# Patient Record
Sex: Male | Born: 1965 | Race: White | Hispanic: No | Marital: Married | Smoking: Former smoker
Health system: Southern US, Community
[De-identification: ages and names within clinical notes are randomized; demographics above are authoritative.]

## PROBLEM LIST (undated history)

## (undated) DIAGNOSIS — H04123 Dry eye syndrome of bilateral lacrimal glands: Secondary | ICD-10-CM

## (undated) DIAGNOSIS — M1990 Unspecified osteoarthritis, unspecified site: Secondary | ICD-10-CM

## (undated) DIAGNOSIS — R112 Nausea with vomiting, unspecified: Secondary | ICD-10-CM

## (undated) DIAGNOSIS — K5120 Ulcerative (chronic) proctitis without complications: Secondary | ICD-10-CM

## (undated) DIAGNOSIS — E559 Vitamin D deficiency, unspecified: Secondary | ICD-10-CM

## (undated) DIAGNOSIS — Z9889 Other specified postprocedural states: Secondary | ICD-10-CM

## (undated) DIAGNOSIS — J45909 Unspecified asthma, uncomplicated: Secondary | ICD-10-CM

## (undated) DIAGNOSIS — R519 Headache, unspecified: Secondary | ICD-10-CM

## (undated) DIAGNOSIS — K2270 Barrett's esophagus without dysplasia: Secondary | ICD-10-CM

## (undated) DIAGNOSIS — K648 Other hemorrhoids: Secondary | ICD-10-CM

## (undated) DIAGNOSIS — A4902 Methicillin resistant Staphylococcus aureus infection, unspecified site: Secondary | ICD-10-CM

## (undated) DIAGNOSIS — B1910 Unspecified viral hepatitis B without hepatic coma: Secondary | ICD-10-CM

## (undated) DIAGNOSIS — I10 Essential (primary) hypertension: Secondary | ICD-10-CM

## (undated) DIAGNOSIS — F419 Anxiety disorder, unspecified: Secondary | ICD-10-CM

## (undated) DIAGNOSIS — N342 Other urethritis: Secondary | ICD-10-CM

## (undated) DIAGNOSIS — E039 Hypothyroidism, unspecified: Secondary | ICD-10-CM

## (undated) DIAGNOSIS — K219 Gastro-esophageal reflux disease without esophagitis: Secondary | ICD-10-CM

## (undated) HISTORY — DX: Dry eye syndrome of bilateral lacrimal glands: H04.123

## (undated) HISTORY — DX: Hypothyroidism, unspecified: E03.9

## (undated) HISTORY — PX: COLONOSCOPY: SHX174

## (undated) HISTORY — PX: ESOPHAGOGASTRODUODENOSCOPY: SHX1529

## (undated) HISTORY — DX: Other hemorrhoids: K64.8

## (undated) HISTORY — DX: Other urethritis: N34.2

## (undated) HISTORY — PX: HEMORRHOID BANDING: SHX5850

## (undated) HISTORY — DX: Ulcerative (chronic) proctitis without complications: K51.20

## (undated) HISTORY — DX: Gastro-esophageal reflux disease without esophagitis: K21.9

## (undated) HISTORY — PX: FACIAL COSMETIC SURGERY: SHX629

## (undated) HISTORY — DX: Barrett's esophagus without dysplasia: K22.70

## (undated) HISTORY — DX: Anxiety disorder, unspecified: F41.9

## (undated) HISTORY — DX: Unspecified asthma, uncomplicated: J45.909

## (undated) HISTORY — DX: Vitamin D deficiency, unspecified: E55.9

## (undated) HISTORY — PX: LIPOSUCTION: SHX10

## (undated) HISTORY — DX: Unspecified viral hepatitis B without hepatic coma: B19.10

---

## 1998-07-25 ENCOUNTER — Ambulatory Visit (HOSPITAL_BASED_OUTPATIENT_CLINIC_OR_DEPARTMENT_OTHER): Admission: RE | Admit: 1998-07-25 | Discharge: 1998-07-25 | Payer: Self-pay | Admitting: *Deleted

## 2002-10-19 LAB — HM COLONOSCOPY

## 2004-05-15 ENCOUNTER — Ambulatory Visit: Payer: Self-pay | Admitting: Internal Medicine

## 2004-05-29 ENCOUNTER — Ambulatory Visit: Payer: Self-pay | Admitting: Internal Medicine

## 2004-10-24 ENCOUNTER — Ambulatory Visit: Payer: Self-pay | Admitting: Internal Medicine

## 2004-10-27 ENCOUNTER — Ambulatory Visit: Payer: Self-pay | Admitting: Internal Medicine

## 2004-10-31 ENCOUNTER — Ambulatory Visit: Payer: Self-pay | Admitting: Internal Medicine

## 2004-11-01 ENCOUNTER — Ambulatory Visit: Payer: Self-pay | Admitting: Internal Medicine

## 2004-11-02 ENCOUNTER — Ambulatory Visit: Payer: Self-pay | Admitting: Internal Medicine

## 2004-11-09 ENCOUNTER — Ambulatory Visit: Payer: Self-pay | Admitting: Internal Medicine

## 2004-11-14 ENCOUNTER — Ambulatory Visit: Payer: Self-pay | Admitting: Internal Medicine

## 2004-11-20 ENCOUNTER — Ambulatory Visit: Payer: Self-pay | Admitting: Internal Medicine

## 2004-11-27 ENCOUNTER — Ambulatory Visit: Payer: Self-pay | Admitting: Internal Medicine

## 2004-12-11 ENCOUNTER — Ambulatory Visit: Payer: Self-pay | Admitting: Internal Medicine

## 2005-01-01 ENCOUNTER — Ambulatory Visit: Payer: Self-pay | Admitting: Internal Medicine

## 2005-03-05 ENCOUNTER — Ambulatory Visit: Payer: Self-pay | Admitting: Internal Medicine

## 2005-03-12 ENCOUNTER — Ambulatory Visit: Payer: Self-pay | Admitting: Internal Medicine

## 2005-05-14 ENCOUNTER — Ambulatory Visit: Payer: Self-pay | Admitting: Internal Medicine

## 2005-05-22 ENCOUNTER — Ambulatory Visit: Payer: Self-pay | Admitting: Internal Medicine

## 2005-11-19 ENCOUNTER — Ambulatory Visit: Payer: Self-pay | Admitting: Internal Medicine

## 2005-12-03 ENCOUNTER — Ambulatory Visit: Payer: Self-pay | Admitting: Internal Medicine

## 2005-12-17 ENCOUNTER — Ambulatory Visit: Payer: Self-pay | Admitting: Internal Medicine

## 2006-06-18 DIAGNOSIS — B1910 Unspecified viral hepatitis B without hepatic coma: Secondary | ICD-10-CM

## 2006-06-18 HISTORY — DX: Unspecified viral hepatitis B without hepatic coma: B19.10

## 2006-11-25 ENCOUNTER — Ambulatory Visit: Payer: Self-pay | Admitting: Internal Medicine

## 2006-11-26 ENCOUNTER — Encounter: Payer: Self-pay | Admitting: Internal Medicine

## 2006-12-02 ENCOUNTER — Ambulatory Visit: Payer: Self-pay | Admitting: Internal Medicine

## 2006-12-02 LAB — CONVERTED CEMR LAB
Alkaline Phosphatase: 46 units/L (ref 39–117)
Basophils Absolute: 0 10*3/uL (ref 0.0–0.1)
Bilirubin Urine: NEGATIVE
Eosinophils Absolute: 0.1 10*3/uL (ref 0.0–0.6)
Eosinophils Relative: 3.1 % (ref 0.0–5.0)
Glucose, Bld: 88 mg/dL (ref 70–99)
HCT: 40.7 % (ref 39.0–52.0)
MCHC: 35 g/dL (ref 30.0–36.0)
Monocytes Absolute: 0.3 10*3/uL (ref 0.2–0.7)
Monocytes Relative: 7.9 % (ref 3.0–11.0)
Platelets: 208 10*3/uL (ref 150–400)
Potassium: 4.5 meq/L (ref 3.5–5.1)
RDW: 11.9 % (ref 11.5–14.6)
Specific Gravity, Urine: >=1.03 (ref 1.000–1.03)
TSH: 1.36 microintl units/mL (ref 0.35–5.50)
Total Bilirubin: 1.1 mg/dL (ref 0.3–1.2)
Total Protein, Urine: NEGATIVE mg/dL
Urine Glucose: NEGATIVE mg/dL
WBC: 4.1 10*3/uL — ABNORMAL LOW (ref 4.5–10.5)
pH: 6 (ref 5.0–8.0)

## 2006-12-09 ENCOUNTER — Ambulatory Visit: Payer: Self-pay | Admitting: Internal Medicine

## 2007-01-21 ENCOUNTER — Encounter: Payer: Self-pay | Admitting: Internal Medicine

## 2007-01-21 DIAGNOSIS — J309 Allergic rhinitis, unspecified: Secondary | ICD-10-CM | POA: Insufficient documentation

## 2007-01-21 DIAGNOSIS — K439 Ventral hernia without obstruction or gangrene: Secondary | ICD-10-CM | POA: Insufficient documentation

## 2007-01-21 DIAGNOSIS — B1910 Unspecified viral hepatitis B without hepatic coma: Secondary | ICD-10-CM | POA: Insufficient documentation

## 2007-01-21 DIAGNOSIS — B957 Other staphylococcus as the cause of diseases classified elsewhere: Secondary | ICD-10-CM | POA: Insufficient documentation

## 2007-01-21 DIAGNOSIS — K429 Umbilical hernia without obstruction or gangrene: Secondary | ICD-10-CM | POA: Insufficient documentation

## 2007-05-30 ENCOUNTER — Telehealth: Payer: Self-pay | Admitting: Internal Medicine

## 2007-06-19 HISTORY — PX: TEAR DUCT PROBING: SHX793

## 2007-09-29 ENCOUNTER — Encounter: Payer: Self-pay | Admitting: Internal Medicine

## 2007-11-03 ENCOUNTER — Telehealth: Payer: Self-pay | Admitting: Internal Medicine

## 2007-12-08 ENCOUNTER — Ambulatory Visit: Payer: Self-pay | Admitting: Internal Medicine

## 2007-12-09 LAB — CONVERTED CEMR LAB
Albumin: 4.1 g/dL (ref 3.5–5.2)
Bilirubin, Direct: 0.2 mg/dL (ref 0.0–0.3)
CO2: 31 meq/L (ref 19–32)
Creatinine, Ser: 1 mg/dL (ref 0.4–1.5)
Glucose, Bld: 96 mg/dL (ref 70–99)
HCT: 42.6 % (ref 39.0–52.0)
Hemoglobin, Urine: NEGATIVE
Hemoglobin: 14.8 g/dL (ref 13.0–17.0)
Ketones, ur: NEGATIVE mg/dL
LDL Cholesterol: 107 mg/dL — ABNORMAL HIGH (ref 0–99)
Lymphocytes Relative: 37.6 % (ref 12.0–46.0)
MCV: 92.1 fL (ref 78.0–100.0)
Monocytes Relative: 7.7 % (ref 3.0–12.0)
Total Bilirubin: 1.1 mg/dL (ref 0.3–1.2)
Total Protein, Urine: NEGATIVE mg/dL
Total Protein: 7 g/dL (ref 6.0–8.3)
Triglycerides: 31 mg/dL (ref 0–149)
Urine Glucose: NEGATIVE mg/dL
Urobilinogen, UA: 0.2 (ref 0.0–1.0)
VLDL: 6 mg/dL (ref 0–40)
WBC: 3.4 10*3/uL — ABNORMAL LOW (ref 4.5–10.5)

## 2007-12-11 LAB — CONVERTED CEMR LAB: Hep B S Ab: POSITIVE — AB

## 2007-12-17 ENCOUNTER — Telehealth (INDEPENDENT_AMBULATORY_CARE_PROVIDER_SITE_OTHER): Payer: Self-pay | Admitting: *Deleted

## 2007-12-29 ENCOUNTER — Ambulatory Visit: Payer: Self-pay | Admitting: Sports Medicine

## 2007-12-29 DIAGNOSIS — M25569 Pain in unspecified knee: Secondary | ICD-10-CM | POA: Insufficient documentation

## 2008-01-08 ENCOUNTER — Ambulatory Visit: Payer: Self-pay | Admitting: Internal Medicine

## 2008-05-25 ENCOUNTER — Telehealth: Payer: Self-pay | Admitting: Internal Medicine

## 2008-06-18 DIAGNOSIS — N342 Other urethritis: Secondary | ICD-10-CM

## 2008-06-18 DIAGNOSIS — E039 Hypothyroidism, unspecified: Secondary | ICD-10-CM

## 2008-06-18 HISTORY — DX: Other urethritis: N34.2

## 2008-06-18 HISTORY — DX: Hypothyroidism, unspecified: E03.9

## 2008-08-23 ENCOUNTER — Ambulatory Visit: Payer: Self-pay | Admitting: Internal Medicine

## 2008-08-23 DIAGNOSIS — M549 Dorsalgia, unspecified: Secondary | ICD-10-CM | POA: Insufficient documentation

## 2008-08-23 LAB — CONVERTED CEMR LAB
BUN: 16 mg/dL (ref 6–23)
Calcium: 9.5 mg/dL (ref 8.4–10.5)
Creatinine, Ser: 0.9 mg/dL (ref 0.4–1.5)
Eosinophils Relative: 2.5 % (ref 0.0–5.0)
GFR calc Af Amer: 118 mL/min
GFR calc non Af Amer: 98 mL/min
HCT: 43.4 % (ref 39.0–52.0)
Ketones, ur: NEGATIVE mg/dL
Leukocytes, UA: NEGATIVE
MCHC: 35.2 g/dL (ref 30.0–36.0)
Monocytes Absolute: 0.3 10*3/uL (ref 0.1–1.0)
Nitrite: NEGATIVE
Potassium: 5 meq/L (ref 3.5–5.1)
RBC: 4.66 M/uL (ref 4.22–5.81)
Specific Gravity, Urine: 1.01 (ref 1.000–1.035)
Urine Glucose: NEGATIVE mg/dL
WBC: 3.9 10*3/uL — ABNORMAL LOW (ref 4.5–10.5)

## 2008-08-30 ENCOUNTER — Ambulatory Visit: Payer: Self-pay | Admitting: Cardiology

## 2008-09-06 ENCOUNTER — Encounter: Payer: Self-pay | Admitting: Internal Medicine

## 2008-09-09 ENCOUNTER — Telehealth: Payer: Self-pay | Admitting: Internal Medicine

## 2008-09-13 ENCOUNTER — Telehealth (INDEPENDENT_AMBULATORY_CARE_PROVIDER_SITE_OTHER): Payer: Self-pay | Admitting: *Deleted

## 2008-09-15 ENCOUNTER — Encounter: Admission: RE | Admit: 2008-09-15 | Discharge: 2008-09-15 | Payer: Self-pay | Admitting: Internal Medicine

## 2008-10-14 ENCOUNTER — Telehealth: Payer: Self-pay | Admitting: Internal Medicine

## 2008-10-25 ENCOUNTER — Ambulatory Visit: Payer: Self-pay | Admitting: Internal Medicine

## 2008-10-25 DIAGNOSIS — E039 Hypothyroidism, unspecified: Secondary | ICD-10-CM | POA: Insufficient documentation

## 2008-10-27 ENCOUNTER — Telehealth: Payer: Self-pay | Admitting: Internal Medicine

## 2008-11-02 ENCOUNTER — Telehealth: Payer: Self-pay | Admitting: Internal Medicine

## 2008-11-10 ENCOUNTER — Telehealth: Payer: Self-pay | Admitting: Internal Medicine

## 2008-11-18 ENCOUNTER — Telehealth: Payer: Self-pay | Admitting: Internal Medicine

## 2008-11-22 ENCOUNTER — Encounter: Payer: Self-pay | Admitting: Internal Medicine

## 2008-12-13 ENCOUNTER — Ambulatory Visit: Payer: Self-pay | Admitting: Sports Medicine

## 2009-01-04 ENCOUNTER — Telehealth (INDEPENDENT_AMBULATORY_CARE_PROVIDER_SITE_OTHER): Payer: Self-pay | Admitting: *Deleted

## 2009-01-10 ENCOUNTER — Ambulatory Visit: Payer: Self-pay | Admitting: Internal Medicine

## 2009-01-10 LAB — CONVERTED CEMR LAB
Basophils Absolute: 0 10*3/uL (ref 0.0–0.1)
Cholesterol: 180 mg/dL (ref 0–200)
Eosinophils Absolute: 0.1 10*3/uL (ref 0.0–0.7)
Eosinophils Relative: 3.2 % (ref 0.0–5.0)
GFR calc non Af Amer: 86.32 mL/min (ref >60)
HCT: 44.3 % (ref 39.0–52.0)
HDL: 64.3 mg/dL (ref >39.00)
Hemoglobin, Urine: NEGATIVE
Ketones, ur: NEGATIVE mg/dL
LDL Cholesterol: 111 mg/dL — ABNORMAL HIGH (ref 0–99)
Lymphocytes Relative: 41.8 % (ref 12.0–46.0)
Lymphs Abs: 1.3 10*3/uL (ref 0.7–4.0)
Monocytes Relative: 8.4 % (ref 3.0–12.0)
Neutro Abs: 1.5 10*3/uL (ref 1.4–7.7)
Neutrophils Relative %: 46 % (ref 43.0–77.0)
Nitrite: NEGATIVE
Potassium: 4.2 meq/L (ref 3.5–5.1)
RBC: 4.71 M/uL (ref 4.22–5.81)
Sodium: 141 meq/L (ref 135–145)
TSH: 0.56 microintl units/mL (ref 0.35–5.50)
Total CHOL/HDL Ratio: 3
Total Protein, Urine: NEGATIVE mg/dL
Triglycerides: 24 mg/dL (ref 0.0–149.0)
VLDL: 4.8 mg/dL (ref 0.0–40.0)

## 2009-01-17 ENCOUNTER — Ambulatory Visit: Payer: Self-pay | Admitting: Internal Medicine

## 2009-01-20 ENCOUNTER — Telehealth: Payer: Self-pay | Admitting: Internal Medicine

## 2009-02-24 ENCOUNTER — Ambulatory Visit: Payer: Self-pay | Admitting: Internal Medicine

## 2009-02-24 DIAGNOSIS — F411 Generalized anxiety disorder: Secondary | ICD-10-CM | POA: Insufficient documentation

## 2009-02-24 DIAGNOSIS — F419 Anxiety disorder, unspecified: Secondary | ICD-10-CM | POA: Insufficient documentation

## 2009-02-25 LAB — CONVERTED CEMR LAB
Cholesterol: 193 mg/dL (ref 0–200)
HDL: 65 mg/dL (ref >39.00)
LDL Cholesterol: 120 mg/dL — ABNORMAL HIGH (ref 0–99)
Total CHOL/HDL Ratio: 3
VLDL: 8 mg/dL (ref 0.0–40.0)

## 2009-03-09 ENCOUNTER — Ambulatory Visit: Payer: Self-pay | Admitting: Internal Medicine

## 2009-03-09 DIAGNOSIS — J45909 Unspecified asthma, uncomplicated: Secondary | ICD-10-CM | POA: Insufficient documentation

## 2009-03-09 DIAGNOSIS — R0609 Other forms of dyspnea: Secondary | ICD-10-CM

## 2009-03-09 DIAGNOSIS — R0989 Other specified symptoms and signs involving the circulatory and respiratory systems: Secondary | ICD-10-CM

## 2009-03-16 ENCOUNTER — Ambulatory Visit: Payer: Self-pay | Admitting: Sports Medicine

## 2009-03-16 DIAGNOSIS — M2140 Flat foot [pes planus] (acquired), unspecified foot: Secondary | ICD-10-CM | POA: Insufficient documentation

## 2009-05-16 ENCOUNTER — Ambulatory Visit: Payer: Self-pay | Admitting: Internal Medicine

## 2009-09-13 ENCOUNTER — Ambulatory Visit: Payer: Self-pay | Admitting: Internal Medicine

## 2009-09-13 DIAGNOSIS — K219 Gastro-esophageal reflux disease without esophagitis: Secondary | ICD-10-CM | POA: Insufficient documentation

## 2009-09-14 LAB — CONVERTED CEMR LAB
Bilirubin Urine: NEGATIVE
Leukocytes, UA: NEGATIVE
Specific Gravity, Urine: 1.015 (ref 1.000–1.030)
Total Protein, Urine: NEGATIVE mg/dL
Urine Glucose: NEGATIVE mg/dL

## 2009-09-19 ENCOUNTER — Encounter: Admission: RE | Admit: 2009-09-19 | Discharge: 2009-09-19 | Payer: Self-pay | Admitting: Internal Medicine

## 2009-11-09 ENCOUNTER — Telehealth: Payer: Self-pay | Admitting: Internal Medicine

## 2009-11-16 ENCOUNTER — Telehealth: Payer: Self-pay | Admitting: Internal Medicine

## 2009-11-24 ENCOUNTER — Telehealth: Payer: Self-pay | Admitting: Internal Medicine

## 2009-11-24 ENCOUNTER — Ambulatory Visit: Payer: Self-pay | Admitting: Internal Medicine

## 2009-11-24 DIAGNOSIS — B009 Herpesviral infection, unspecified: Secondary | ICD-10-CM | POA: Insufficient documentation

## 2009-11-24 DIAGNOSIS — F329 Major depressive disorder, single episode, unspecified: Secondary | ICD-10-CM

## 2009-11-24 DIAGNOSIS — F3289 Other specified depressive episodes: Secondary | ICD-10-CM | POA: Insufficient documentation

## 2009-11-24 DIAGNOSIS — F410 Panic disorder [episodic paroxysmal anxiety] without agoraphobia: Secondary | ICD-10-CM | POA: Insufficient documentation

## 2009-11-25 ENCOUNTER — Telehealth: Payer: Self-pay | Admitting: Internal Medicine

## 2009-11-28 ENCOUNTER — Ambulatory Visit: Payer: Self-pay | Admitting: Internal Medicine

## 2009-11-28 ENCOUNTER — Telehealth: Payer: Self-pay | Admitting: Internal Medicine

## 2009-11-28 LAB — CONVERTED CEMR LAB: Glucose, Bld: 46 mg/dL — CL (ref 70–99)

## 2009-11-29 ENCOUNTER — Telehealth: Payer: Self-pay | Admitting: Internal Medicine

## 2009-12-01 ENCOUNTER — Telehealth: Payer: Self-pay | Admitting: Internal Medicine

## 2009-12-01 ENCOUNTER — Ambulatory Visit: Payer: Self-pay | Admitting: Internal Medicine

## 2009-12-01 DIAGNOSIS — E291 Testicular hypofunction: Secondary | ICD-10-CM | POA: Insufficient documentation

## 2009-12-01 DIAGNOSIS — M545 Low back pain, unspecified: Secondary | ICD-10-CM | POA: Insufficient documentation

## 2009-12-01 DIAGNOSIS — E162 Hypoglycemia, unspecified: Secondary | ICD-10-CM

## 2009-12-02 ENCOUNTER — Ambulatory Visit: Payer: Self-pay | Admitting: Internal Medicine

## 2009-12-07 LAB — CONVERTED CEMR LAB
ALT: 13 units/L (ref 0–53)
Bilirubin, Direct: 0.1 mg/dL (ref 0.0–0.3)
Creatinine, Ser: 0.9 mg/dL (ref 0.4–1.5)
Glucose, Bld: 95 mg/dL (ref 70–99)
Hgb A1c MFr Bld: 5.3 % (ref 4.6–6.5)
Total Bilirubin: 1.1 mg/dL (ref 0.3–1.2)
Total Protein: 6.7 g/dL (ref 6.0–8.3)

## 2010-01-16 ENCOUNTER — Ambulatory Visit: Payer: Self-pay | Admitting: Internal Medicine

## 2010-01-16 LAB — CONVERTED CEMR LAB
AST: 20 units/L (ref 0–37)
Albumin: 4.1 g/dL (ref 3.5–5.2)
BUN: 17 mg/dL (ref 6–23)
Basophils Absolute: 0 10*3/uL (ref 0.0–0.1)
Basophils Relative: 0.6 % (ref 0.0–3.0)
Bilirubin, Direct: 0.2 mg/dL (ref 0.0–0.3)
Chloride: 106 meq/L (ref 96–112)
Eosinophils Absolute: 0.1 10*3/uL (ref 0.0–0.7)
GFR calc non Af Amer: 105.07 mL/min (ref >60)
HCT: 43 % (ref 39.0–52.0)
Hemoglobin: 15.2 g/dL (ref 13.0–17.0)
Ketones, ur: NEGATIVE mg/dL
Lymphs Abs: 1.5 10*3/uL (ref 0.7–4.0)
MCV: 92.9 fL (ref 78.0–100.0)
Monocytes Absolute: 0.3 10*3/uL (ref 0.1–1.0)
Neutro Abs: 2.2 10*3/uL (ref 1.4–7.7)
Nitrite: NEGATIVE
PSA: 0.51 ng/mL (ref 0.10–4.00)
Platelets: 193 10*3/uL (ref 150.0–400.0)
Potassium: 4.5 meq/L (ref 3.5–5.1)
RBC: 4.63 M/uL (ref 4.22–5.81)
Sodium: 140 meq/L (ref 135–145)
Total Bilirubin: 1.2 mg/dL (ref 0.3–1.2)
Total CHOL/HDL Ratio: 3
Total Protein, Urine: NEGATIVE mg/dL
Urine Glucose: NEGATIVE mg/dL
Urobilinogen, UA: 0.2 (ref 0.0–1.0)
WBC: 4.1 10*3/uL — ABNORMAL LOW (ref 4.5–10.5)

## 2010-01-23 ENCOUNTER — Encounter: Payer: Self-pay | Admitting: Internal Medicine

## 2010-01-23 ENCOUNTER — Ambulatory Visit: Payer: Self-pay | Admitting: Internal Medicine

## 2010-04-17 ENCOUNTER — Telehealth: Payer: Self-pay | Admitting: Internal Medicine

## 2010-04-19 ENCOUNTER — Ambulatory Visit: Payer: Self-pay | Admitting: Internal Medicine

## 2010-04-19 LAB — CONVERTED CEMR LAB
HCV Ab: NEGATIVE
Hep B C IgM: NEGATIVE
Hepatitis B Surface Ag: NEGATIVE

## 2010-05-01 ENCOUNTER — Telehealth: Payer: Self-pay | Admitting: Internal Medicine

## 2010-05-03 ENCOUNTER — Encounter: Payer: Self-pay | Admitting: Internal Medicine

## 2010-05-22 ENCOUNTER — Ambulatory Visit: Payer: Self-pay | Admitting: Internal Medicine

## 2010-05-22 DIAGNOSIS — K649 Unspecified hemorrhoids: Secondary | ICD-10-CM | POA: Insufficient documentation

## 2010-05-22 DIAGNOSIS — K921 Melena: Secondary | ICD-10-CM

## 2010-05-22 DIAGNOSIS — K6289 Other specified diseases of anus and rectum: Secondary | ICD-10-CM

## 2010-05-29 ENCOUNTER — Telehealth: Payer: Self-pay | Admitting: Internal Medicine

## 2010-06-13 ENCOUNTER — Ambulatory Visit: Payer: Self-pay | Admitting: Internal Medicine

## 2010-06-13 DIAGNOSIS — R1084 Generalized abdominal pain: Secondary | ICD-10-CM | POA: Insufficient documentation

## 2010-06-15 LAB — CONVERTED CEMR LAB
Blood, UA: NEGATIVE
Ketones, ur: NEGATIVE mg/dL
Total Protein, Urine: NEGATIVE mg/dL
Urine Glucose: NEGATIVE mg/dL
pH: 7 (ref 5.0–8.0)

## 2010-06-18 DIAGNOSIS — Z860101 Personal history of adenomatous and serrated colon polyps: Secondary | ICD-10-CM

## 2010-06-18 DIAGNOSIS — Z86010 Personal history of colonic polyps: Secondary | ICD-10-CM

## 2010-06-18 HISTORY — DX: Personal history of colonic polyps: Z86.010

## 2010-06-18 HISTORY — DX: Personal history of adenomatous and serrated colon polyps: Z86.0101

## 2010-06-21 ENCOUNTER — Telehealth: Payer: Self-pay | Admitting: Internal Medicine

## 2010-07-04 ENCOUNTER — Telehealth: Payer: Self-pay | Admitting: Internal Medicine

## 2010-07-18 NOTE — Assessment & Plan Note (Signed)
Summary: LOW GLUCOSE--PER KELLY SCHED--STC   Vital Signs:  Patient profile:   45 year old male Height:      73 inches Weight:      182.25 pounds BMI:     24.13 O2 Sat:      96 % on Room air Temp:     97.6 degrees F oral Pulse rate:   77 / minute BP sitting:   134 / 62  (left arm) Cuff size:   regular  Vitals Entered By: Lucious Groves (December 01, 2009 11:08 AM)  O2 Flow:  Room air CC: OV per MD--low glucose./kb Is Patient Diabetic? No Pain Assessment Patient in pain? no      Comments Patient notes that he is not taking Loratadine or Proair./kb   Primary Care Provider:  Tresa Garter MD  CC:  OV per MD--low glucose./kb.  History of Present Illness: C/o dizzy spells at times "low CBG". C/o R flank pain, anxiety, stress C/o depressed mood, poor focus  Current Medications (verified): 1)  Nexium 40 Mg Cpdr (Esomeprazole Magnesium) .... Take 1 Tab Each Morning 2)  Restasis 0.05 % Emul (Cyclosporine) .... Two Times A Day in B Eyes 3)  Loratadine 10 Mg  Tabs (Loratadine) .... Once Daily As Needed Allergies 4)  Proair Hfa 108 (90 Base) Mcg/act  Aers (Albuterol Sulfate) .... 2 Inh Q4h As Needed Shortness of Breath 5)  Nasonex 50 Mcg/act Susp (Mometasone Furoate) .Marland Kitchen.. 1-2 Each Nostril Qd 6)  Meclizine Hcl 12.5 Mg Tabs (Meclizine Hcl) .Marland Kitchen.. 1 - 2 By Mouth Q 6 Hrs As Needed 7)  Alprazolam 0.25 Mg Tabs (Alprazolam) .Marland Kitchen.. 1po  Two Times A Day As Needed 8)  Sertraline Hcl 50 Mg Tabs (Sertraline Hcl) .... One By Mouth Once Daily  Allergies (verified): 1)  ! Codeine 2)  ! Penicillin  Past History:  Past Medical History: Last updated: 09/13/2009 Allergic rhinitis Hep B recovered 2008 Hypothyroidism 2010 Dr Raquel Sarna ? urethritis 2010 Dry eyes Anxiety Asthmatic bronchitis GERD  Past Surgical History: Last updated: 01/08/2008 Liposuction Face Lift Tear duct plugs 2009  Social History: Last updated: 11/24/2009 Single Occupation: hairdresser Former Smoker Regular  exercise-yes Drug use-no  Review of Systems  The patient denies fever, chest pain, syncope, dyspnea on exertion, and abdominal pain.    Physical Exam  General:  alert, well-developed, well-nourished, well-hydrated, appropriate dress, normal appearance, healthy-appearing, and cooperative to examination.   Nose:  External nasal examination shows no deformity or inflammation. Nasal mucosa are pink and moist without lesions or exudates. Mouth:  he has 3 excoriations along the right upper vermilion border with mild eryhtema but no exudate, induration, streaking, or vesicles Neck:  supple and no masses.   Lungs:  normal respiratory effort and normal breath sounds.   Heart:  normal rate and regular rhythm.   Abdomen:  Bowel sounds positive,abdomen soft and non-tender without masses, organomegaly. Small midline hernia noted. Msk:  normal ROM, no joint tenderness, no joint swelling, no joint warmth, no redness over joints, no joint deformities, no joint instability, and no crepitation.   Extremities:  No clubbing, cyanosis, edema, or deformity noted with normal full range of motion of all joints.   Neurologic:  No cranial nerve deficits noted. Station and gait are normal. Plantar reflexes are down-going bilaterally. DTRs are symmetrical throughout. Sensory, motor and coordinative functions appear intact. Skin:  turgor normal, no rashes, no ecchymoses, no petechiae, no purpura, no ulcerations, and no edema.   Psych:  Oriented X3, memory  intact for recent and remote, good eye contact, not agitated, not suicidal, not homicidal, dysphoric affect, tearful, and moderately anxious.     Impression & Recommendations:  Problem # 1:  HYPOGLYCEMIA (ICD-251.2) one incidental mesurement of low CBG Assessment New His CBGs at home were in 70-130 range - fasting and postprandial See "Patient Instructions" for labs. Within past 12 months he had a CT abd, [place] abd and LS MRI. He was asked to stop 5HTP, B12,   Turmeric  Problem # 2:  PANIC DISORDER (ICD-300.01) Assessment: Deteriorated  The following medications were removed from the medication list:    Sertraline Hcl 50 Mg Tabs (Sertraline hcl) ..... One by mouth once daily - he never took it His updated medication list for this problem includes:    Alprazolam 0.25 Mg Tabs (Alprazolam) .Marland Kitchen... 1po  two times a day as needed    Wellbutrin Sr 100 Mg Xr12h-tab (Bupropion hcl) .Marland Kitchen... 1 by mouth bid  Problem # 3:  DEPRESSIVE DISORDER (ICD-311) Assessment: Unchanged  The following medications were removed from the medication list:    Sertraline Hcl 50 Mg Tabs (Sertraline hcl) ..... One by mouth once daily His updated medication list for this problem includes:    Alprazolam 0.25 Mg Tabs (Alprazolam) .Marland Kitchen... 1po  two times a day as needed    Wellbutrin Sr 100 Mg Xr12h-tab (Bupropion hcl) .Marland Kitchen... 1 by mouth bid  Problem # 4:  LOW BACK PAIN, ACUTE (ICD-724.2) L flank Assessment: Deteriorated MSK due to postural issues at work See "Patient Instructions". He had a CT, [place], labs  Complete Medication List: 1)  Nexium 40 Mg Cpdr (Esomeprazole magnesium) .... Take 1 tab each morning 2)  Restasis 0.05 % Emul (Cyclosporine) .... Two times a day in b eyes 3)  Loratadine 10 Mg Tabs (Loratadine) .... Once daily as needed allergies 4)  Proair Hfa 108 (90 Base) Mcg/act Aers (Albuterol sulfate) .... 2 inh q4h as needed shortness of breath 5)  Nasonex 50 Mcg/act Susp (Mometasone furoate) .Marland Kitchen.. 1-2 each nostril qd 6)  Meclizine Hcl 12.5 Mg Tabs (Meclizine hcl) .Marland Kitchen.. 1 - 2 by mouth q 6 hrs as needed 7)  Alprazolam 0.25 Mg Tabs (Alprazolam) .Marland Kitchen.. 1po  two times a day as needed 8)  Wellbutrin Sr 100 Mg Xr12h-tab (Bupropion hcl) .Marland Kitchen.. 1 by mouth bid   Patient Instructions: 1)  Please schedule a follow-up appointment in 1 month. 2)  Can try a Valerian root 3)  Start taking a  yoga class - stretch 4)  Tomorrow: 5)  BMP prior to visit, ICD-9: 6)  Hepatic Panel prior to  visit, ICD-9: 7)  TSH prior to visit, ICD-9: 8)  HbgA1C prior to visit, ICD-9: 9)  Insulin level  995.20 Prescriptions: WELLBUTRIN SR 100 MG XR12H-TAB (BUPROPION HCL) 1 by mouth bid  #60 x 6   Entered and Authorized by:   Tresa Garter MD   Signed by:   Tresa Garter MD on 12/01/2009   Method used:   Electronically to        [facility]* (retail)       [address] [place], [place]  [address]       Ph: [phone]       Fax: [phone]   RxID:   [id number]

## 2010-07-18 NOTE — Progress Notes (Signed)
Summary: REQ FOR RX  Phone Note Call from Patient Call back at [facility] Phone [phone]   Summary of Call: Pt c/o sinus pressure, chest congestion w/ yellow mucus and low grade fever. He has been taking mucinex D and then tried sudafed w/little relief. Patient is requesting rx for antibiotic.  Initial call taken by: Lamar Sprinkles, CMA,  Nov 09, 2009 8:21 AM  Follow-up for Phone Call        ok z pac OV if sick Follow-up by: Tresa Garter MD,  Nov 09, 2009 12:51 PM  Additional Follow-up for Phone Call Additional follow up Details #1::        Pt informed  Additional Follow-up by: Lamar Sprinkles, CMA,  Nov 09, 2009 1:27 PM    New/Updated Medications: ZITHROMAX Z-PAK 250 MG TABS (AZITHROMYCIN) as dirrected Prescriptions: ZITHROMAX Z-PAK 250 MG TABS (AZITHROMYCIN) as dirrected  #1 x 0   Entered and Authorized by:   Tresa Garter MD   Signed by:   Lamar Sprinkles, CMA on 11/09/2009   Method used:   Electronically to        [facility]* (retail)       [address] [place], [place]  [address]       Ph: [phone]       Fax: [phone]   RxID:   [id number]

## 2010-07-18 NOTE — Progress Notes (Signed)
Summary: LOW CBGs  Phone Note Call from Patient   Summary of Call: Pt called back regarding low cbgs. He continues to be very concerned. Advised more frequent smaller meals. Also gave patient a glucometer and explained how to use it. He will check cbgs in am and other times when feeling badly. Pt will bring record in for office visit on thursday. Gave info from [facility] regarding hypoglycemia in non-diabetic patients.   Pt is very concerned as to cause of this and I advise him to discuss at office visit thursday, pt agreed.  Initial call taken by: Lamar Sprinkles, CMA,  November 29, 2009 12:15 PM  Follow-up for Phone Call        FYI- A1C could not be added, please order when pt is in for office visit if you want it.  Follow-up by: Lamar Sprinkles, CMA,  November 29, 2009 5:08 PM  Additional Follow-up for Phone Call Additional follow up Details #1::        OK Thx Additional Follow-up by: Tresa Garter MD,  November 29, 2009 5:30 PM

## 2010-07-18 NOTE — Progress Notes (Signed)
Summary: Test strips  Phone Note Call from Patient Call back at Home Phone [phone]   Summary of Call: Patient left message on triage requesting samples of one touch ultra test strips (code 25). I made patient aware that we do not have any in office. Per patient request prescription sent to [facility] Initial call taken by: Lucious Groves,  December 01, 2009 1:21 PM    New/Updated Medications: ONETOUCH ULTRA TEST  STRP (GLUCOSE BLOOD) use as directed Prescriptions: ONETOUCH ULTRA TEST  STRP (GLUCOSE BLOOD) use as directed  #100 x 0   Entered by:   Lucious Groves   Authorized by:   Tresa Garter MD   Signed by:   Lucious Groves on 12/01/2009   Method used:   Electronically to        [facility]* (retail)       [address] [place], [place]  [address]       Ph: [phone]       Fax: [phone]   RxID:   [id number]

## 2010-07-18 NOTE — Assessment & Plan Note (Signed)
Summary: BURNING-ITCHY-PAINFUL RECTUM --D/T--- STC   Vital Signs:  Patient profile:   45 year old male Height:      73 inches Weight:      189 pounds BMI:     25.03 Temp:     98.7 degrees F oral Pulse rate:   80 / minute Pulse rhythm:   regular Resp:     16 per minute BP sitting:   130 / 78  (left arm) Cuff size:   regular  Vitals Entered By: Lanier Prude, CMA(AAMA) (May 22, 2010 9:26 AM) CC: rectal pressure and some light blood in stools Is Patient Diabetic? No Comments pt is not taking Loratadine, Proair or nasonex.   Primary Care Provider:  Georgina Quint Plotnikov MD  CC:  rectal pressure and some light blood in stools.  History of Present Illness: C/o rectal discomfort x 3 wks w/some bleeding and stool diameter changes. It is 80% better after using Tuck supp. C/o anxiety - worse  Current Medications (verified): 1)  Nexium 40 Mg Cpdr (Esomeprazole Magnesium) .... Take 1 Tab Each Morning 2)  Restasis 0.05 % Emul (Cyclosporine) .... Two Times A Day in B Eyes 3)  Loratadine 10 Mg  Tabs (Loratadine) .... Once Daily As Needed Allergies 4)  Proair Hfa 108 (90 Base) Mcg/act  Aers (Albuterol Sulfate) .... 2 Inh Q4h As Needed Shortness of Breath 5)  Nasonex 50 Mcg/act Susp (Mometasone Furoate) .Marland Kitchen.. 1-2 Each Nostril Qd 6)  Meclizine Hcl 12.5 Mg Tabs (Meclizine Hcl) .Marland Kitchen.. 1 - 2 By Mouth Q 6 Hrs As Needed 7)  Alprazolam 0.25 Mg Tabs (Alprazolam) .Marland Kitchen.. 1po  Two Times A Day As Needed 8)  Onetouch Ultra Test  Strp (Glucose Blood) .... Use As Directed 9)  Nasonex 50 Mcg/act Susp (Mometasone Furoate) .Marland Kitchen.. 1-2 Spr Each Nostril Qd  Allergies (verified): 1)  ! Codeine 2)  ! Penicillin  Past History:  Past Medical History: Allergic rhinitis Hep B recovered 2008 Hypothyroidism 2010 Dr Raquel Sarna ? urethritis 2010 Dry eyes Anxiety Asthmatic bronchitis GERD Internal hemorrhoids  Past Surgical History: Liposuction Face Lift Tear duct plugs 2009 Colon 2004 Dr  Ewing Schlein  Physical Exam  General:  alert, well-developed, well-nourished, well-hydrated, appropriate dress, normal appearance, healthy-appearing, and cooperative to examination.   Nose:  External nasal examination shows no deformity or inflammation. Nasal mucosa are pink and moist without lesions or exudates. Mouth:  he has 3 excoriations along the right upper vermilion border with mild eryhtema but no exudate, induration, streaking, or vesicles Lungs:  normal respiratory effort and normal breath sounds.   Heart:  normal rate and regular rhythm.   Abdomen:  Bowel sounds positive,abdomen soft and non-tender without masses, organomegaly. Small midline hernia noted. Skin:  Intact without suspicious lesions or rashes Psych:  Oriented X3, memory intact for recent and remote, good eye contact, not agitated, not suicidal, not homicidal,    Impression & Recommendations:  Problem # 1:  RECTAL PAIN (ZOX-096.04) Assessment New  Procedure: Anoscopy Indication: Rectal bleeding Risks and benefits were explained. The pt. was placed in the R decubitus position. Digital rectal exam revealed no masses. Anoscope was introduced w/o difficulties. Upon withdrawl, a carefull look at the mucosa was obtained. Between 6 and 8 o'clock a   12x7     mm int hemorrhoid was present without active bleeding. Impression: Internal hemorrhoid. Disposition: see A&P.  Tolerated well. Complications: none.   Orders: Anoscopy (54098)  Problem # 2:  HEMORRHOIDS (ICD-455.6) Assessment: New See "Patient Instructions".  See Meds. Colonosc if issues  Problem # 3:  HEMATOCHEZIA (ICD-578.1) Assessment: New  as above  Orders: Anoscopy ([address])  Problem # 4:  ANXIETY (ICD-300.00)  His updated medication list for this problem includes:    Alprazolam 0.25 Mg Tabs (Alprazolam) .Marland Kitchen... 1po  two times a day as needed    Citalopram Hydrobromide 10 Mg Tabs (Citalopram hydrobromide) .Marland Kitchen... 1 by mouth once daily for depression  Complete  Medication List: 1)  Nexium 40 Mg Cpdr (Esomeprazole magnesium) .... Take 1 tab each morning 2)  Restasis 0.05 % Emul (Cyclosporine) .... Two times a day in b eyes 3)  Loratadine 10 Mg Tabs (Loratadine) .... Once daily as needed allergies 4)  Proair Hfa 108 (90 Base) Mcg/act Aers (Albuterol sulfate) .... 2 inh q4h as needed shortness of breath 5)  Nasonex 50 Mcg/act Susp (Mometasone furoate) .Marland Kitchen.. 1-2 each nostril qd 6)  Meclizine Hcl 12.5 Mg Tabs (Meclizine hcl) .Marland Kitchen.. 1 - 2 by mouth q 6 hrs as needed 7)  Alprazolam 0.25 Mg Tabs (Alprazolam) .Marland Kitchen.. 1po  two times a day as needed 8)  Onetouch Ultra Test Strp (Glucose blood) .... Use as directed 9)  Nasonex 50 Mcg/act Susp (Mometasone furoate) .Marland Kitchen.. 1-2 spr each nostril qd 10)  Doxycycline Hyclate 100 Mg Caps (Doxycycline hyclate) .Marland Kitchen.. 1 by mouth two times a day with a glass of water 11)  Anusol-hc 25 Mg Supp (Hydrocortisone acetate) .Marland Kitchen.. 1 pr two times a day for hemorrhoids 12)  Citalopram Hydrobromide 10 Mg Tabs (Citalopram hydrobromide) .Marland Kitchen.. 1 by mouth once daily for depression  Patient Instructions: 1)  Call if you are not better in a reasonable amount of time or if worse.  Prescriptions: CITALOPRAM HYDROBROMIDE 10 MG TABS (CITALOPRAM HYDROBROMIDE) 1 by mouth once daily for depression  #30 x 6   Entered and Authorized by:   Tresa Garter MD   Signed by:   Tresa Garter MD on 05/22/2010   Method used:   Electronically to        [facility]* (retail)       [address] [place], [place]  [address]       Ph: [phone]       Fax: [phone]   RxID:   [id number] ANUSOL-HC 25 MG SUPP (HYDROCORTISONE ACETATE) 1 pr two times a day for hemorrhoids  #20 x 3   Entered and Authorized by:   Tresa Garter MD   Signed by:   Tresa Garter MD on 05/22/2010   Method used:   Electronically to        [facility]* (retail)       [address] [place], [place]  [address]       Ph: [phone]       Fax: [phone]   RxID:   [id number] DOXYCYCLINE HYCLATE 100 MG CAPS (DOXYCYCLINE HYCLATE) 1 by mouth two times a day with a glass of water  #20 x 0   Entered and Authorized by:   Tresa Garter MD   Signed by:   Tresa Garter MD on 05/22/2010   Method used:   Electronically to        [facility]* (retail)       [address] [facility]  [place], [place]  [address]       Ph: [phone]       Fax: [phone]   RxID:   [id number]    Orders Added: 1)  Est. Patient Level IV [03474] 2)  Anoscopy [25956]

## 2010-07-18 NOTE — Progress Notes (Signed)
Summary: OV TODAY  Phone Note Call from Patient   Summary of Call: Pt c/o lightheadedness, arm weakness, disoriented somewhat from dizzyness and nervous about symptoms. Pt recently completed zpak for sinus infection. He c/o ear/facial pressure & pain. Is not taking any antihistamines or decongestants. Pt is scheduled for office visit w/Cris Gibby today at 11:15. Pt took 1/2 meclizine which he said gave some relief.  Advised pt to keep apt and call office w/any change in symptoms.  Initial call taken by: Lamar Sprinkles, CMA,  November 24, 2009 10:30 AM

## 2010-07-18 NOTE — Assessment & Plan Note (Signed)
Summary: PAIN IN LEFT SIDE/NWS   Vital Signs:  Patient profile:   45 year old male Weight:      178 pounds Temp:     98.6 degrees F oral Pulse rate:   83 / minute BP sitting:   116 / 64  (left arm)  Vitals Entered By: Tora Perches (September 13, 2009 4:21 PM) CC: left side pain Is Patient Diabetic? No   CC:  left side pain.  History of Present Illness: C/o LUQ pain x  8 wks ago worse w/drinking liquid like a pulled muscle. He is taking a lot of supplements w/coffe in am. No wt loss. He is very anxious about it.  Preventive Screening-Counseling & Management  Alcohol-Tobacco     Smoking Status: quit  Current Medications (verified): 1)  Nexium 40 Mg Cpdr (Esomeprazole Magnesium) .... Take 1 Tab Each Morning 2)  Ibuprofen 600 Mg  Tabs (Ibuprofen) .Marland Kitchen.. 1 By Mouth Two Times A Day As Needed Pain 3)  Restasis 0.05 % Emul (Cyclosporine) .... Two Times A Day in B Eyes 4)  Loratadine 10 Mg  Tabs (Loratadine) .... Once Daily As Needed Allergies 5)  Proair Hfa 108 (90 Base) Mcg/act  Aers (Albuterol Sulfate) .... 2 Inh Q4h As Needed Shortness of Breath 6)  Nasonex 50 Mcg/act Susp (Mometasone Furoate) .Marland Kitchen.. 1-2 Each Nostril Qd 7)  Meclizine Hcl 12.5 Mg Tabs (Meclizine Hcl) .Marland Kitchen.. 1 - 2 By Mouth Q 6 Hrs As Needed 8)  Alprazolam 0.25 Mg Tabs (Alprazolam) .Marland Kitchen.. 1po  Two Times A Day As Needed  Allergies: 1)  ! Codeine 2)  ! Penicillin  Past History:  Past Medical History: Allergic rhinitis Hep B recovered 2008 Hypothyroidism 2010 Dr Raquel Sarna ? urethritis 2010 Dry eyes Anxiety Asthmatic bronchitis GERD  Social History: Reviewed history from 05/16/2009 and no changes required. Single Occupation: hairdresser Former Smoker Regular exercise-yes  Review of Systems  The patient denies fever, weight loss, weight gain, chest pain, and melena.    Physical Exam  General:  alert and well-developed.   Mouth:  pharyngeal erythema and fair dentition.   Lungs:  normal respiratory effort  and normal breath sounds.   Heart:  normal rate and regular rhythm.   Abdomen:  Bowel sounds positive,abdomen soft and non-tender without masses, organomegaly. Small midline hernia noted. Msk:  WNL Neurologic:  cranial nerves II-XII intact, strength normal in all extremities, and finger-to-nose normal.  , no nystagmus Skin:  Intact without suspicious lesions or rashes Psych:  Oriented X3.     Impression & Recommendations:  Problem # 1:  ABDOMINAL PAIN, EPIGASTRIC (ICD-789.06)/LUQ - likely gastritis from vit/supplements Assessment New See "Patient Instructions".  Nexium 40 mg Orders: TLB-Udip ONLY (81003-UDIP) Radiology Referral (Radiology) abd [place]  Problem # 2:  ANXIETY (ICD-300.00) Assessment: Unchanged  His updated medication list for this problem includes:    Alprazolam 0.25 Mg Tabs (Alprazolam) .Marland Kitchen... 1po  two times a day as needed  Problem # 3:  HEPATITIS B (ICD-070.30) Assessment: Comment Only  Problem # 4:  GERD (ICD-530.81) related to #1 Assessment: Deteriorated  His updated medication list for this problem includes:    Nexium 40 Mg Cpdr (Esomeprazole magnesium) .Marland Kitchen... Take 1 tab each morning  Complete Medication List: 1)  Nexium 40 Mg Cpdr (Esomeprazole magnesium) .... Take 1 tab each morning 2)  Restasis 0.05 % Emul (Cyclosporine) .... Two times a day in b eyes 3)  Loratadine 10 Mg Tabs (Loratadine) .... Once daily as needed allergies 4)  Proair  Hfa 108 (90 Base) Mcg/act Aers (Albuterol sulfate) .... 2 inh q4h as needed shortness of breath 5)  Nasonex 50 Mcg/act Susp (Mometasone furoate) .Marland Kitchen.. 1-2 each nostril qd 6)  Meclizine Hcl 12.5 Mg Tabs (Meclizine hcl) .Marland Kitchen.. 1 - 2 by mouth q 6 hrs as needed 7)  Alprazolam 0.25 Mg Tabs (Alprazolam) .Marland Kitchen.. 1po  two times a day as needed  Patient Instructions: 1)  Stop your supplements and vitamins 2)  Take Nexium 40 mg by mouth two times a day x 2 wks then 1 a day 3)  Call if you are not better in a reasonable amount of time or  if worse.  Prescriptions: NEXIUM 40 MG CPDR (ESOMEPRAZOLE MAGNESIUM) Take 1 tab each morning  #30 x 6   Entered and Authorized by:   Tresa Garter MD   Signed by:   Tresa Garter MD on 09/13/2009   Method used:   Print then Give to Patient   RxID:   [id number]

## 2010-07-18 NOTE — Progress Notes (Signed)
Summary: Panic value  Phone Note Other Incoming   Summary of Call: Lab called stating that patient had panic value-- Glucose--46. Please advise. Initial call taken by: Lucious Groves,  November 28, 2009 10:26 AM  Follow-up for Phone Call        Please run A1c on this sample Follow-up by: Tresa Garter MD,  November 28, 2009 1:18 PM  Additional Follow-up for Phone Call Additional follow up Details #1::        see lab comments Additional Follow-up by: Tresa Garter MD,  November 28, 2009 1:20 PM    Additional Follow-up for Phone Call Additional follow up Details #2::    thanks. Follow-up by: Lucious Groves,  November 28, 2009 2:59 PM

## 2010-07-18 NOTE — Assessment & Plan Note (Signed)
Summary: CPX /NWS  #   Vital Signs:  Patient profile:   45 year old male Height:      73 inches Weight:      184 pounds BMI:     24.36 O2 Sat:      97 % on Room air Temp:     98.7 degrees F oral Pulse rate:   71 / minute Pulse rhythm:   regular Resp:     16 per minute BP sitting:   130 / 70  (left arm) Cuff size:   regular  Vitals Entered By: Lanier Prude, CMA(AAMA) (January 23, 2010 10:37 AM)  O2 Flow:  Room air CC: CPX Is Patient Diabetic? No Comments pt is not using Test strips, Loratadine, ProAir, Alprazolam  or Wellbutrin   Primary Care Aryona Sill:  Tresa Garter MD  CC:  CPX.  History of Present Illness: The patient presents for a preventive health examination   Current Medications (verified): 1)  Nexium 40 Mg Cpdr (Esomeprazole Magnesium) .... Take 1 Tab Each Morning 2)  Restasis 0.05 % Emul (Cyclosporine) .... Two Times A Day in B Eyes 3)  Loratadine 10 Mg  Tabs (Loratadine) .... Once Daily As Needed Allergies 4)  Proair Hfa 108 (90 Base) Mcg/act  Aers (Albuterol Sulfate) .... 2 Inh Q4h As Needed Shortness of Breath 5)  Nasonex 50 Mcg/act Susp (Mometasone Furoate) .Marland Kitchen.. 1-2 Each Nostril Qd 6)  Meclizine Hcl 12.5 Mg Tabs (Meclizine Hcl) .Marland Kitchen.. 1 - 2 By Mouth Q 6 Hrs As Needed 7)  Alprazolam 0.25 Mg Tabs (Alprazolam) .Marland Kitchen.. 1po  Two Times A Day As Needed 8)  Wellbutrin Sr 100 Mg Xr12h-Tab (Bupropion Hcl) .Marland Kitchen.. 1 By Mouth Bid 9)  Onetouch Ultra Test  Strp (Glucose Blood) .... Use As Directed  Allergies (verified): 1)  ! Codeine 2)  ! Penicillin  Past History:  Past Medical History: Last updated: 09/13/2009 Allergic rhinitis Hep B recovered 2008 Hypothyroidism 2010 Dr Raquel Sarna ? urethritis 2010 Dry eyes Anxiety Asthmatic bronchitis GERD  Past Surgical History: Last updated: 01/08/2008 Liposuction Face Lift Tear duct plugs 2009  Family History: Last updated: 01/23/2010 Family History Depression Family History Hypertension M died with CVA ?  lupus F well GF CVA  Social History: Last updated: 11/24/2009 Single Occupation: hairdresser Former Smoker Regular exercise-yes Drug use-no  Family History: Family History Depression Family History Hypertension M died with CVA ? lupus F well GF CVA  Social History: Reviewed history from 11/24/2009 and no changes required. Single Occupation: hairdresser Former Smoker Regular exercise-yes Drug use-no  Review of Systems  The patient denies anorexia, fever, weight loss, weight gain, vision loss, decreased hearing, hoarseness, chest pain, syncope, dyspnea on exertion, peripheral edema, prolonged cough, headaches, hemoptysis, abdominal pain, melena, hematochezia, severe indigestion/heartburn, hematuria, incontinence, genital sores, muscle weakness, suspicious skin lesions, transient blindness, difficulty walking, depression, unusual weight change, abnormal bleeding, enlarged lymph nodes, angioedema, and testicular masses.    Physical Exam  General:  alert, well-developed, well-nourished, well-hydrated, appropriate dress, normal appearance, healthy-appearing, and cooperative to examination.   Head:  normocephalic, atraumatic, no abnormalities observed, and no abnormalities palpated.   Eyes:  vision grossly intact, pupils equal, and pupils round.   Ears:  External ear exam shows no significant lesions or deformities.  Otoscopic examination reveals clear canals, tympanic membranes are intact bilaterally without bulging, retraction, inflammation or discharge. Hearing is grossly normal bilaterally. Nose:  External nasal examination shows no deformity or inflammation. Nasal mucosa are pink and moist without lesions  or exudates. Mouth:  he has 3 excoriations along the right upper vermilion border with mild eryhtema but no exudate, induration, streaking, or vesicles Neck:  supple and no masses.   Chest Wall:  No deformities, masses, tenderness or gynecomastia noted. Lungs:  normal  respiratory effort and normal breath sounds.   Heart:  normal rate and regular rhythm.   Abdomen:  Bowel sounds positive,abdomen soft and non-tender without masses, organomegaly. Small midline hernia noted. Genitalia:  Testes bilaterally descended without nodularity, tenderness or masses. No scrotal masses or lesions. No penis lesions or urethral discharge. Msk:  No deformity or scoliosis noted of thoracic or lumbar spine.   Pulses:  R and L carotid,radial,femoral,dorsalis pedis and posterior tibial pulses are full and equal bilaterally Extremities:  No clubbing, cyanosis, edema, or deformity noted with normal full range of motion of all joints.   Neurologic:  No cranial nerve deficits noted. Station and gait are normal. Plantar reflexes are down-going bilaterally. DTRs are symmetrical throughout. Sensory, motor and coordinative functions appear intact. Skin:  turgor normal, no rashes, no ecchymoses, no petechiae, no purpura, no ulcerations, and no edema.   Cervical Nodes:  No lymphadenopathy noted Inguinal Nodes:  No significant adenopathy Psych:  Oriented X3, memory intact for recent and remote, good eye contact, not agitated, not suicidal, not homicidal, dysphoric affect, tearful, and moderately anxious.     Impression & Recommendations:  Problem # 1:  WELL ADULT EXAM (ICD-V70.0) Assessment New Health and age related issues were discussed. Available screening tests and vaccinations were discussed as well. Healthy life style including good diet and execise was discussed.  The labs were reviewed with the patient.  EKG WNL Declined vaccines  Problem # 2:  HYPOGLYCEMIA (ICD-251.2) Assessment: Comment Only Resolved  Problem # 3:  DEPRESSIVE DISORDER (ICD-311) Assessment: Improved The stress is over! The following medications were removed from the medication list:    Wellbutrin Sr 100 Mg Xr12h-tab (Bupropion hcl) .Marland Kitchen... 1 by mouth bid His updated medication list for this problem  includes:    Alprazolam 0.25 Mg Tabs (Alprazolam) .Marland Kitchen... 1po  two times a day as needed  Complete Medication List: 1)  Nexium 40 Mg Cpdr (Esomeprazole magnesium) .... Take 1 tab each morning 2)  Restasis 0.05 % Emul (Cyclosporine) .... Two times a day in b eyes 3)  Loratadine 10 Mg Tabs (Loratadine) .... Once daily as needed allergies 4)  Proair Hfa 108 (90 Base) Mcg/act Aers (Albuterol sulfate) .... 2 inh q4h as needed shortness of breath 5)  Nasonex 50 Mcg/act Susp (Mometasone furoate) .Marland Kitchen.. 1-2 each nostril qd 6)  Meclizine Hcl 12.5 Mg Tabs (Meclizine hcl) .Marland Kitchen.. 1 - 2 by mouth q 6 hrs as needed 7)  Alprazolam 0.25 Mg Tabs (Alprazolam) .Marland Kitchen.. 1po  two times a day as needed 8)  Onetouch Ultra Test Strp (Glucose blood) .... Use as directed 9)  Nasonex 50 Mcg/act Susp (Mometasone furoate) .Marland Kitchen.. 1-2 spr each nostril qd  Other Orders: EKG w/ Interpretation (93000)  Patient Instructions: 1)  Please schedule a follow-up appointment in 6 months. 2)  BMP prior to visit, ICD-9: 3)  Hepatic Panel prior to visit, ICD-9: 4)  CBC w/ Diff prior to visit, ICD-9:995.20 Prescriptions: NASONEX 50 MCG/ACT SUSP (MOMETASONE FUROATE) 1-2 spr each nostril qd  #1 x 6   Entered and Authorized by:   Tresa Garter MD   Signed by:   Tresa Garter MD on 01/23/2010   Method used:   Print then Give to  Patient   RxID:   [id number]

## 2010-07-18 NOTE — Progress Notes (Signed)
Summary: Labs  Phone Note Call from Patient Call back at [facility] Phone [phone]   Summary of Call: Patient is requesting labs to check for diabetes. He has family history of diabetes.  Initial call taken by: Lamar Sprinkles, CMA,  November 25, 2009 8:16 AM  Follow-up for Phone Call        ok glucose 995.20 Follow-up by: Tresa Garter MD,  November 25, 2009 5:34 PM  Additional Follow-up for Phone Call Additional follow up Details #1::        Patient notified and order added to IDX.Marland KitchenMarland KitchenAlvy Beal Archie CMA  November 28, 2009 8:26 AM

## 2010-07-18 NOTE — Progress Notes (Signed)
Summary: REQ FOR LABS  Phone Note Call from Patient Call back at Home Phone [phone]   Summary of Call: Patient is requesting STD lab panel.  Initial call taken by: Lamar Sprinkles, CMA,  April 17, 2010 11:48 AM  Follow-up for Phone Call        There is blood tests (HIV, hepatitis, syphyllis) and urethral smear tests (GC, chlam). Is it blood tests? Follow-up by: Tresa Garter MD,  April 17, 2010 9:01 PM  Additional Follow-up for Phone Call Additional follow up Details #1::        left message for pt to callback office Additional Follow-up by: Brenton Grills CMA Duncan Dull),  April 18, 2010 10:27 AM    Additional Follow-up for Phone Call Additional follow up Details #2::    pt requesting blood test Follow-up by: Brenton Grills CMA Duncan Dull),  April 18, 2010 10:44 AM  Additional Follow-up for Phone Call Additional follow up Details #3:: Details for Additional Follow-up Action Taken: OK HIV, Hep A, B and C serology 070.30 Additional Follow-up by: Tresa Garter MD,  April 18, 2010 1:00 PM  Patient notified/la

## 2010-07-18 NOTE — Assessment & Plan Note (Signed)
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 He woke up today at 4 am to go to the bathroom and developed a myirad of symptoms-dizzy, diffuse weakness, shaky, couldn't take a deep breath, felt anxious, his eyes were twitching, his stomach was queazy, and he had pressure in his ears. He took 1/2 tab of meclizine and ate a cookie and felt some better, he did not take a xanax. 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  Also, he has cold sores on his upper lip for 5 days.  Preventive Screening-Counseling & Management  Alcohol-Tobacco     Alcohol drinks/day: <1     Alcohol type: wine     >5/day in last 3 mos: no     Alcohol Counseling: not indicated; use of alcohol is not excessive or problematic     Feels need to cut down: no     Feels annoyed by complaints: no     Feels guilty re: drinking: no     Needs 'eye opener' in am: no     Smoking Status: quit  Caffeine-Diet-Exercise     Does Patient Exercise: yes      Drug Use:  no.        Blood Transfusions:  no.    Medications Prior to Update: 1)  Nexium 40 Mg Cpdr (Esomeprazole Magnesium) .... Take 1 Tab Each Morning 2)  Restasis 0.05 % Emul (Cyclosporine) .... Two Times A Day in B Eyes 3)  Loratadine 10 Mg  Tabs (Loratadine) .... Once Daily As Needed Allergies 4)  Proair Hfa 108 (90 Base) Mcg/act  Aers (Albuterol Sulfate) .... 2 Inh Q4h As Needed Shortness  of Breath 5)  Nasonex 50 Mcg/act Susp (Mometasone Furoate) .Marland Kitchen.. 1-2 Each Nostril Qd 6)  Meclizine Hcl 12.5 Mg Tabs (Meclizine Hcl) .Marland Kitchen.. 1 - 2 By Mouth Q 6 Hrs As Needed 7)  Alprazolam 0.25 Mg Tabs (Alprazolam) .Marland Kitchen.. 1po  Two Times A Day As Needed  Current Medications (verified): 1)  Nexium 40 Mg Cpdr (Esomeprazole Magnesium) .... Take 1 Tab Each Morning 2)  Restasis 0.05 % Emul (Cyclosporine) .... Two Times A Day in B Eyes 3)  Loratadine 10 Mg  Tabs (Loratadine) .... Once Daily As Needed Allergies 4)  Proair Hfa 108 (90 Base) Mcg/act  Aers (Albuterol Sulfate) .... 2 Inh Q4h As Needed Shortness of Breath 5)  Nasonex 50 Mcg/act Susp (Mometasone Furoate) .Marland Kitchen.. 1-2 Each Nostril Qd 6)  Meclizine Hcl 12.5 Mg Tabs (Meclizine Hcl) .Marland Kitchen.. 1 - 2 By Mouth Q 6 Hrs As Needed 7)  Alprazolam 0.25 Mg Tabs (Alprazolam) .Marland Kitchen.. 1po  Two Times A Day As Needed 8)  Zovirax 800 Mg Tabs (Acyclovir) .... One  By Mouth Three Times A Day For 7 Days 9)  Sertraline Hcl 50 Mg Tabs (Sertraline Hcl) .... One By Mouth Once Daily  Allergies (verified): 1)  ! Codeine 2)  ! Penicillin  Past History:  Past Medical History: Last updated: 09/13/2009 Allergic rhinitis Hep B recovered 2008 Hypothyroidism 2010 Dr Raquel Sarna ? urethritis 2010 Dry eyes Anxiety Asthmatic bronchitis GERD  Past Surgical History: Last updated: 01/08/2008 Liposuction Face Lift Tear duct plugs 2009  Family History: Last updated: 01/08/2008 Family History Depression Family History Hypertension  Social History: Last updated: 11/24/2009 Single Occupation: hairdresser Former Smoker Regular exercise-yes Drug use-no  Risk Factors: Alcohol Use: <1 (11/24/2009) >5 drinks/d w/in last 3 months: no (11/24/2009) Exercise: yes (11/24/2009)  Risk Factors: Smoking Status: quit (11/24/2009)  Family History: Reviewed history from 01/08/2008 and no changes required. Family History Depression Family History Hypertension  Social  History: Reviewed history from 05/16/2009 and no changes required. Single Occupation: hairdresser Former Smoker Regular exercise-yes Drug use-no Blood Transfusions:  no Drug Use:  no  Review of Systems       The patient complains of depression.  The patient denies anorexia, fever, weight loss, chest pain, syncope, dyspnea on exertion, peripheral edema, prolonged cough, headaches, hemoptysis, abdominal pain, hematuria, unusual weight change, abnormal bleeding, and enlarged lymph nodes.   General:  Denies chills, fatigue, fever, loss of appetite, sleep disorder, sweats, and weight loss. Derm:  Complains of lesion(s) and rash; denies dryness, excessive perspiration, flushing, itching, and poor wound healing. Psych:  Complains of anxiety, depression, and panic attacks; denies alternate hallucination ( auditory/visual), easily angered, easily tearful, irritability, mental problems, sense of great danger, suicidal thoughts/plans, thoughts of violence, unusual visions or sounds, and thoughts /plans of harming others.  Physical Exam  General:  alert, well-developed, well-nourished, well-hydrated, appropriate dress, normal appearance, healthy-appearing, and cooperative to examination.   Head:  normocephalic, atraumatic, no abnormalities observed, and no abnormalities palpated.   Ears:  R ear normal and L ear normal.   Nose:  External nasal examination shows no deformity or inflammation. Nasal mucosa are pink and moist without lesions or exudates. Mouth:  he has 3 excoriations along the right upper vermilion border with mild eryhtema but no exudate, induration, streaking, or vesicles Neck:  supple and no masses.   Lungs:  normal respiratory effort and normal breath sounds.   Heart:  normal rate and regular rhythm.   Abdomen:  Bowel sounds positive,abdomen soft and non-tender without masses, organomegaly. Small midline hernia noted. Msk:  normal ROM, no joint tenderness, no joint swelling, no joint  warmth, no redness over joints, no joint deformities, no joint instability, and no crepitation.   Pulses:  R and L carotid,radial,femoral,dorsalis pedis and posterior tibial pulses are full and equal bilaterally Extremities:  No clubbing, cyanosis, edema, or deformity noted with normal full range of motion of all joints.   Neurologic:  No cranial nerve deficits noted. Station and gait are normal. Plantar reflexes are down-going bilaterally. DTRs are symmetrical throughout. Sensory, motor and coordinative functions appear intact. Skin:  turgor normal, no rashes, no ecchymoses, no petechiae, no purpura, no ulcerations, and no edema.   Cervical Nodes:  no anterior cervical adenopathy and no posterior cervical adenopathy.   Axillary Nodes:  no R axillary adenopathy and no L axillary adenopathy.   Inguinal Nodes:  no R inguinal adenopathy and no L inguinal adenopathy.   Psych:  Oriented X3, memory intact for recent and remote, good eye contact, not agitated, not suicidal, not  homicidal, dysphoric affect, tearful, and moderately anxious.     Impression & Recommendations:  Problem # 1:  DEPRESSIVE DISORDER (ICD-311) Assessment New  His updated medication list for this problem includes:    Alprazolam 0.25 Mg Tabs (Alprazolam) .Marland Kitchen... 1po  two times a day as needed    Sertraline Hcl 50 Mg Tabs (Sertraline hcl) ..... One by mouth once daily  Discussed treatment options, including trial of antidpressant medication. Will refer to behavioral health. Follow-up call in in 24-48 hours and recheck in 2 weeks, sooner as needed. Patient agrees to call if any worsening of symptoms or thoughts of doing harm arise. Verified that the patient has no suicidal ideation at this time.   Problem # 2:  PANIC DISORDER (ICD-300.01) Assessment: New  His updated medication list for this problem includes:    Alprazolam 0.25 Mg Tabs (Alprazolam) .Marland Kitchen... 1po  two times a day as needed    Sertraline Hcl 50 Mg Tabs (Sertraline hcl)  ..... One by mouth once daily  Discussed medication use and relaxation techniques.   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 Take 1 tab each morning 2)  Restasis 0.05 % Emul (Cyclosporine) .... Two times a day in b eyes 3)  Loratadine 10 Mg Tabs (Loratadine) .... Once daily as needed allergies 4)  Proair Hfa 108 (90 Base) Mcg/act Aers (Albuterol sulfate) .... 2 inh q4h as needed shortness of breath 5)  Nasonex 50 Mcg/act Susp (Mometasone furoate) .Marland Kitchen.. 1-2 each nostril qd 6)  Meclizine Hcl 12.5 Mg Tabs (Meclizine hcl) .Marland Kitchen.. 1 - 2 by mouth q 6 hrs as needed 7)  Alprazolam 0.25 Mg Tabs (Alprazolam) .Marland Kitchen.. 1po  two times a day as needed 8)  Zovirax 800 Mg Tabs (Acyclovir) .... One by mouth three times a day for 7 days 9)  Sertraline Hcl 50 Mg Tabs (Sertraline hcl) .... One by mouth once daily  Patient Instructions: 1)  Please schedule a follow-up appointment in 1 month. Prescriptions: SERTRALINE HCL 50 MG TABS (SERTRALINE HCL) One by mouth once daily  #30 x 11   Entered and Authorized by:   Etta Grandchild MD   Signed by:   Etta Grandchild MD on 11/24/2009   Method used:   Print then Give to Patient   RxID:   [id number] ZOVIRAX 800 MG TABS (ACYCLOVIR) One by mouth three times a day for 7 days  #21 x 11   Entered and Authorized by:   Etta Grandchild MD   Signed by:   Etta Grandchild MD on 11/24/2009   Method used:   Print then Give to Patient   RxID:   [id number]

## 2010-07-18 NOTE — Progress Notes (Signed)
Summary: PSA LABS  Phone Note Call from Patient Call back at Home Phone [phone]   Caller: Patient Summary of Call: Mr. Searls is coming in Aug for his CPX with labs prior.  He wants to add a PSA to the labs.  Is it ok to add? Initial call taken by: Hilarie Fredrickson,  November 16, 2009 4:59 PM  Follow-up for Phone Call        ok to add PSA Follow-up by: Tresa Garter MD,  November 16, 2009 6:07 PM  Additional Follow-up for Phone Call Additional follow up Details #1::        PSA HAS BEEN ADDED. Additional Follow-up by: Hilarie Fredrickson,  November 17, 2009 8:10 AM

## 2010-07-18 NOTE — Letter (Signed)
Summary: [facility]  [facility]   Imported By: Lennie Odor 05/23/2010 12:04:27  _____________________________________________________________________  External Attachment:    Type:   Image     Comment:   External Document

## 2010-07-18 NOTE — Progress Notes (Signed)
Summary: req call  Phone Note Call from Patient Call back at Home Phone [phone]   Caller: Patient Reason for Call: Talk to Doctor Summary of Call: Pt left msg on vm requesting to speak with Dr. Posey Rea only. Pls call his # [phone] Initial call taken by: Orlan Leavens RMA,  May 01, 2010 4:21 PM  Follow-up for Phone Call        Pt called  re: ? hemorrhoid - using Prep H Colonosc if not well in 2 wks ( had one Nl 8 y ago) Follow-up by: Tresa Garter MD,  May 02, 2010 1:04 PM

## 2010-07-20 NOTE — Assessment & Plan Note (Signed)
Summary: lower groin pain/#/cd   Vital Signs:  Patient profile:   45 year old male Height:      73 inches Weight:      190 pounds BMI:     25.16 Temp:     98.8 degrees F oral Pulse rate:   72 / minute Pulse rhythm:   regular Resp:     16 per minute BP sitting:   140 / 90  (left arm) Cuff size:   regular  Vitals Entered By: Lanier Prude, Beverly Gust) (June 13, 2010 3:36 PM) CC: lower abd "burning" sensation worse on Rt side Is Patient Diabetic? No Comments pt is not taking nexium, Loratadine or nasonex   Primary Care Provider:  Georgina Quint Plotnikov MD  CC:  lower abd "burning" sensation worse on Rt side.  History of Present Illness: C/o burning pain x 2 wks  in the pubic area 2/10 now; sitting, leaning makes it worse. The worst is 4/10. No injury. He did have a nl urol w/up. F/u anxiety  Current Medications (verified): 1)  Nexium 40 Mg Cpdr (Esomeprazole Magnesium) .... Take 1 Tab Each Morning 2)  Restasis 0.05 % Emul (Cyclosporine) .... Two Times A Day in B Eyes 3)  Loratadine 10 Mg  Tabs (Loratadine) .... Once Daily As Needed Allergies 4)  Proair Hfa 108 (90 Base) Mcg/act  Aers (Albuterol Sulfate) .... 2 Inh Q4h As Needed Shortness of Breath 5)  Nasonex 50 Mcg/act Susp (Mometasone Furoate) .Marland Kitchen.. 1-2 Each Nostril Qd 6)  Meclizine Hcl 12.5 Mg Tabs (Meclizine Hcl) .Marland Kitchen.. 1 - 2 By Mouth Q 6 Hrs As Needed 7)  Alprazolam 0.25 Mg Tabs (Alprazolam) .Marland Kitchen.. 1po  Two Times A Day As Needed 8)  Onetouch Ultra Test  Strp (Glucose Blood) .... Use As Directed 9)  Nasonex 50 Mcg/act Susp (Mometasone Furoate) .Marland Kitchen.. 1-2 Spr Each Nostril Qd 10)  Citalopram Hydrobromide 10 Mg Tabs (Citalopram Hydrobromide) .Marland Kitchen.. 1 By Mouth Once Daily For Depression  Allergies (verified): 1)  ! Codeine 2)  ! Penicillin  Past History:  Past Medical History: Last updated: 05/22/2010 Allergic rhinitis Hep B recovered 2008 Hypothyroidism 2010 Dr Raquel Sarna ? urethritis 2010 Dry eyes Anxiety Asthmatic  bronchitis GERD Internal hemorrhoids  Social History: Last updated: 11/24/2009 Single Occupation: hairdresser Former Smoker Regular exercise-yes Drug use-no  Review of Systems  The patient denies fever, abdominal pain, melena, hematochezia, and hematuria.    Physical Exam  General:  alert, well-developed, well-nourished, well-hydrated, appropriate dress, normal appearance, healthy-appearing, and cooperative to examination.   Abdomen:  Bowel sounds positive,abdomen soft and non-tender without masses, organomegaly or hernias noted. Msk:  Hips, LS w/full ROM and NT Deep palpation of R pubic ramus is sensitive Neurologic:  No cranial nerve deficits noted. Station and gait are normal. Plantar reflexes are down-going bilaterally. DTRs are symmetrical throughout. Sensory, motor and coordinative functions appear intact. Skin:  Intact without suspicious lesions or rashes Psych:  Oriented X3, memory intact for recent and remote, good eye contact, not agitated, not suicidal, not homicidal,    Impression & Recommendations:  Problem # 1:  ABDOMINAL PAIN, GENERALIZED (ICD-789.07)/suprapubic MSK Assessment New Stretch Advil as needed Call if you are not better in a reasonable amount of time or if worse. Orders: TLB-Udip w/ Micro (81001-URINE)  Problem # 2:  ANXIETY (ICD-300.00) Assessment: Improved  His updated medication list for this problem includes:    Alprazolam 0.25 Mg Tabs (Alprazolam) .Marland Kitchen... 1po  two times a day as needed    Citalopram  Hydrobromide 10 Mg Tabs (Citalopram hydrobromide) .Marland Kitchen... 1 by mouth once daily for depression  Complete Medication List: 1)  Nexium 40 Mg Cpdr (Esomeprazole magnesium) .... Take 1 tab each morning 2)  Restasis 0.05 % Emul (Cyclosporine) .... Two times a day in b eyes 3)  Loratadine 10 Mg Tabs (Loratadine) .... Once daily as needed allergies 4)  Proair Hfa 108 (90 Base) Mcg/act Aers (Albuterol sulfate) .... 2 inh q4h as needed shortness of  breath 5)  Nasonex 50 Mcg/act Susp (Mometasone furoate) .Marland Kitchen.. 1-2 each nostril qd 6)  Meclizine Hcl 12.5 Mg Tabs (Meclizine hcl) .Marland Kitchen.. 1 - 2 by mouth q 6 hrs as needed 7)  Alprazolam 0.25 Mg Tabs (Alprazolam) .Marland Kitchen.. 1po  two times a day as needed 8)  Onetouch Ultra Test Strp (Glucose blood) .... Use as directed 9)  Nasonex 50 Mcg/act Susp (Mometasone furoate) .Marland Kitchen.. 1-2 spr each nostril qd 10)  Citalopram Hydrobromide 10 Mg Tabs (Citalopram hydrobromide) .Marland Kitchen.. 1 by mouth once daily for depression  Patient Instructions: 1)  Call if you are not better in a reasonable amount of time or if worse.    Medication Administration  Injection # 1:    Medication: Kenalog 10 mg inj  Orders Added: 1)  TLB-Udip w/ Micro [81001-URINE] 2)  Est. Patient Level III [16109]

## 2010-07-20 NOTE — Progress Notes (Signed)
Summary: N&V, diarrhea  Phone Note Other Incoming Message from:  Patient on June 21, 2010 10:16 AM  Summary of Call: Pts partner called in and states pt has been having vomitting, diarrhea and body aches. What do you advise for this pt Initial call taken by: Ami Bullins CMA,  June 21, 2010 10:17 AM  Follow-up for Phone Call        per Dr. Posey Rea advise pt increase clear liquid intake. We will call in Promethazine 25 mg 1-2 by mouth qid as needed # 30 and Lomotil 1-2 by mouth qid as needed #30 and advise OV or ER is symptoms dont resolve.    left mess for pt to call back to inform of above and to see what pharm he uses. Follow-up by: Lanier Prude, [facility]),  June 21, 2010 10:35 AM  Additional Follow-up for Phone Call Additional follow up Details #1::        pt informed/ Rx's called in. Additional Follow-up by: Lanier Prude, [facility]),  June 21, 2010 11:58 AM    New/Updated Medications: LOMOTIL 2.5-0.025 MG TABS (DIPHENOXYLATE-ATROPINE) 1-2 by mouth four times daily  as needed PROMETHAZINE HCL 25 MG TABS (PROMETHAZINE HCL) 1-2 by mouth four times daily as needed for nausea Prescriptions: PROMETHAZINE HCL 25 MG TABS (PROMETHAZINE HCL) 1-2 by mouth four times daily as needed for nausea  #30 x 0   Entered by:   Lanier Prude, CMA(AAMA)   Authorized by:   Tresa Garter MD   Signed by:   Lanier Prude, CMA(AAMA) on 06/21/2010   Method used:   Telephoned to ...       [facility]  [phone]* (retail)       [address] [place], [place]  [address]       Ph: [phone] or [phone]       Fax: [phone]   RxID:   [id number] LOMOTIL 2.5-0.025 MG TABS (DIPHENOXYLATE-ATROPINE) 1-2 by mouth four times daily  as needed  #30 x 0   Entered by:   Lanier Prude, CMA(AAMA)   Authorized by:   Tresa Garter MD   Signed by:   Lanier Prude, CMA(AAMA) on 06/21/2010   Method used:   Telephoned to ...       [facility]  [phone]*  (retail)       [address] [place], [place]  [address]       Ph: [phone] or [phone]       Fax: [phone]   RxID:   [id number]

## 2010-07-20 NOTE — Progress Notes (Signed)
Summary: FAX RECORDS  Phone Note Call from Patient   Summary of Call: Wants records from last office visits regarding hemorrhoids faxed to dr Kinnie Scales. Done - need to call pt to inform Initial call taken by: Lamar Sprinkles, CMA,  July 04, 2010 2:48 PM  Follow-up for Phone Call        Faxed earlier today, Pt informed  Follow-up by: Lamar Sprinkles, CMA,  July 04, 2010 5:25 PM

## 2010-07-20 NOTE — Progress Notes (Signed)
Summary: ?  Phone Note Call from Patient Call back at [facility] Phone [phone]   Summary of Call: 1.Pt was started on celexa, Currently taking 1/2 tab, when does he take the full dose?  2. Seen for hemorrhoid issue - is it ok to start running this week?  Initial call taken by: Lamar Sprinkles, CMA,  May 29, 2010 9:36 AM  Follow-up for Phone Call        1. Start full tab Celexa tomorrow 2. OK to start running Follow-up by: Tresa Garter MD,  May 29, 2010 5:42 PM  Additional Follow-up for Phone Call Additional follow up Details #1::        Pt informed  Additional Follow-up by: Lamar Sprinkles, CMA,  May 29, 2010 5:45 PM

## 2010-07-24 ENCOUNTER — Encounter: Payer: Self-pay | Admitting: Internal Medicine

## 2010-08-09 NOTE — Letter (Signed)
Summary: Griffith Citron MD  Griffith Citron MD   Imported By: Lester [place] 08/04/2010 07:54:24  _____________________________________________________________________  External Attachment:    Type:   Image     Comment:   External Document

## 2010-09-11 ENCOUNTER — Encounter: Payer: Self-pay | Admitting: Internal Medicine

## 2010-10-13 ENCOUNTER — Telehealth: Payer: Self-pay | Admitting: *Deleted

## 2010-10-13 MED ORDER — PANCRELIPASE (LIP-PROT-AMYL) 24000-76000 UNITS PO CPEP: 1.0000 | ORAL_CAPSULE | Freq: Three times a day (TID) | ORAL | Status: DC

## 2010-10-13 NOTE — Telephone Encounter (Signed)
Patient informed. 

## 2010-10-13 NOTE — Telephone Encounter (Signed)
Ok Creon

## 2010-10-13 NOTE — Telephone Encounter (Signed)
Patient requesting rx from MD for digestive enzymes. He is c/o abd bloating and discomfort. He had colon with Dr Kinnie Scales who said all was ok but told him the digestive issues was with his enzymes. He has had rx in the past from Dr Macario Golds per pt.

## 2010-10-16 ENCOUNTER — Telehealth: Payer: Self-pay | Admitting: *Deleted

## 2010-10-16 NOTE — Telephone Encounter (Signed)
Pharm is req a call back to verify creon dosage.

## 2010-10-16 NOTE — Telephone Encounter (Signed)
Pharmacist at [facility] states this med is not available in 24,000units. The highest available is 20,000. Please advise if this is appropriate or if you want to change Rx.

## 2010-10-17 NOTE — Telephone Encounter (Signed)
OK to change. Thanks,

## 2010-10-18 NOTE — Telephone Encounter (Signed)
Pharmacy informed by vm

## 2010-10-31 NOTE — Assessment & Plan Note (Signed)
[facility]                           PRIMARY CARE OFFICE NOTE   NAME:Haver, Glen [name]                       MRN:          [id number]  DATE:12/09/2006                            DOB:          10/28/1965    The patient is a 45 year old male who presents for a wellness  examination.   PAST MEDICAL HISTORY, FAMILY HISTORY, SOCIAL HISTORY:  As per December 03, 2005 note.   Medicines reviewed.   ALLERGIES:  CODEINE and PENICILLIN.   REVIEW OF SYSTEMS:  No chest pain or shortness of breath.  The diarrhea  has stopped.  Energy level is good.  Exercising quite a bit. No  emotional problems.  The rest of the 18 point review of systems is  negative.   PHYSICAL:  Blood pressure 116/74.  Pulse 61, temperature 97.1, weight  175 pounds.  Looks well.  He is in no acute distress.  HEENT:  Moist mucosa.  NECK:  Supple.  No thyromegaly or bruit.  LUNGS:  Clear.  No wheeze or rales.  HEART:  S1, S2, no murmur, no gallop.  ABDOMEN:  Soft, nontender, no organomegaly, no mass palpable.  LOWER EXTREMITIES:  Without edema.  He is alert, oriented and cooperative.  He denies being depressed.  GENITALIA:  Exam was recently done by urologist.  RECTAL:  Examination declined.  SKIN:  Clear.   LABS:  On December 04, 2006:  CBC normal, BMET normal, LFTs normal.  Cholesterol 178, HDL 54, LDL 117.  TSH normal.  EKG normal.   ASSESSMENT AND PLAN:  1. Normal wellness physical examination.  Age/health-related issues      discussed.  Healthy lifestyle discussed.  He will continue to      exercise.  He will take vitamin D-3 in the wintertime 1000 units 2      to 3 times a week.  Repeat exam in 12 months.  2. History of hepatitis B, recovered.  Will check hepatitis B antibody      titer later to see if he needs any boost vaccination.  3. Recently diagnosed hydrocele evaluated by urologist.    Georgina Quint. Plotnikov, MD  Electronically Signed   AVP/MedQ  DD: 12/12/2006  DT:  12/12/2006  Job #: [id number]

## 2010-11-03 ENCOUNTER — Telehealth: Payer: Self-pay | Admitting: *Deleted

## 2010-11-03 DIAGNOSIS — Z86010 Personal history of colonic polyps: Secondary | ICD-10-CM | POA: Insufficient documentation

## 2010-11-03 MED ORDER — AZITHROMYCIN 250 MG PO TABS: ORAL_TABLET | ORAL | Status: AC

## 2010-11-03 NOTE — Telephone Encounter (Signed)
Patient requesting Zpak for 'sinus infection" - fever, sinus congestion/drainage & cough.

## 2010-11-03 NOTE — Telephone Encounter (Signed)
Joel informed.

## 2010-11-03 NOTE — Telephone Encounter (Signed)
Ok Zpac Thx 

## 2010-12-18 ENCOUNTER — Other Ambulatory Visit: Payer: Self-pay | Admitting: Internal Medicine

## 2010-12-25 ENCOUNTER — Other Ambulatory Visit: Payer: Self-pay | Admitting: Internal Medicine

## 2010-12-25 NOTE — Telephone Encounter (Signed)
Ok x 3  

## 2010-12-28 ENCOUNTER — Telehealth: Payer: Self-pay | Admitting: *Deleted

## 2010-12-28 MED ORDER — AZITHROMYCIN 250 MG PO TABS: ORAL_TABLET | ORAL | Status: DC

## 2010-12-28 NOTE — Telephone Encounter (Signed)
Caller c/o Sinus pain & mucus green in color for him & his partner Harmon Pier DOB:Jun 10, 1966] 1) Caller is requesting ZPack ABX sent to [facility] on [address] @ [phone]

## 2010-12-28 NOTE — Telephone Encounter (Signed)
OK to fill this prescription with additional refills x0 Thank you!  

## 2010-12-29 ENCOUNTER — Telehealth: Payer: Self-pay | Admitting: *Deleted

## 2010-12-29 MED ORDER — ALPRAZOLAM 0.25 MG PO TABS: 0.2500 mg | ORAL_TABLET | Freq: Two times a day (BID) | ORAL | Status: DC | PRN

## 2010-12-29 NOTE — Telephone Encounter (Signed)
Pt is requesting refill on Alprazolam 0.25mg  Please Advise refill. Thank you

## 2011-01-02 NOTE — Telephone Encounter (Signed)
Rx signed and faxed to pharmacy on 01-01-11.

## 2011-01-29 ENCOUNTER — Telehealth: Payer: Self-pay | Admitting: *Deleted

## 2011-01-29 DIAGNOSIS — Z0000 Encounter for general adult medical examination without abnormal findings: Secondary | ICD-10-CM

## 2011-01-29 DIAGNOSIS — Z125 Encounter for screening for malignant neoplasm of prostate: Secondary | ICD-10-CM

## 2011-01-29 NOTE — Telephone Encounter (Signed)
Labs entered///Pateint notified

## 2011-01-29 NOTE — Telephone Encounter (Signed)
Ok Thx 

## 2011-01-29 NOTE — Telephone Encounter (Signed)
Pt is req CPX labs including PSA prior to September OV. OK?

## 2011-02-24 ENCOUNTER — Other Ambulatory Visit: Payer: Self-pay | Admitting: Internal Medicine

## 2011-02-24 DIAGNOSIS — Z0000 Encounter for general adult medical examination without abnormal findings: Secondary | ICD-10-CM

## 2011-02-26 ENCOUNTER — Other Ambulatory Visit (INDEPENDENT_AMBULATORY_CARE_PROVIDER_SITE_OTHER): Payer: Self-pay

## 2011-02-26 DIAGNOSIS — Z0000 Encounter for general adult medical examination without abnormal findings: Secondary | ICD-10-CM

## 2011-02-26 DIAGNOSIS — Z125 Encounter for screening for malignant neoplasm of prostate: Secondary | ICD-10-CM

## 2011-02-26 LAB — CBC WITH DIFFERENTIAL/PLATELET
Basophils Relative: 0.3 % (ref 0.0–3.0)
Eosinophils Relative: 4.5 % (ref 0.0–5.0)
Lymphocytes Relative: 36.8 % (ref 12.0–46.0)
MCV: 93.9 fl (ref 78.0–100.0)
Monocytes Relative: 7.5 % (ref 3.0–12.0)
Neutrophils Relative %: 50.9 % (ref 43.0–77.0)
Platelets: 207 10*3/uL (ref 150.0–400.0)
RBC: 4.72 Mil/uL (ref 4.22–5.81)
WBC: 4.6 10*3/uL (ref 4.5–10.5)

## 2011-02-26 LAB — COMPREHENSIVE METABOLIC PANEL
Albumin: 4.1 g/dL (ref 3.5–5.2)
Alkaline Phosphatase: 46 U/L (ref 39–117)
BUN: 12 mg/dL (ref 6–23)
CO2: 29 mEq/L (ref 19–32)
GFR: 96.55 mL/min (ref >60.00)
Glucose, Bld: 88 mg/dL (ref 70–99)
Potassium: 4.3 mEq/L (ref 3.5–5.1)
Total Protein: 7 g/dL (ref 6.0–8.3)

## 2011-02-26 LAB — LIPID PANEL: Triglycerides: 34 mg/dL (ref 0.0–149.0)

## 2011-02-26 LAB — URINALYSIS, ROUTINE W REFLEX MICROSCOPIC
Hgb urine dipstick: NEGATIVE
Ketones, ur: NEGATIVE
Leukocytes, UA: NEGATIVE
Specific Gravity, Urine: >=1.03 (ref 1.000–1.030)
Urobilinogen, UA: 0.2 (ref 0.0–1.0)

## 2011-02-26 LAB — PSA: PSA: 0.48 ng/mL (ref 0.10–4.00)

## 2011-03-05 ENCOUNTER — Ambulatory Visit (INDEPENDENT_AMBULATORY_CARE_PROVIDER_SITE_OTHER): Payer: BC Managed Care – PPO | Admitting: Internal Medicine

## 2011-03-05 ENCOUNTER — Encounter: Payer: Self-pay | Admitting: Internal Medicine

## 2011-03-05 VITALS — BP 130/80 | HR 76 | Temp 97.8°F | Resp 16 | Ht 73.0 in | Wt 192.0 lb

## 2011-03-05 DIAGNOSIS — Z23 Encounter for immunization: Secondary | ICD-10-CM

## 2011-03-05 DIAGNOSIS — Z0000 Encounter for general adult medical examination without abnormal findings: Secondary | ICD-10-CM | POA: Insufficient documentation

## 2011-03-05 NOTE — Progress Notes (Signed)
  Subjective:    Patient ID: Glen [name], male    DOB: 02/25/1966, 45 y.o.   MRN: [id number]  HPI  The patient is here for a wellness exam. The patient has been doing well overall without major physical or psychological issues going on lately. Review of Systems  Constitutional: Negative for appetite change, fatigue and unexpected weight change.  HENT: Negative for nosebleeds, congestion, sore throat, sneezing, trouble swallowing and neck pain.   Eyes: Negative for itching and visual disturbance.  Respiratory: Negative for cough.   Cardiovascular: Negative for chest pain, palpitations and leg swelling.  Gastrointestinal: Negative for nausea, diarrhea, blood in stool and abdominal distention.  Genitourinary: Negative for frequency and hematuria.  Musculoskeletal: Negative for back pain, joint swelling and gait problem.  Skin: Negative for rash.  Neurological: Negative for dizziness, tremors, speech difficulty and weakness.  Psychiatric/Behavioral: Negative for sleep disturbance, dysphoric mood and agitation. The patient is not nervous/anxious.        Objective:   Physical Exam  Constitutional: He is oriented to person, place, and time. He appears well-developed.  HENT:  Mouth/Throat: Oropharynx is clear and moist.  Eyes: Conjunctivae are normal. Pupils are equal, round, and reactive to light.  Neck: Normal range of motion. No JVD present. No thyromegaly present.  Cardiovascular: Normal rate, regular rhythm, normal heart sounds and intact distal pulses.  Exam reveals no gallop and no friction rub.   No murmur heard. Pulmonary/Chest: Effort normal and breath sounds normal. No respiratory distress. He has no wheezes. He has no rales. He exhibits no tenderness.  Abdominal: Soft. Bowel sounds are normal. He exhibits no distension and no mass. There is no tenderness. There is no rebound and no guarding.  Musculoskeletal: Normal range of motion. He exhibits no edema and no tenderness.    Lymphadenopathy:    He has no cervical adenopathy.  Neurological: He is alert and oriented to person, place, and time. He has normal reflexes. No cranial nerve deficit. He exhibits normal muscle tone. Coordination normal.  Skin: Skin is warm and dry. No rash noted.  Psychiatric: He has a normal mood and affect. His behavior is normal. Judgment and thought content normal.   Wt Readings from Last 3 Encounters:  03/05/11 192 lb (87.091 kg)  06/13/10 190 lb (86.183 kg)  05/22/10 189 lb (85.73 kg)   Lab Results  Component Value Date   WBC 4.6 02/26/2011   HGB 15.2 02/26/2011   HCT 44.4 02/26/2011   PLT 207.0 02/26/2011   CHOL 182 02/26/2011   TRIG 34.0 02/26/2011   HDL 63.10 02/26/2011   LDLDIRECT 119.8 01/16/2010   ALT 21 02/26/2011   AST 23 02/26/2011   NA 142 02/26/2011   K 4.3 02/26/2011   CL 106 02/26/2011   CREATININE 0.9 02/26/2011   BUN 12 02/26/2011   CO2 29 02/26/2011   TSH 1.23 02/26/2011   PSA 0.48 02/26/2011   HGBA1C 5.3 12/02/2009          Assessment & Plan:

## 2011-03-05 NOTE — Assessment & Plan Note (Signed)
We discussed age appropriate health related issues, including available/recomended screening tests and vaccinations. We discussed a need for adhering to healthy diet and exercise. Labs/EKG were reviewed/ordered. All questions were answered.   

## 2011-03-15 ENCOUNTER — Other Ambulatory Visit: Payer: Self-pay | Admitting: Internal Medicine

## 2011-04-25 ENCOUNTER — Telehealth: Payer: Self-pay | Admitting: *Deleted

## 2011-04-25 NOTE — Telephone Encounter (Signed)
Please advise of below. I do not see where varicose veins were discussed at last OV.

## 2011-04-25 NOTE — Telephone Encounter (Signed)
Misty Stanley, please, write a letter. Thx

## 2011-04-25 NOTE — Telephone Encounter (Signed)
Patient requesting letter stating that he discussed Varicose Veins at last OV w/Dr Plotnikov-especially Right leg; needs to state that this is a medical condition that was discussed. Pt is going to have this matter taken care of at [facility] and needs this for Insurance help.

## 2011-04-26 NOTE — Telephone Encounter (Signed)
Letter printed/pending MD sig. 

## 2011-04-27 ENCOUNTER — Encounter: Payer: Self-pay | Admitting: *Deleted

## 2011-04-27 NOTE — Telephone Encounter (Signed)
Letter re-typed/signed/faxed to number provided by pt per Silas Flood., CMA.

## 2011-07-31 ENCOUNTER — Telehealth: Payer: Self-pay | Admitting: Internal Medicine

## 2011-07-31 NOTE — Telephone Encounter (Signed)
Received copies from Dr. Laurena Spies at [facility],on 07/31/11. Forwarded 2pages to Dr. Martha Clan review.

## 2011-08-14 ENCOUNTER — Other Ambulatory Visit: Payer: Self-pay | Admitting: Internal Medicine

## 2011-09-10 ENCOUNTER — Other Ambulatory Visit: Payer: Self-pay | Admitting: Internal Medicine

## 2011-09-11 ENCOUNTER — Telehealth: Payer: Self-pay | Admitting: *Deleted

## 2011-09-11 NOTE — Telephone Encounter (Signed)
Requested Medications     ibuprofen (ADVIL,MOTRIN) 600 MG tablet [Pharmacy Med Name: IBUPROFEN 600MG  TAB]   TAKE ONE TABLET BY MOUTH TWICE DAILY AS NEEDED FOR PAIN   Disp: 60 each R: 5 Start: 09/10/2011  Class: Normal   Requested on: 01/17/2009   Last refill: 12/12/2009

## 2011-09-11 NOTE — Telephone Encounter (Signed)
OK to fill this prescription with additional refills x3 Thank you!  

## 2011-09-12 MED ORDER — IBUPROFEN 600 MG PO TABS: 600.0000 mg | ORAL_TABLET | Freq: Two times a day (BID) | ORAL | Status: AC | PRN

## 2011-09-12 NOTE — Telephone Encounter (Signed)
Done

## 2011-11-07 ENCOUNTER — Ambulatory Visit (INDEPENDENT_AMBULATORY_CARE_PROVIDER_SITE_OTHER): Payer: BC Managed Care – PPO | Admitting: Sports Medicine

## 2011-11-07 ENCOUNTER — Encounter: Payer: Self-pay | Admitting: Sports Medicine

## 2011-11-07 VITALS — BP 137/77 | HR 57 | Ht 73.0 in | Wt 193.0 lb

## 2011-11-07 DIAGNOSIS — M7650 Patellar tendinitis, unspecified knee: Secondary | ICD-10-CM

## 2011-11-07 DIAGNOSIS — M7651 Patellar tendinitis, right knee: Secondary | ICD-10-CM

## 2011-11-07 MED ORDER — MELOXICAM 15 MG PO TABS: 15.0000 mg | ORAL_TABLET | Freq: Every day | ORAL | Status: AC

## 2011-11-07 NOTE — Assessment & Plan Note (Signed)
After starting to run about 3 months ago.  Use patellar straps bilaterally, exercises for patellar tendinitis, heel lifts added to shoes, continue custom orthotics which look fine, meloxicam prn, ice following exercise.

## 2011-11-07 NOTE — Progress Notes (Signed)
  Subjective:    Patient ID: Glen [name], male    DOB: [birth year], 46 y.o.   MRN: [id number]  HPI 46 year old male presenting with right knee pain and bilateral heel part after starting to run about 3 months ago.  He had taken a break from running about 1 year prior but then started running 15-20 miles a week about 3 months ago.  He has started experiencing right knee pain during running and bilateral heel pain after running.   He is not taking any medication or using any other intervention.   Review of Systems Denies tingling or numbness  Past Medical History, Family History, and Social History reviewed.     Objective:   Physical Exam Gen: NAD, well-nourished and well-appearing Psych: engaged, appropriate to questions, alert and oriented Pulm: NI WOB  RIGHT KNEE:  Inspection: no bruising or swelling Palpation: tenderness along inferior patella/patellar tendon   Medial and lateral joint lines palpable and non-tender Sensation: intact ROM: intact flexion, extension Negative Lachman's, anterior/posterior drawer, McMurray's  FOOT: Inspection: no bruising or swelling Palpation: minimal tenderness along heel   No tenderness along Achilles tendon or plantar fascia Sensation: intact ROM: full    Assessment & Plan:

## 2011-11-07 NOTE — Patient Instructions (Signed)
Heel lifts to help with heel pain.   For knee pain: -Try exercises -Meloxicam daily as needed for pain (anti-inflammatory) -Wear knee straps during activity -Ice after activity

## 2011-12-03 ENCOUNTER — Ambulatory Visit: Payer: BC Managed Care – PPO | Admitting: Sports Medicine

## 2012-01-21 ENCOUNTER — Telehealth: Payer: Self-pay | Admitting: Internal Medicine

## 2012-01-21 MED ORDER — VALACYCLOVIR HCL 500 MG PO TABS: 500.0000 mg | ORAL_TABLET | Freq: Two times a day (BID) | ORAL | Status: DC

## 2012-01-21 NOTE — Telephone Encounter (Signed)
Noted Ok valtrex prn - done Thx

## 2012-01-21 NOTE — Telephone Encounter (Signed)
Caller: Glen [name]/Patient; PCP: Plotnikov, Alex; CB#: [phone]; ; ; Call regarding Cough/Congestion;   Patient states he developed sore throat, chest chest congestion. Onset 01/19/12. States sore throat has resolved but chest congestion remains.  Patient denies cough. States he has had intermittent body aches since 01/19/12. States fever, per tactile, on 01/19/12 and 01/20/12. Patient taking fluids well. States expectorating small amount of yellow tinged sputum at intervals. Patient states he experiences facial pressure when leaning forward. Triage per Upper Respiratory Infection Protocol. No emergent symptoms identified. Care advice given per guidelines. Patient advised increased fluids, warm fluids with honey, asline nasal washes/netty pot, inhaled steam, humidifier. Patient advised warm compresses to face. Patient advised over the counter Mucinex, per package instructions. Patient advised Ibuprofen 600mg . q 6 hours as needed for pain, advised to take with food. Call back parameters reviewed. Patient verbalizes understanding. Patient advised appointment for 01/21/12 due to positive triage assessment for Productive cough with colored sputum. PATIENT DECLINES OFFERED APPOINTMENT. PATIENT STATES HE IS ALSO LEAV[place] FOR [place] AND IS REQUESTING A PRESCRIPTION FOR FEVER BLISTERS. STATES HE OFTEN GETS FEVER BLISTERS WHEN [place] THE [place]ACH. PATIENT DOES NOT CURRENTLY HAVE ANY LIP LESIONS. PATIENT USES [facility] ON [address] @ [phone]. PATIENT CAN BE REACHED @ [phone]. Patient also advised to use sunscreen on lips while at the beach. Message sent to triage pool via Epic Electronic Health Record.

## 2012-01-22 NOTE — Telephone Encounter (Signed)
Pt advised of Rx/pharmacy per VM

## 2012-02-15 ENCOUNTER — Other Ambulatory Visit: Payer: Self-pay | Admitting: Internal Medicine

## 2012-02-15 NOTE — Telephone Encounter (Signed)
Needs OV.  

## 2012-03-10 ENCOUNTER — Telehealth: Payer: Self-pay | Admitting: Internal Medicine

## 2012-03-10 NOTE — Telephone Encounter (Signed)
OK Zpac OV if worse Thx

## 2012-03-10 NOTE — Telephone Encounter (Signed)
Caller: [name]/Patient; Patient Name: Glen [name]; PCP: Plotnikov, Alex (Adults only); [facility] Phone Number: [phone]. Caller reports after cold symptoms for about a week, he now has a sinus infection. Caller reports pain in his teeth and gums and pain and pressure in his face. Caller asking that PCP call in something to [facility]. Caller advised they do not usually call in medications and he will need to be seen. Appointment offered. Caller snaps, "I don't want to come in for that, and yes he does call in medications for me!" Caller is afebrile. Caller does not wish to be triaged and can be reached at above number.

## 2012-03-11 ENCOUNTER — Telehealth: Payer: Self-pay | Admitting: Internal Medicine

## 2012-03-11 MED ORDER — AZITHROMYCIN 250 MG PO TABS: ORAL_TABLET | ORAL | Status: AC

## 2012-03-11 NOTE — Telephone Encounter (Signed)
Pt advised via personal VM of Rx/pharmacy 

## 2012-03-11 NOTE — Telephone Encounter (Signed)
Caller: [name]/Patient; Patient Name: Glen [name]; PCP: Plotnikov, Alex (Adults only); [facility] Phone Number: [phone]. Caller reports he called yesterday, Monday 9/23 about getting some antibiotics for a sinus infection. This RN took call and sent EPIC note as requested. Caller reports he got a phone message this am advising MD was going to have a Zpack called to [facility] as requested. Caller asking if this drug will resolve his current symptoms. Caller advised a Zpack is the drug normally used for Sinusitis. He voices understanding and has no further questions.

## 2012-04-07 ENCOUNTER — Ambulatory Visit (INDEPENDENT_AMBULATORY_CARE_PROVIDER_SITE_OTHER): Payer: BC Managed Care – PPO | Admitting: Internal Medicine

## 2012-04-07 ENCOUNTER — Encounter: Payer: Self-pay | Admitting: Internal Medicine

## 2012-04-07 VITALS — BP 128/80 | HR 76 | Temp 98.0°F | Resp 16 | Wt 184.0 lb

## 2012-04-07 DIAGNOSIS — R002 Palpitations: Secondary | ICD-10-CM

## 2012-04-07 DIAGNOSIS — Z0000 Encounter for general adult medical examination without abnormal findings: Secondary | ICD-10-CM

## 2012-04-07 DIAGNOSIS — F411 Generalized anxiety disorder: Secondary | ICD-10-CM

## 2012-04-07 DIAGNOSIS — J309 Allergic rhinitis, unspecified: Secondary | ICD-10-CM

## 2012-04-07 MED ORDER — ACYCLOVIR 400 MG PO TABS: 400.0000 mg | ORAL_TABLET | Freq: Three times a day (TID) | ORAL | Status: DC

## 2012-04-07 MED ORDER — PROPRANOLOL HCL 20 MG PO TABS: 20.0000 mg | ORAL_TABLET | Freq: Two times a day (BID) | ORAL | Status: DC | PRN

## 2012-04-07 NOTE — Progress Notes (Signed)
   Subjective:    Patient ID: Glen [name], male    DOB: 09/25/1965, 46 y.o.   MRN: [id number]  HPI  C/o several episodes of irreg heart beat episodes x6 over past year (3 h long) - there was no CP or lightheadedness...  BP Readings from Last 3 Encounters:  04/07/12 128/80  11/07/11 137/77  03/05/11 130/80     Review of Systems  Constitutional: Negative for appetite change, fatigue and unexpected weight change.  HENT: Negative for nosebleeds, congestion, sore throat, sneezing, trouble swallowing and neck pain.   Eyes: Negative for itching and visual disturbance.  Respiratory: Negative for cough.   Cardiovascular: Negative for chest pain, palpitations and leg swelling.  Gastrointestinal: Negative for nausea, diarrhea, blood in stool and abdominal distention.  Genitourinary: Negative for frequency and hematuria.  Musculoskeletal: Negative for back pain, joint swelling and gait problem.  Skin: Negative for rash.  Neurological: Negative for dizziness, tremors, speech difficulty and weakness.  Psychiatric/Behavioral: Negative for disturbed wake/sleep cycle, dysphoric mood and agitation. The patient is not nervous/anxious.        Objective:   Physical Exam  Constitutional: He is oriented to person, place, and time. He appears well-developed.  HENT:  Mouth/Throat: Oropharynx is clear and moist.  Eyes: Conjunctivae normal are normal. Pupils are equal, round, and reactive to light.  Neck: Normal range of motion. No JVD present. No thyromegaly present.  Cardiovascular: Normal rate, regular rhythm, normal heart sounds and intact distal pulses.  Exam reveals no gallop and no friction rub.   No murmur heard. Pulmonary/Chest: Effort normal and breath sounds normal. No respiratory distress. He has no wheezes. He has no rales. He exhibits no tenderness.  Abdominal: Soft. Bowel sounds are normal. He exhibits no distension and no mass. There is no tenderness. There is no rebound and no  guarding.  Musculoskeletal: Normal range of motion. He exhibits no edema and no tenderness.  Lymphadenopathy:    He has no cervical adenopathy.  Neurological: He is alert and oriented to person, place, and time. He has normal reflexes. No cranial nerve deficit. He exhibits normal muscle tone. Coordination normal.  Skin: Skin is warm and dry. No rash noted.  Psychiatric: He has a normal mood and affect. His behavior is normal. Judgment and thought content normal.   Wt Readings from Last 3 Encounters:  04/07/12 184 lb (83.462 kg)  11/07/11 193 lb (87.544 kg)  03/05/11 192 lb (87.091 kg)   Lab Results  Component Value Date   WBC 4.6 02/26/2011   HGB 15.2 02/26/2011   HCT 44.4 02/26/2011   PLT 207.0 02/26/2011   CHOL 182 02/26/2011   TRIG 34.0 02/26/2011   HDL 63.10 02/26/2011   LDLDIRECT 119.8 01/16/2010   ALT 21 02/26/2011   AST 23 02/26/2011   NA 142 02/26/2011   K 4.3 02/26/2011   CL 106 02/26/2011   CREATININE 0.9 02/26/2011   BUN 12 02/26/2011   CO2 29 02/26/2011   TSH 1.23 02/26/2011   PSA 0.48 02/26/2011   HGBA1C 5.3 12/02/2009          Assessment & Plan:

## 2012-04-07 NOTE — Assessment & Plan Note (Signed)
Continue with current prescription therapy as reflected on the Med list.  

## 2012-04-07 NOTE — Assessment & Plan Note (Signed)
Continue with prn Xanax.

## 2012-04-07 NOTE — Assessment & Plan Note (Signed)
10/13 - recurrent Probable PAC or SVT ECHO He declined card consult/monitor. Labs.   Propranolol prn

## 2012-04-16 ENCOUNTER — Other Ambulatory Visit (HOSPITAL_COMMUNITY): Payer: BC Managed Care – PPO

## 2012-04-23 ENCOUNTER — Encounter: Payer: Self-pay | Admitting: Sports Medicine

## 2012-04-23 ENCOUNTER — Ambulatory Visit (INDEPENDENT_AMBULATORY_CARE_PROVIDER_SITE_OTHER): Payer: BC Managed Care – PPO | Admitting: Sports Medicine

## 2012-04-23 VITALS — Ht 73.0 in | Wt 184.0 lb

## 2012-04-23 DIAGNOSIS — M25569 Pain in unspecified knee: Secondary | ICD-10-CM

## 2012-04-23 DIAGNOSIS — M2140 Flat foot [pes planus] (acquired), unspecified foot: Secondary | ICD-10-CM

## 2012-04-23 NOTE — Addendum Note (Signed)
Addended by: Reino Bellis R on: 04/23/2012 03:24 PM   Modules accepted: Level of Service

## 2012-04-23 NOTE — Progress Notes (Signed)
Patient ID: Glen [name], male   DOB: [birth year], 46 y.o.   MRN: [id number] Please note that the previous notes on this patient was dictated in error.  Patient presents today for new custom orthotics. His current pair of orthotics are about 47 years old. He would like to have a pair constructed for his dress shoes. He has a history of pes planus and patellar tendinitis. Evaluation of his old orthotics shows him to be in fairly good shape but there is some wear near the metatarsal area. Extra cushioning was provided by padding this area with some blue foam. A separate pair of dress orthotics were also constructed. Patient found these to be very comfortable prior to leaving.  Followup when necessary  Patient was fitted for a : pinstripe dress orthotic The orthotic was heated and afterward the patient stood on the orthotic blank positioned on the orthotic stand. The patient was positioned in subtalar neutral position and 10 degrees of ankle dorsiflexion in a weight bearing stance. After completion of molding, a stable base was applied to the orthotic blank. The blank was ground to a stable position for weight bearing. Size: 13 Base: Blue EVA Posting and Padding: none

## 2012-04-23 NOTE — Progress Notes (Addendum)
  Subjective:   Note created under the wrong patient.  HPI  Pleas Review of Systems     Objective:   Physical Exam  Assessment & Plan:

## 2012-04-28 ENCOUNTER — Ambulatory Visit (HOSPITAL_COMMUNITY): Payer: BC Managed Care – PPO | Attending: Cardiology | Admitting: Radiology

## 2012-04-28 DIAGNOSIS — I379 Nonrheumatic pulmonary valve disorder, unspecified: Secondary | ICD-10-CM | POA: Insufficient documentation

## 2012-04-28 DIAGNOSIS — I080 Rheumatic disorders of both mitral and aortic valves: Secondary | ICD-10-CM | POA: Insufficient documentation

## 2012-04-28 DIAGNOSIS — I079 Rheumatic tricuspid valve disease, unspecified: Secondary | ICD-10-CM | POA: Insufficient documentation

## 2012-04-28 DIAGNOSIS — R002 Palpitations: Secondary | ICD-10-CM | POA: Insufficient documentation

## 2012-04-28 NOTE — Progress Notes (Signed)
Echocardiogram performed.  

## 2012-04-30 ENCOUNTER — Telehealth: Payer: Self-pay | Admitting: Internal Medicine

## 2012-04-30 NOTE — Telephone Encounter (Signed)
Misty Stanley, please, inform patient that his card ECHO was overall ok Keep ROV in Jan; check BP and HR 2/wk and bring me the record pls Thx

## 2012-05-01 NOTE — Telephone Encounter (Signed)
Pt informed

## 2012-05-16 ENCOUNTER — Other Ambulatory Visit: Payer: Self-pay | Admitting: Internal Medicine

## 2012-06-24 ENCOUNTER — Other Ambulatory Visit: Payer: Self-pay | Admitting: *Deleted

## 2012-06-24 DIAGNOSIS — Z0000 Encounter for general adult medical examination without abnormal findings: Secondary | ICD-10-CM

## 2012-06-30 ENCOUNTER — Other Ambulatory Visit (INDEPENDENT_AMBULATORY_CARE_PROVIDER_SITE_OTHER): Payer: BC Managed Care – PPO

## 2012-06-30 DIAGNOSIS — Z0000 Encounter for general adult medical examination without abnormal findings: Secondary | ICD-10-CM

## 2012-06-30 LAB — CBC WITH DIFFERENTIAL/PLATELET
Basophils Absolute: 0 10*3/uL (ref 0.0–0.1)
Basophils Relative: 0.5 % (ref 0.0–3.0)
Eosinophils Absolute: 0.2 10*3/uL (ref 0.0–0.7)
MCHC: 34.1 g/dL (ref 30.0–36.0)
MCV: 93.2 fl (ref 78.0–100.0)
Monocytes Absolute: 0.4 10*3/uL (ref 0.1–1.0)
Neutrophils Relative %: 48.5 % (ref 43.0–77.0)
Platelets: 222 10*3/uL (ref 150.0–400.0)
RDW: 12.2 % (ref 11.5–14.6)

## 2012-06-30 LAB — BASIC METABOLIC PANEL
CO2: 30 mEq/L (ref 19–32)
Calcium: 9.6 mg/dL (ref 8.4–10.5)
Glucose, Bld: 87 mg/dL (ref 70–99)
Sodium: 141 mEq/L (ref 135–145)

## 2012-06-30 LAB — URINALYSIS, ROUTINE W REFLEX MICROSCOPIC
Bilirubin Urine: NEGATIVE
Ketones, ur: NEGATIVE
Specific Gravity, Urine: 1.025 (ref 1.000–1.030)
Urine Glucose: NEGATIVE
pH: 6 (ref 5.0–8.0)

## 2012-06-30 LAB — LIPID PANEL
Cholesterol: 202 mg/dL — ABNORMAL HIGH (ref 0–200)
Triglycerides: 28 mg/dL (ref 0.0–149.0)

## 2012-06-30 LAB — HEPATIC FUNCTION PANEL
AST: 29 U/L (ref 0–37)
Alkaline Phosphatase: 37 U/L — ABNORMAL LOW (ref 39–117)
Bilirubin, Direct: 0.1 mg/dL (ref 0.0–0.3)
Total Protein: 6.6 g/dL (ref 6.0–8.3)

## 2012-06-30 LAB — PSA: PSA: 0.49 ng/mL (ref 0.10–4.00)

## 2012-06-30 LAB — LDL CHOLESTEROL, DIRECT: Direct LDL: 105.9 mg/dL

## 2012-07-07 ENCOUNTER — Ambulatory Visit (INDEPENDENT_AMBULATORY_CARE_PROVIDER_SITE_OTHER): Payer: BC Managed Care – PPO | Admitting: Internal Medicine

## 2012-07-07 ENCOUNTER — Encounter: Payer: Self-pay | Admitting: Internal Medicine

## 2012-07-07 VITALS — BP 122/90 | HR 80 | Temp 98.6°F | Resp 16 | Ht 73.0 in | Wt 189.0 lb

## 2012-07-07 DIAGNOSIS — Z0000 Encounter for general adult medical examination without abnormal findings: Secondary | ICD-10-CM

## 2012-07-07 DIAGNOSIS — F329 Major depressive disorder, single episode, unspecified: Secondary | ICD-10-CM

## 2012-07-07 DIAGNOSIS — R030 Elevated blood-pressure reading, without diagnosis of hypertension: Secondary | ICD-10-CM | POA: Insufficient documentation

## 2012-07-07 DIAGNOSIS — R002 Palpitations: Secondary | ICD-10-CM

## 2012-07-07 DIAGNOSIS — E291 Testicular hypofunction: Secondary | ICD-10-CM

## 2012-07-07 MED ORDER — VALACYCLOVIR HCL 500 MG PO TABS: 500.0000 mg | ORAL_TABLET | Freq: Two times a day (BID) | ORAL | Status: DC

## 2012-07-07 MED ORDER — VITAMIN D 1000 UNITS PO TABS: 1000.0000 [IU] | ORAL_TABLET | Freq: Every day | ORAL | Status: AC

## 2012-07-07 NOTE — Progress Notes (Signed)
   Subjective:    HPI The patient is here for a wellness exam. The patient has been doing well overall without major physical or psychological issues going on lately. He had some " low blood sugar episodes".  His brother was killed in the home invasion.  C/o L thumb OA -- Dr Merlyn Lot  F/u several episodes of irreg heart beat episodes x6 over past year (3 h long) - there was no CP or lightheadedness...  BP Readings from Last 3 Encounters:  07/07/12 122/90  04/07/12 128/80  11/07/11 137/77   Wt Readings from Last 3 Encounters:  07/07/12 189 lb (85.73 kg)  04/23/12 184 lb (83.462 kg)  04/07/12 184 lb (83.462 kg)     Review of Systems  Constitutional: Negative for appetite change, fatigue and unexpected weight change.  HENT: Negative for nosebleeds, congestion, sore throat, sneezing, trouble swallowing and neck pain.   Eyes: Negative for itching and visual disturbance.  Respiratory: Negative for cough.   Cardiovascular: Negative for chest pain, palpitations and leg swelling.  Gastrointestinal: Negative for nausea, diarrhea, blood in stool and abdominal distention.  Genitourinary: Negative for frequency and hematuria.  Musculoskeletal: Negative for back pain, joint swelling and gait problem.  Skin: Negative for rash.  Neurological: Negative for dizziness, tremors, speech difficulty and weakness.  Psychiatric/Behavioral: Negative for sleep disturbance, dysphoric mood and agitation. The patient is not nervous/anxious.        Objective:   Physical Exam  Constitutional: He is oriented to person, place, and time. He appears well-developed.  HENT:  Mouth/Throat: Oropharynx is clear and moist.  Eyes: Conjunctivae normal are normal. Pupils are equal, round, and reactive to light.  Neck: Normal range of motion. No JVD present. No thyromegaly present.  Cardiovascular: Normal rate, regular rhythm, normal heart sounds and intact distal pulses.  Exam reveals no gallop and no friction rub.     No murmur heard. Pulmonary/Chest: Effort normal and breath sounds normal. No respiratory distress. He has no wheezes. He has no rales. He exhibits no tenderness.  Abdominal: Soft. Bowel sounds are normal. He exhibits no distension and no mass. There is no tenderness. There is no rebound and no guarding.  Musculoskeletal: Normal range of motion. He exhibits no edema and no tenderness.  Lymphadenopathy:    He has no cervical adenopathy.  Neurological: He is alert and oriented to person, place, and time. He has normal reflexes. No cranial nerve deficit. He exhibits normal muscle tone. Coordination normal.  Skin: Skin is warm and dry. No rash noted.  Psychiatric: He has a normal mood and affect. His behavior is normal. Judgment and thought content normal.    Lab Results  Component Value Date   WBC 4.3* 06/30/2012   HGB 14.5 06/30/2012   HCT 42.6 06/30/2012   PLT 222.0 06/30/2012   CHOL 202* 06/30/2012   TRIG 28.0 06/30/2012   HDL 71.50 06/30/2012   LDLDIRECT 105.9 06/30/2012   ALT 26 06/30/2012   AST 29 06/30/2012   NA 141 06/30/2012   K 4.7 06/30/2012   CL 104 06/30/2012   CREATININE 1.0 06/30/2012   BUN 19 06/30/2012   CO2 30 06/30/2012   TSH 1.46 06/30/2012   PSA 0.49 06/30/2012   HGBA1C 5.3 12/02/2009          Assessment & Plan:

## 2012-07-07 NOTE — Assessment & Plan Note (Signed)
Resolved

## 2012-07-07 NOTE — Assessment & Plan Note (Signed)
We discussed age appropriate health related issues, including available/recomended screening tests and vaccinations. We discussed a need for adhering to healthy diet and exercise. Labs/EKG were reviewed/ordered. All questions were answered.   

## 2012-07-07 NOTE — Assessment & Plan Note (Signed)
Chronic Grief: 12/13 his brother was killed in a home invasion Celexa to co

## 2012-09-10 ENCOUNTER — Other Ambulatory Visit: Payer: Self-pay | Admitting: Internal Medicine

## 2012-09-10 ENCOUNTER — Telehealth: Payer: Self-pay

## 2012-09-10 NOTE — Telephone Encounter (Signed)
Pt is requesting antibiotics for a sinus infection.  He has been informed of the office policy of office visits for antibiotics.  He states he will come for an office visit if we fit it to his schedule and stay until 7:15 PM.  He was very rude.

## 2012-09-10 NOTE — Telephone Encounter (Signed)
Pt's partner called stating pt has been experiencing sxs of sinus infection x 3 days and is requesting Rx for an ABX. Pt was contact and advised of office policy- MD do not prescribe ABX without OV. Pt immediately became extremely irate stating that he cannot come today because he works, he will no be available until next 11/18/2022 and by then he will "be dead". I attempted to advise pt that many OTC medication help with sxs relief but pt stated "I work until 7 and you close at 5" and hung up.

## 2012-09-11 MED ORDER — AZITHROMYCIN 250 MG PO TABS: ORAL_TABLET | ORAL | Status: DC

## 2012-09-11 NOTE — Telephone Encounter (Signed)
Done

## 2012-09-11 NOTE — Telephone Encounter (Signed)
OK [facility] - I'll make an exception.   He can't be rude to our staff Thx

## 2012-11-17 ENCOUNTER — Other Ambulatory Visit: Payer: Self-pay | Admitting: Internal Medicine

## 2012-11-17 ENCOUNTER — Encounter: Payer: Self-pay | Admitting: Internal Medicine

## 2012-11-17 ENCOUNTER — Ambulatory Visit (INDEPENDENT_AMBULATORY_CARE_PROVIDER_SITE_OTHER): Payer: BC Managed Care – PPO | Admitting: Internal Medicine

## 2012-11-17 VITALS — BP 130/78 | HR 65 | Temp 97.1°F | Ht 73.0 in | Wt 191.2 lb

## 2012-11-17 DIAGNOSIS — M19049 Primary osteoarthritis, unspecified hand: Secondary | ICD-10-CM

## 2012-11-17 DIAGNOSIS — F329 Major depressive disorder, single episode, unspecified: Secondary | ICD-10-CM

## 2012-11-17 DIAGNOSIS — K1379 Other lesions of oral mucosa: Secondary | ICD-10-CM | POA: Insufficient documentation

## 2012-11-17 DIAGNOSIS — K1370 Unspecified lesions of oral mucosa: Secondary | ICD-10-CM

## 2012-11-17 MED ORDER — CITALOPRAM HYDROBROMIDE 10 MG PO TABS: 10.0000 mg | ORAL_TABLET | Freq: Every day | ORAL | Status: DC

## 2012-11-17 MED ORDER — DICLOFENAC SODIUM 1 % TD GEL: 2.0000 g | Freq: Four times a day (QID) | TRANSDERMAL | Status: DC

## 2012-11-17 MED ORDER — TRIAMCINOLONE ACETONIDE 0.1 % MT PSTE: PASTE | Freq: Two times a day (BID) | OROMUCOSAL | Status: DC

## 2012-11-17 NOTE — Progress Notes (Signed)
Subjective:    Mouth Lesions  The current episode started more than 2 weeks ago. The problem has been resolved. Associated symptoms include mouth sores. Pertinent negatives include no eye itching, no diarrhea, no nausea, no congestion, no sore throat, no neck pain, no cough and no rash.  C/o B hand pain Sores started from Mobic and Aleve and Tylenol ES. Sores resolved after he stopped Rx  F/u depression His brother was killed in the home invasion.  C/o hand arthritis, L thumb OA -- Dr Merlyn Lot; he had neg RA tests  F/u several episodes of irreg heart beat episodes x6 over past year (3 h long) - there was no CP or lightheadedness...  BP Readings from Last 3 Encounters:  11/17/12 130/78  07/07/12 122/90  04/07/12 128/80   Wt Readings from Last 3 Encounters:  11/17/12 191 lb 4 oz (86.75 kg)  07/07/12 189 lb (85.73 kg)  04/23/12 184 lb (83.462 kg)     Review of Systems  Constitutional: Negative for appetite change, fatigue and unexpected weight change.  HENT: Positive for mouth sores. Negative for nosebleeds, congestion, sore throat, sneezing, trouble swallowing and neck pain.   Eyes: Negative for itching and visual disturbance.  Respiratory: Negative for cough.   Cardiovascular: Negative for chest pain, palpitations and leg swelling.  Gastrointestinal: Negative for nausea, diarrhea, blood in stool and abdominal distention.  Genitourinary: Negative for frequency and hematuria.  Musculoskeletal: Negative for back pain, joint swelling and gait problem.  Skin: Negative for rash.  Neurological: Negative for dizziness, tremors, speech difficulty and weakness.  Psychiatric/Behavioral: Negative for sleep disturbance, dysphoric mood and agitation. The patient is not nervous/anxious.        Objective:   Physical Exam  Constitutional: He is oriented to person, place, and time. He appears well-developed.  HENT:  Mouth/Throat: Oropharynx is clear and moist.  Eyes: Conjunctivae are  normal. Pupils are equal, round, and reactive to light.  Neck: Normal range of motion. No JVD present. No thyromegaly present.  Cardiovascular: Normal rate, regular rhythm, normal heart sounds and intact distal pulses.  Exam reveals no gallop and no friction rub.   No murmur heard. Pulmonary/Chest: Effort normal and breath sounds normal. No respiratory distress. He has no wheezes. He has no rales. He exhibits no tenderness.  Abdominal: Soft. Bowel sounds are normal. He exhibits no distension and no mass. There is no tenderness. There is no rebound and no guarding.  Musculoskeletal: Normal range of motion. He exhibits no edema and no tenderness.  Lymphadenopathy:    He has no cervical adenopathy.  Neurological: He is alert and oriented to person, place, and time. He has normal reflexes. No cranial nerve deficit. He exhibits normal muscle tone. Coordination normal.  Skin: Skin is warm and dry. No rash noted.  Psychiatric: He has a normal mood and affect. His behavior is normal. Judgment and thought content normal.    Lab Results  Component Value Date   WBC 4.3* 06/30/2012   HGB 14.5 06/30/2012   HCT 42.6 06/30/2012   PLT 222.0 06/30/2012   CHOL 202* 06/30/2012   TRIG 28.0 06/30/2012   HDL 71.50 06/30/2012   LDLDIRECT 105.9 06/30/2012   ALT 26 06/30/2012   AST 29 06/30/2012   NA 141 06/30/2012   K 4.7 06/30/2012   CL 104 06/30/2012   CREATININE 1.0 06/30/2012   BUN 19 06/30/2012   CO2 30 06/30/2012   TSH 1.46 06/30/2012   PSA 0.49 06/30/2012   HGBA1C 5.3 12/02/2009  Assessment & Plan:

## 2012-11-17 NOTE — Assessment & Plan Note (Signed)
B hands L>R - overuse. NSAIDs intolerant Voltaren gel Limbrel is an option Rheum consult

## 2012-11-17 NOTE — Assessment & Plan Note (Signed)
Cont Celexa

## 2012-11-17 NOTE — Assessment & Plan Note (Signed)
Recurrent - poss NSAIDs related rheum ref to r/o other causes

## 2012-11-25 ENCOUNTER — Telehealth: Payer: Self-pay | Admitting: Internal Medicine

## 2012-11-25 NOTE — Telephone Encounter (Signed)
What would he like to be checked for? I need to place an order? Thx

## 2012-11-25 NOTE — Telephone Encounter (Signed)
Pt called triage requesting a stool sample kit.  He states he has mucus in his stool.  Pt has been prescribed Canasa Suppositories with some change.

## 2012-12-09 NOTE — Telephone Encounter (Signed)
Spoke with patient he states he has been seen by Dr Ky Barban in GI and further tests were done; all results were negative.

## 2013-01-19 ENCOUNTER — Other Ambulatory Visit: Payer: Self-pay | Admitting: *Deleted

## 2013-01-19 ENCOUNTER — Other Ambulatory Visit (INDEPENDENT_AMBULATORY_CARE_PROVIDER_SITE_OTHER): Payer: BC Managed Care – PPO

## 2013-01-19 DIAGNOSIS — M2550 Pain in unspecified joint: Secondary | ICD-10-CM

## 2013-01-19 LAB — URIC ACID: Uric Acid, Serum: 5.1 mg/dL (ref 4.0–7.8)

## 2013-01-19 NOTE — Progress Notes (Signed)
Pt presents with a lab order from Dr. Pollyann Savoy. Labs entered. Original given back to pt.

## 2013-04-13 DIAGNOSIS — L988 Other specified disorders of the skin and subcutaneous tissue: Secondary | ICD-10-CM | POA: Insufficient documentation

## 2013-05-18 ENCOUNTER — Other Ambulatory Visit: Payer: Self-pay | Admitting: Internal Medicine

## 2013-06-22 ENCOUNTER — Encounter: Payer: Self-pay | Admitting: Internal Medicine

## 2013-06-22 ENCOUNTER — Ambulatory Visit (INDEPENDENT_AMBULATORY_CARE_PROVIDER_SITE_OTHER): Payer: BC Managed Care – PPO | Admitting: Internal Medicine

## 2013-06-22 VITALS — BP 128/84 | HR 84 | Temp 98.1°F | Resp 16 | Ht 73.0 in | Wt 208.0 lb

## 2013-06-22 DIAGNOSIS — Z0000 Encounter for general adult medical examination without abnormal findings: Secondary | ICD-10-CM

## 2013-06-22 MED ORDER — ZOLMITRIPTAN 5 MG NA SOLN: 1.0000 | NASAL | Status: DC | PRN

## 2013-06-22 MED ORDER — ELETRIPTAN HYDROBROMIDE 40 MG PO TABS: 40.0000 mg | ORAL_TABLET | ORAL | Status: DC | PRN

## 2013-06-22 NOTE — Progress Notes (Signed)
Pre visit review using our clinic review tool, if applicable. No additional management support is needed unless otherwise documented below in the visit note. 

## 2013-06-22 NOTE — Progress Notes (Signed)
   Subjective:    HPI The patient is here for a wellness exam. The patient has been doing well overall without major physical or psychological issues going on lately. He had some " low blood sugar episodes".   His brother was killed in the home invasion 2 years ago   L thumb OA -- Dr Fredna Dow  F/u several episodes of irreg heart beat episodes x6 over past year (3 h long) - there was no CP or lightheadedness...  BP Readings from Last 3 Encounters:  06/22/13 128/84  11/17/12 130/78  07/07/12 122/90   Wt Readings from Last 3 Encounters:  06/22/13 208 lb (94.348 kg)  11/17/12 191 lb 4 oz (86.75 kg)  07/07/12 189 lb (85.73 kg)     Review of Systems  Constitutional: Negative for appetite change, fatigue and unexpected weight change.  HENT: Negative for congestion, nosebleeds, sneezing, sore throat and trouble swallowing.   Eyes: Negative for itching and visual disturbance.  Respiratory: Negative for cough.   Cardiovascular: Negative for chest pain, palpitations and leg swelling.  Gastrointestinal: Negative for nausea, diarrhea, blood in stool and abdominal distention.  Genitourinary: Negative for frequency and hematuria.  Musculoskeletal: Negative for back pain, gait problem, joint swelling and neck pain.  Skin: Negative for rash.  Neurological: Negative for dizziness, tremors, speech difficulty and weakness.  Psychiatric/Behavioral: Negative for sleep disturbance, dysphoric mood and agitation. The patient is not nervous/anxious.        Objective:   Physical Exam  Constitutional: He is oriented to person, place, and time. He appears well-developed.  HENT:  Mouth/Throat: Oropharynx is clear and moist.  Eyes: Conjunctivae are normal. Pupils are equal, round, and reactive to light.  Neck: Normal range of motion. No JVD present. No thyromegaly present.  Cardiovascular: Normal rate, regular rhythm, normal heart sounds and intact distal pulses.  Exam reveals no gallop and no friction  rub.   No murmur heard. Pulmonary/Chest: Effort normal and breath sounds normal. No respiratory distress. He has no wheezes. He has no rales. He exhibits no tenderness.  Abdominal: Soft. Bowel sounds are normal. He exhibits no distension and no mass. There is no tenderness. There is no rebound and no guarding.  Musculoskeletal: Normal range of motion. He exhibits no edema and no tenderness.  Lymphadenopathy:    He has no cervical adenopathy.  Neurological: He is alert and oriented to person, place, and time. He has normal reflexes. No cranial nerve deficit. He exhibits normal muscle tone. Coordination normal.  Skin: Skin is warm and dry. No rash noted.  Psychiatric: He has a normal mood and affect. His behavior is normal. Judgment and thought content normal.    Lab Results  Component Value Date   WBC 4.3* 06/30/2012   HGB 14.5 06/30/2012   HCT 42.6 06/30/2012   PLT 222.0 06/30/2012   CHOL 202* 06/30/2012   TRIG 28.0 06/30/2012   HDL 71.50 06/30/2012   LDLDIRECT 105.9 06/30/2012   ALT 26 06/30/2012   AST 29 06/30/2012   NA 141 06/30/2012   K 4.7 06/30/2012   CL 104 06/30/2012   CREATININE 1.0 06/30/2012   BUN 19 06/30/2012   CO2 30 06/30/2012   TSH 1.46 06/30/2012   PSA 0.49 06/30/2012   HGBA1C 5.3 12/02/2009          Assessment & Plan:

## 2013-06-22 NOTE — Patient Instructions (Signed)
Gluten free trial (no wheat products) for 4-6 weeks. OK to use gluten-free bread and gluten-free pasta.  Milk free trial (no milk, ice cream, cheese and yogurt) for 4-6 weeks. OK to use almond, coconut, rice milk. "Almond breeze" brand tastes good.  

## 2013-06-29 ENCOUNTER — Telehealth: Payer: Self-pay | Admitting: Internal Medicine

## 2013-06-29 ENCOUNTER — Other Ambulatory Visit (INDEPENDENT_AMBULATORY_CARE_PROVIDER_SITE_OTHER): Payer: BC Managed Care – PPO

## 2013-06-29 DIAGNOSIS — Z0000 Encounter for general adult medical examination without abnormal findings: Secondary | ICD-10-CM

## 2013-06-29 LAB — CBC WITH DIFFERENTIAL/PLATELET
BASOS PCT: 0.4 % (ref 0.0–3.0)
Basophils Absolute: 0 10*3/uL (ref 0.0–0.1)
EOS PCT: 2.1 % (ref 0.0–5.0)
Eosinophils Absolute: 0.1 10*3/uL (ref 0.0–0.7)
HCT: 44.4 % (ref 39.0–52.0)
Hemoglobin: 15.3 g/dL (ref 13.0–17.0)
LYMPHS PCT: 35.6 % (ref 12.0–46.0)
Lymphs Abs: 1.5 10*3/uL (ref 0.7–4.0)
MCHC: 34.3 g/dL (ref 30.0–36.0)
MCV: 92.7 fl (ref 78.0–100.0)
Monocytes Absolute: 0.3 10*3/uL (ref 0.1–1.0)
Monocytes Relative: 8 % (ref 3.0–12.0)
NEUTROS PCT: 53.9 % (ref 43.0–77.0)
Neutro Abs: 2.3 10*3/uL (ref 1.4–7.7)
Platelets: 251 10*3/uL (ref 150.0–400.0)
RBC: 4.79 Mil/uL (ref 4.22–5.81)
RDW: 12.1 % (ref 11.5–14.6)
WBC: 4.2 10*3/uL — AB (ref 4.5–10.5)

## 2013-06-29 LAB — LIPID PANEL
Cholesterol: 238 mg/dL — ABNORMAL HIGH (ref 0–200)
HDL: 66.2 mg/dL (ref >39.00)
Total CHOL/HDL Ratio: 4
Triglycerides: 24 mg/dL (ref 0.0–149.0)
VLDL: 4.8 mg/dL (ref 0.0–40.0)

## 2013-06-29 LAB — URINALYSIS
Bilirubin Urine: NEGATIVE
HGB URINE DIPSTICK: NEGATIVE
Ketones, ur: NEGATIVE
Leukocytes, UA: NEGATIVE
Nitrite: NEGATIVE
TOTAL PROTEIN, URINE-UPE24: NEGATIVE
URINE GLUCOSE: NEGATIVE
Urobilinogen, UA: 0.2 (ref 0.0–1.0)
pH: 6 (ref 5.0–8.0)

## 2013-06-29 LAB — LDL CHOLESTEROL, DIRECT: Direct LDL: 150.6 mg/dL

## 2013-06-29 LAB — HEPATIC FUNCTION PANEL
ALK PHOS: 36 U/L — AB (ref 39–117)
ALT: 22 U/L (ref 0–53)
AST: 22 U/L (ref 0–37)
Albumin: 4 g/dL (ref 3.5–5.2)
Bilirubin, Direct: 0.2 mg/dL (ref 0.0–0.3)
Total Bilirubin: 1.1 mg/dL (ref 0.3–1.2)
Total Protein: 6.6 g/dL (ref 6.0–8.3)

## 2013-06-29 LAB — BASIC METABOLIC PANEL
BUN: 11 mg/dL (ref 6–23)
CHLORIDE: 106 meq/L (ref 96–112)
CO2: 31 meq/L (ref 19–32)
Calcium: 9.5 mg/dL (ref 8.4–10.5)
Creatinine, Ser: 1 mg/dL (ref 0.4–1.5)
GFR: 88.72 mL/min (ref >60.00)
Glucose, Bld: 95 mg/dL (ref 70–99)
Potassium: 4.4 mEq/L (ref 3.5–5.1)
SODIUM: 142 meq/L (ref 135–145)

## 2013-06-29 LAB — TSH: TSH: 1.39 u[IU]/mL (ref 0.35–5.50)

## 2013-06-29 LAB — PSA: PSA: 0.52 ng/mL (ref 0.10–4.00)

## 2013-06-29 MED ORDER — SUMATRIPTAN SUCCINATE 100 MG PO TABS: 100.0000 mg | ORAL_TABLET | ORAL | Status: DC | PRN

## 2013-06-29 NOTE — Telephone Encounter (Signed)
We can try Imitrex (generic)

## 2013-06-29 NOTE — Telephone Encounter (Signed)
Patient called wanting to know if a different prescription can be called in stated the eletriptan (RELPAX) 40 MG tablet was $145- 8 tablets Said his insurance want cover it  Please advise

## 2013-06-30 MED ORDER — SUMATRIPTAN SUCCINATE 100 MG PO TABS: 100.0000 mg | ORAL_TABLET | ORAL | Status: DC | PRN

## 2013-06-30 NOTE — Telephone Encounter (Signed)
Imitrex sent. Pt informed of below.

## 2013-09-23 ENCOUNTER — Encounter: Payer: Self-pay | Admitting: Internal Medicine

## 2013-09-23 ENCOUNTER — Ambulatory Visit (INDEPENDENT_AMBULATORY_CARE_PROVIDER_SITE_OTHER): Payer: BC Managed Care – PPO | Admitting: Internal Medicine

## 2013-09-23 VITALS — BP 110/70 | HR 73 | Temp 98.3°F | Resp 13 | Wt 197.8 lb

## 2013-09-23 DIAGNOSIS — J209 Acute bronchitis, unspecified: Secondary | ICD-10-CM

## 2013-09-23 MED ORDER — BENZONATATE 200 MG PO CAPS: 200.0000 mg | ORAL_CAPSULE | Freq: Three times a day (TID) | ORAL | Status: DC | PRN

## 2013-09-23 MED ORDER — AZITHROMYCIN 250 MG PO TABS: ORAL_TABLET | ORAL | Status: DC

## 2013-09-23 NOTE — Patient Instructions (Signed)
Plain Mucinex (NOT D) for thick secretions ;force NON dairy fluids .   Nasal cleansing in the shower as discussed with lather of mild shampoo.After 10 seconds wash off lather while  exhaling through nostrils. Make sure that all residual soap is removed to prevent irritation.  Flonase OR Nasacort AQ 1 spray in each nostril twice a day as needed. Use the "crossover" technique into opposite nostril spraying toward opposite ear @ 45 degree angle, not straight up into nostril.  Use a Neti pot daily only  as needed for significant sinus congestion; going from open side to congested side . Plain Allegra (NOT D )  160 daily , Loratidine 10 mg , OR Zyrtec 10 mg @ bedtime  as needed for itchy eyes & sneezing. Carry room temperature water and sip liberally after coughing.  

## 2013-09-23 NOTE — Progress Notes (Signed)
   Subjective:    Patient ID: Glen [name], male    DOB: 1966-01-10, 48 y.o.   MRN: [id number]  HPI  Symptoms began 09/20/13 as extreme discomfort in his muscles and joints, particularly the joints. This was associated with sore throat, rhinitis, head congestion, chest congestion, and a productive cough which was initially nonproductive. He also had fever, chills, and sweats.  He was exposed to ill friends and work associates  He uses Delsym, Tylenol, Mucinex, and cold and flu over-the-counter preparations with only partial response  He quit smoking 15 years ago. He did not take the flu shot this year.  4/615 [facility]en at [facility] nasal swab negative for flu; antibiotic prescribed but not started.   At this time active symptoms include  sore throat, slight earache, cough with brown/beige sputum, sweats, and sneezing.    Review of Systems  He is not having frontal headache, facial pain, dental pain, nasal purulence, otic discharge, wheezing, or shortness of breath. The sneezing is not associated with itchy watery eyes.        Objective:   Physical Exam General appearance:thin but good health ;well nourished; no acute distress or increased work of breathing is present.  No  lymphadenopathy about the head, neck, or axilla noted.   Eyes: No conjunctival inflammation or lid edema is present.  Ears:  External ear exam shows no significant lesions or deformities.  Otoscopic examination reveals clear canals, tympanic membranes are intact bilaterally without bulging, retraction, inflammation or discharge.  Nose:  External nasal examination shows no deformity or inflammation. Nasal mucosa are pink and moist without lesions or exudates. No septal dislocation or deviation.No obstruction to airflow.   Oral exam: Dental hygiene is good; lips and gums are healthy appearing.There is uvular & oropharyngeal erythema ; no exudate noted. Hoarse  Neck:  No deformities,  masses, or tenderness noted.    Supple with full range of motion without pain.   Heart:  Normal rate and regular rhythm. S1 and S2 normal without gallop, murmur, click, rub or other extra sounds.   Lungs:Chest clear to auscultation; no wheezes, rhonchi,rales ,or rubs present.No increased work of breathing.    Extremities:  No cyanosis, edema, or clubbing  noted    Skin: Warm & dry.         Assessment & Plan:  #1 acute bronchitis w/o bronchospasm #2 no sinusitis Plan: See orders and recommendations

## 2013-09-23 NOTE — Progress Notes (Signed)
Pre visit review using our clinic review tool, if applicable. No additional management support is needed unless otherwise documented below in the visit note. 

## 2013-11-02 ENCOUNTER — Encounter: Payer: Self-pay | Admitting: Family Medicine

## 2013-11-02 ENCOUNTER — Ambulatory Visit (INDEPENDENT_AMBULATORY_CARE_PROVIDER_SITE_OTHER): Payer: BC Managed Care – PPO | Admitting: Family Medicine

## 2013-11-02 VITALS — BP 130/74 | Ht 73.0 in | Wt 200.0 lb

## 2013-11-02 DIAGNOSIS — M79672 Pain in left foot: Principal | ICD-10-CM

## 2013-11-02 DIAGNOSIS — M2140 Flat foot [pes planus] (acquired), unspecified foot: Secondary | ICD-10-CM

## 2013-11-02 DIAGNOSIS — M79609 Pain in unspecified limb: Secondary | ICD-10-CM

## 2013-11-02 DIAGNOSIS — M79671 Pain in right foot: Secondary | ICD-10-CM

## 2013-11-03 ENCOUNTER — Encounter: Payer: Self-pay | Admitting: Family Medicine

## 2013-11-03 DIAGNOSIS — M79672 Pain in left foot: Principal | ICD-10-CM

## 2013-11-03 DIAGNOSIS — M79671 Pain in right foot: Secondary | ICD-10-CM | POA: Insufficient documentation

## 2013-11-03 NOTE — Assessment & Plan Note (Signed)
New orthotics made. We'll also put him in some heel cups.

## 2013-11-03 NOTE — Progress Notes (Signed)
Patient ID: Glen [name], male   DOB: 01-15-1966, 48 y.o.   MRN: [id number]  Glen [name] - 48 y.o. male MRN [id number]  Date of birth: [birth year]    SUBJECTIVE:     Bilateral heel pain. This has been worsening over the last few months. He has custom molded orthotics that are fairly old and he wonders if they're just worn down. He would like to be evaluated for new orthotics as well as his heel pain.  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   ROS:     No unusual weight change, no numbness in the feet, no fever, sweats, chills.  PERTINENT  PMH / PSH FH / / SH:  Past Medical, Surgical, Social, and Family History Reviewed & Updated per EMR.  Pertinent Historical Findings include:  No personal history diabetes mellitus. No prior history of foot surgery or injury.  OBJECTIVE: BP 130/74  Ht 6\' 1"  (1.854 m)  Wt 200 lb (90.719 kg)  BMI 26.39 kg/m2  Physical Exam:  Vital signs are reviewed. GENERAL: Well-developed male no acute distress FEET: Bilaterally pes planus with medial foot collapse left greater than right. Plantar fascia areas nontender to palpation. He has intact sensation to soft touch bilateral feet. He has normal vascular status dorsalis pedis pulses 2+ bilaterally equal. Mild tenderness to palpation at the posterior edge of the heel bilaterally. There is no ecchymosis here; there is some mild amount of callus.   Patient was fitted for a : standard, cushioned, semi-rigid orthotic. The orthotic was heated, placed on the orthotic stand. The patient was positioned in subtalar neutral position and 10 degrees of ankle dorsiflexion in a weight bearing stance on the heated orthotic blank After completion of molding, a stable base was applied to the orthotic blank. The blank was ground to a stable position for weight bearing. Blank: Red foam Base: Blue foam Posting: None. The base was ground to that he has more medial heel support and lateral to put him in a more neutral position.    Face to face time spent in evaluation, measurement and manufacture of custom molded orthotic was 40 minutes.  ASSESSMENT & PLAN:  See problem based charting & AVS for pt instructions.

## 2013-11-16 ENCOUNTER — Other Ambulatory Visit: Payer: Self-pay | Admitting: Internal Medicine

## 2013-11-19 ENCOUNTER — Other Ambulatory Visit: Payer: Self-pay | Admitting: Internal Medicine

## 2013-11-19 DIAGNOSIS — R1013 Epigastric pain: Secondary | ICD-10-CM

## 2013-11-20 ENCOUNTER — Telehealth: Payer: Self-pay | Admitting: *Deleted

## 2013-11-20 DIAGNOSIS — G8929 Other chronic pain: Secondary | ICD-10-CM

## 2013-11-20 DIAGNOSIS — R1013 Epigastric pain: Principal | ICD-10-CM

## 2013-11-20 NOTE — Telephone Encounter (Signed)
Ok changed Thx

## 2013-11-20 NOTE — Telephone Encounter (Signed)
Pt called states he is not wanting the H Pylori test as a blood draw.  He is wanting it as a stool sample.  I called the lab the stool test is H Pylori Antigen, test code [phone].  Please advise

## 2013-11-23 NOTE — Telephone Encounter (Signed)
Left detailed message on VM of MDs response. 

## 2013-11-24 ENCOUNTER — Other Ambulatory Visit: Payer: Self-pay | Admitting: *Deleted

## 2013-11-24 ENCOUNTER — Other Ambulatory Visit: Payer: BC Managed Care – PPO

## 2013-11-24 DIAGNOSIS — R1013 Epigastric pain: Secondary | ICD-10-CM

## 2013-11-26 LAB — H. PYLORI ANTIGEN, STOOL: H PYLORI AG STL: NEGATIVE

## 2013-12-14 ENCOUNTER — Other Ambulatory Visit: Payer: Self-pay | Admitting: Internal Medicine

## 2014-04-13 ENCOUNTER — Telehealth: Payer: Self-pay | Admitting: Internal Medicine

## 2014-04-13 MED ORDER — ZOLMITRIPTAN 5 MG NA SOLN: 1.0000 | NASAL | Status: DC | PRN

## 2014-04-13 NOTE — Telephone Encounter (Signed)
Is requesting a script for a headache med that Dr. Alain Marion gave samples of (zomig).  Patient uses [facility] on [address].

## 2014-04-13 NOTE — Telephone Encounter (Signed)
Pt call bck he stated md gave him sample zomig nasal spray. Since we no longer get samples he is wanting a rx sent to pharmacy. Inform [facility],,,/lmb

## 2014-04-13 NOTE — Telephone Encounter (Signed)
Called pt no answer LMOM RTC.../lmb 

## 2014-06-16 ENCOUNTER — Other Ambulatory Visit: Payer: Self-pay | Admitting: Internal Medicine

## 2014-06-28 ENCOUNTER — Other Ambulatory Visit: Payer: BLUE CROSS/BLUE SHIELD

## 2014-06-28 ENCOUNTER — Encounter: Payer: Self-pay | Admitting: Internal Medicine

## 2014-06-28 ENCOUNTER — Other Ambulatory Visit (INDEPENDENT_AMBULATORY_CARE_PROVIDER_SITE_OTHER): Payer: BLUE CROSS/BLUE SHIELD

## 2014-06-28 ENCOUNTER — Ambulatory Visit (INDEPENDENT_AMBULATORY_CARE_PROVIDER_SITE_OTHER): Payer: BLUE CROSS/BLUE SHIELD | Admitting: Internal Medicine

## 2014-06-28 VITALS — BP 128/72 | HR 74 | Temp 98.2°F | Ht 73.0 in | Wt 205.0 lb

## 2014-06-28 DIAGNOSIS — Z0000 Encounter for general adult medical examination without abnormal findings: Secondary | ICD-10-CM

## 2014-06-28 DIAGNOSIS — Z23 Encounter for immunization: Secondary | ICD-10-CM

## 2014-06-28 DIAGNOSIS — R1013 Epigastric pain: Secondary | ICD-10-CM

## 2014-06-28 LAB — CBC WITH DIFFERENTIAL/PLATELET
Basophils Absolute: 0 10*3/uL (ref 0.0–0.1)
Basophils Relative: 0.6 % (ref 0.0–3.0)
Eosinophils Absolute: 0.1 10*3/uL (ref 0.0–0.7)
Eosinophils Relative: 1.3 % (ref 0.0–5.0)
HEMATOCRIT: 44 % (ref 39.0–52.0)
HEMOGLOBIN: 15.1 g/dL (ref 13.0–17.0)
Lymphocytes Relative: 34 % (ref 12.0–46.0)
Lymphs Abs: 1.6 10*3/uL (ref 0.7–4.0)
MCHC: 34.3 g/dL (ref 30.0–36.0)
MCV: 91 fl (ref 78.0–100.0)
MONO ABS: 0.4 10*3/uL (ref 0.1–1.0)
Monocytes Relative: 8.3 % (ref 3.0–12.0)
NEUTROS ABS: 2.7 10*3/uL (ref 1.4–7.7)
Neutrophils Relative %: 55.8 % (ref 43.0–77.0)
Platelets: 280 10*3/uL (ref 150.0–400.0)
RBC: 4.84 Mil/uL (ref 4.22–5.81)
RDW: 12.6 % (ref 11.5–15.5)
WBC: 4.8 10*3/uL (ref 4.0–10.5)

## 2014-06-28 LAB — URINALYSIS
BILIRUBIN URINE: NEGATIVE
Ketones, ur: NEGATIVE
Leukocytes, UA: NEGATIVE
Nitrite: NEGATIVE
PH: 6 (ref 5.0–8.0)
Specific Gravity, Urine: 1.02 (ref 1.000–1.030)
TOTAL PROTEIN, URINE-UPE24: NEGATIVE
UROBILINOGEN UA: 0.2 (ref 0.0–1.0)
Urine Glucose: NEGATIVE

## 2014-06-28 LAB — HEPATIC FUNCTION PANEL
ALT: 19 U/L (ref 0–53)
AST: 23 U/L (ref 0–37)
Albumin: 4.1 g/dL (ref 3.5–5.2)
Alkaline Phosphatase: 42 U/L (ref 39–117)
Bilirubin, Direct: 0.1 mg/dL (ref 0.0–0.3)
Total Bilirubin: 1.1 mg/dL (ref 0.2–1.2)
Total Protein: 6.9 g/dL (ref 6.0–8.3)

## 2014-06-28 LAB — BASIC METABOLIC PANEL
BUN: 16 mg/dL (ref 6–23)
CALCIUM: 9.6 mg/dL (ref 8.4–10.5)
CHLORIDE: 104 meq/L (ref 96–112)
CO2: 29 mEq/L (ref 19–32)
CREATININE: 0.9 mg/dL (ref 0.4–1.5)
GFR: 97.69 mL/min (ref >60.00)
Glucose, Bld: 97 mg/dL (ref 70–99)
Potassium: 4.3 mEq/L (ref 3.5–5.1)
Sodium: 140 mEq/L (ref 135–145)

## 2014-06-28 LAB — LIPID PANEL
CHOLESTEROL: 256 mg/dL — AB (ref 0–200)
HDL: 55.8 mg/dL (ref >39.00)
LDL Cholesterol: 186 mg/dL — ABNORMAL HIGH (ref 0–99)
NonHDL: 200.2
TRIGLYCERIDES: 72 mg/dL (ref 0.0–149.0)
Total CHOL/HDL Ratio: 5
VLDL: 14.4 mg/dL (ref 0.0–40.0)

## 2014-06-28 LAB — TSH: TSH: 1.05 u[IU]/mL (ref 0.35–4.50)

## 2014-06-28 LAB — H. PYLORI ANTIBODY, IGG: H Pylori IgG: NEGATIVE

## 2014-06-28 LAB — PSA: PSA: 0.67 ng/mL (ref 0.10–4.00)

## 2014-06-28 MED ORDER — ZOLMITRIPTAN 5 MG PO TABS: 5.0000 mg | ORAL_TABLET | ORAL | Status: DC | PRN

## 2014-06-28 MED ORDER — ZOLMITRIPTAN 5 MG NA SOLN: 1.0000 | NASAL | Status: DC | PRN

## 2014-06-28 NOTE — Assessment & Plan Note (Signed)
We discussed age appropriate health related issues, including available/recomended screening tests and vaccinations. We discussed a need for adhering to healthy diet and exercise. Labs/EKG were reviewed/ordered. All questions were answered.   

## 2014-06-28 NOTE — Progress Notes (Signed)
   Subjective:    HPI The patient is here for a wellness exam. The patient has been doing well overall without major physical or psychological issues going on lately. He had some " low blood sugar episodes".   His brother was killed in the home invasion 2 years ago   L thumb OA -- Dr Fredna Dow  F/u several episodes of irreg heart beat episodes x6 over past year (3 h long) - there was no CP or lightheadedness...  BP Readings from Last 3 Encounters:  06/28/14 128/72  11/02/13 130/74  09/23/13 110/70   Wt Readings from Last 3 Encounters:  06/28/14 205 lb (92.987 kg)  11/02/13 200 lb (90.719 kg)  09/23/13 197 lb 12.8 oz (89.721 kg)     Review of Systems  Constitutional: Negative for appetite change, fatigue and unexpected weight change.  HENT: Negative for congestion, nosebleeds, sneezing, sore throat and trouble swallowing.   Eyes: Negative for itching and visual disturbance.  Respiratory: Negative for cough.   Cardiovascular: Negative for chest pain, palpitations and leg swelling.  Gastrointestinal: Negative for nausea, diarrhea, blood in stool and abdominal distention.  Genitourinary: Negative for frequency and hematuria.  Musculoskeletal: Negative for back pain, joint swelling, gait problem and neck pain.  Skin: Negative for rash.  Neurological: Negative for dizziness, tremors, speech difficulty and weakness.  Psychiatric/Behavioral: Negative for sleep disturbance, dysphoric mood and agitation. The patient is not nervous/anxious.        Objective:   Physical Exam  Constitutional: He is oriented to person, place, and time. He appears well-developed. No distress.  NAD  HENT:  Mouth/Throat: Oropharynx is clear and moist.  Eyes: Conjunctivae are normal. Pupils are equal, round, and reactive to light.  Neck: Normal range of motion. No JVD present. No thyromegaly present.  Cardiovascular: Normal rate, regular rhythm, normal heart sounds and intact distal pulses.  Exam reveals no  gallop and no friction rub.   No murmur heard. Pulmonary/Chest: Effort normal and breath sounds normal. No respiratory distress. He has no wheezes. He has no rales. He exhibits no tenderness.  Abdominal: Soft. Bowel sounds are normal. He exhibits no distension and no mass. There is no tenderness. There is no rebound and no guarding.  Musculoskeletal: Normal range of motion. He exhibits no edema or tenderness.  Lymphadenopathy:    He has no cervical adenopathy.  Neurological: He is alert and oriented to person, place, and time. He has normal reflexes. No cranial nerve deficit. He exhibits normal muscle tone. He displays a negative Romberg sign. Coordination and gait normal.  No meningeal signs  Skin: Skin is warm and dry. No rash noted.  Psychiatric: He has a normal mood and affect. His behavior is normal. Judgment and thought content normal.    Lab Results  Component Value Date   WBC 4.2* 06/29/2013   HGB 15.3 06/29/2013   HCT 44.4 06/29/2013   PLT 251.0 06/29/2013   CHOL 238* 06/29/2013   TRIG 24.0 06/29/2013   HDL 66.20 06/29/2013   LDLDIRECT 150.6 06/29/2013   ALT 22 06/29/2013   AST 22 06/29/2013   NA 142 06/29/2013   K 4.4 06/29/2013   CL 106 06/29/2013   CREATININE 1.0 06/29/2013   BUN 11 06/29/2013   CO2 31 06/29/2013   TSH 1.39 06/29/2013   PSA 0.52 06/29/2013   HGBA1C 5.3 12/02/2009          Assessment & Plan:

## 2014-06-28 NOTE — Progress Notes (Signed)
Pre visit review using our clinic review tool, if applicable. No additional management support is needed unless otherwise documented below in the visit note. 

## 2014-08-16 ENCOUNTER — Other Ambulatory Visit: Payer: Self-pay | Admitting: Internal Medicine

## 2014-08-25 ENCOUNTER — Telehealth: Payer: Self-pay | Admitting: *Deleted

## 2014-08-25 ENCOUNTER — Other Ambulatory Visit: Payer: Self-pay | Admitting: *Deleted

## 2014-08-25 MED ORDER — VALACYCLOVIR HCL 500 MG PO TABS: 500.0000 mg | ORAL_TABLET | Freq: Two times a day (BID) | ORAL | Status: DC

## 2014-08-25 NOTE — Telephone Encounter (Signed)
Left msg on triage stating need refill on fever blister medicine. Called pt verified which pharmacy sending valtrex to [place]...[facility]

## 2014-12-08 ENCOUNTER — Telehealth: Payer: Self-pay | Admitting: Internal Medicine

## 2014-12-08 DIAGNOSIS — E7800 Pure hypercholesterolemia, unspecified: Secondary | ICD-10-CM

## 2014-12-08 NOTE — Telephone Encounter (Signed)
Lipid panel ordered. Pt informed

## 2014-12-08 NOTE — Telephone Encounter (Signed)
Ok Thx 

## 2014-12-08 NOTE — Telephone Encounter (Signed)
Total cholesterol and LDL were elevated back in 06/2014. You said then we would recheck labs in 6 months. Please advise what labs you want rechecked besides Lipids.

## 2014-12-08 NOTE — Telephone Encounter (Signed)
Patient would like to come in and get his blood drawn, for his cholesterol, please advise on what to do.

## 2014-12-13 ENCOUNTER — Other Ambulatory Visit (INDEPENDENT_AMBULATORY_CARE_PROVIDER_SITE_OTHER): Payer: BLUE CROSS/BLUE SHIELD

## 2014-12-13 DIAGNOSIS — E7800 Pure hypercholesterolemia, unspecified: Secondary | ICD-10-CM

## 2014-12-13 LAB — LIPID PANEL
CHOLESTEROL: 198 mg/dL (ref 0–200)
HDL: 59.4 mg/dL (ref >39.00)
LDL CALC: 130 mg/dL — AB (ref 0–99)
NONHDL: 138.6
Total CHOL/HDL Ratio: 3
Triglycerides: 44 mg/dL (ref 0.0–149.0)
VLDL: 8.8 mg/dL (ref 0.0–40.0)

## 2014-12-14 ENCOUNTER — Encounter: Payer: Self-pay | Admitting: Internal Medicine

## 2014-12-15 ENCOUNTER — Telehealth: Payer: Self-pay | Admitting: Internal Medicine

## 2014-12-15 NOTE — Telephone Encounter (Signed)
Please call patient regarding his lab results

## 2014-12-15 NOTE — Telephone Encounter (Signed)
See 12/14/14 patient email from patient to PCP. Patient was informed of his results via MyChart message.

## 2015-02-11 ENCOUNTER — Other Ambulatory Visit: Payer: Self-pay | Admitting: Internal Medicine

## 2015-02-11 NOTE — Telephone Encounter (Signed)
Patients last office visit jan/2016----please advise, thanks

## 2015-02-15 NOTE — Telephone Encounter (Signed)
celexa rx phoned in to [facility]

## 2015-06-27 ENCOUNTER — Encounter: Payer: BLUE CROSS/BLUE SHIELD | Admitting: Internal Medicine

## 2015-07-11 ENCOUNTER — Encounter: Payer: Self-pay | Admitting: Internal Medicine

## 2015-07-11 ENCOUNTER — Ambulatory Visit (INDEPENDENT_AMBULATORY_CARE_PROVIDER_SITE_OTHER): Payer: BLUE CROSS/BLUE SHIELD | Admitting: Internal Medicine

## 2015-07-11 VITALS — BP 120/76 | HR 76 | Ht 73.0 in | Wt 193.0 lb

## 2015-07-11 DIAGNOSIS — M79672 Pain in left foot: Secondary | ICD-10-CM | POA: Diagnosis not present

## 2015-07-11 DIAGNOSIS — K6289 Other specified diseases of anus and rectum: Secondary | ICD-10-CM

## 2015-07-11 DIAGNOSIS — F411 Generalized anxiety disorder: Secondary | ICD-10-CM

## 2015-07-11 DIAGNOSIS — K219 Gastro-esophageal reflux disease without esophagitis: Secondary | ICD-10-CM

## 2015-07-11 DIAGNOSIS — E559 Vitamin D deficiency, unspecified: Secondary | ICD-10-CM | POA: Diagnosis not present

## 2015-07-11 DIAGNOSIS — K5909 Other constipation: Secondary | ICD-10-CM

## 2015-07-11 DIAGNOSIS — M79671 Pain in right foot: Secondary | ICD-10-CM | POA: Diagnosis not present

## 2015-07-11 DIAGNOSIS — Z0000 Encounter for general adult medical examination without abnormal findings: Secondary | ICD-10-CM | POA: Diagnosis not present

## 2015-07-11 MED ORDER — VITAMIN D3 50 MCG (2000 UT) PO CAPS
2000.0000 [IU] | ORAL_CAPSULE | Freq: Every day | ORAL | Status: DC
Start: 2015-07-11 — End: 2021-02-21

## 2015-07-11 MED ORDER — OMEPRAZOLE 40 MG PO CPDR: 40.0000 mg | DELAYED_RELEASE_CAPSULE | Freq: Every day | ORAL | Status: DC

## 2015-07-11 MED ORDER — LINACLOTIDE 145 MCG PO CAPS: 145.0000 ug | ORAL_CAPSULE | ORAL | Status: DC

## 2015-07-11 NOTE — Progress Notes (Signed)
Subjective:  Patient ID: Glen [name], male    DOB: May 20, 1966  Age: 50 y.o. MRN: [id number]  CC: Annual Exam   HPI MARO [name] presents for well exam. Seeing Dr Earlean Shawl  regular  C/o B heel pain - seeing Dr Oneida Alar  Outpatient Prescriptions Prior to Visit  Medication Sig Dispense Refill  . citalopram (CELEXA) 10 MG tablet TAKE ONE TABLET BY MOUTH ONCE DAILY 30 tablet 5  . diclofenac sodium (VOLTAREN) 1 % GEL Apply 2 g topically 4 (four) times daily. 200 g 3  . propranolol (INDERAL) 20 MG tablet Take 1-2 tablets (20-40 mg total) by mouth 2 (two) times daily as needed (palpitations). 60 tablet 3  . triamcinolone (KENALOG) 0.1 % paste Place onto teeth 2 (two) times daily. 5 g 1  . valACYclovir (VALTREX) 500 MG tablet Take 1 tablet (500 mg total) by mouth 2 (two) times daily. Prn fever blisters 10 tablet 1  . zolmitriptan (ZOMIG) 5 MG nasal solution Place 1 spray into the nose as needed for migraine. 6 Units 3  . zolmitriptan (ZOMIG) 5 MG tablet Take 1 tablet (5 mg total) by mouth as needed for migraine. 10 tablet 3  . ALPRAZolam (XANAX) 0.25 MG tablet Take 0.25 mg by mouth 2 (two) times daily as needed.     No facility-administered medications prior to visit.    ROS Review of Systems  Constitutional: Negative for appetite change, fatigue and unexpected weight change.  HENT: Negative for congestion, nosebleeds, sneezing, sore throat and trouble swallowing.   Eyes: Negative for itching and visual disturbance.  Respiratory: Negative for cough.   Cardiovascular: Negative for chest pain, palpitations and leg swelling.  Gastrointestinal: Negative for nausea, diarrhea, blood in stool and abdominal distention.  Genitourinary: Negative for frequency and hematuria.  Musculoskeletal: Negative for back pain, joint swelling, gait problem and neck pain.  Skin: Negative for rash.  Neurological: Negative for dizziness, tremors, speech difficulty and weakness.  Psychiatric/Behavioral: Negative  for suicidal ideas, sleep disturbance, dysphoric mood and agitation. The patient is not nervous/anxious.     Objective:  BP 120/76 mmHg  Pulse 76  Ht 6\' 1"  (1.854 m)  Wt 193 lb (87.544 kg)  BMI 25.47 kg/m2  SpO2 97%  BP Readings from Last 3 Encounters:  07/11/15 120/76  06/28/14 128/72  11/02/13 130/74    Wt Readings from Last 3 Encounters:  07/11/15 193 lb (87.544 kg)  06/28/14 205 lb (92.987 kg)  11/02/13 200 lb (90.719 kg)    Physical Exam  Constitutional: He is oriented to person, place, and time. He appears well-developed and well-nourished. No distress.  HENT:  Head: Normocephalic and atraumatic.  Right Ear: External ear normal.  Left Ear: External ear normal.  Nose: Nose normal.  Mouth/Throat: Oropharynx is clear and moist. No oropharyngeal exudate.  Eyes: Conjunctivae and EOM are normal. Pupils are equal, round, and reactive to light. Right eye exhibits no discharge. Left eye exhibits no discharge. No scleral icterus.  Neck: Normal range of motion. Neck supple. No JVD present. No tracheal deviation present. No thyromegaly present.  Cardiovascular: Normal rate, regular rhythm, normal heart sounds and intact distal pulses.  Exam reveals no gallop and no friction rub.   No murmur heard. Pulmonary/Chest: Effort normal and breath sounds normal. No stridor. No respiratory distress. He has no wheezes. He has no rales. He exhibits no tenderness.  Abdominal: Soft. Bowel sounds are normal. He exhibits no distension and no mass. There is no tenderness. There is no  rebound and no guarding.  Genitourinary: Rectum normal, prostate normal and penis normal. Guaiac negative stool. No penile tenderness.  Musculoskeletal: Normal range of motion. He exhibits no edema or tenderness.  Lymphadenopathy:    He has no cervical adenopathy.  Neurological: He is alert and oriented to person, place, and time. He has normal reflexes. No cranial nerve deficit. He exhibits normal muscle tone.  Coordination normal.  Skin: Skin is warm and dry. No rash noted. He is not diaphoretic. No erythema. No pallor.  Psychiatric: He has a normal mood and affect. His behavior is normal. Judgment and thought content normal.  L foot small callus removed B heels NT  Lab Results  Component Value Date   WBC 4.5 07/12/2015   HGB 15.9 07/12/2015   HCT 46.8 07/12/2015   PLT 245.0 07/12/2015   GLUCOSE 91 07/12/2015   CHOL 218* 07/12/2015   TRIG 49.0 07/12/2015   HDL 72.60 07/12/2015   LDLDIRECT 150.6 06/29/2013   LDLCALC 135* 07/12/2015   ALT 20 07/12/2015   AST 18 07/12/2015   NA 141 07/12/2015   K 4.6 07/12/2015   CL 103 07/12/2015   CREATININE 0.92 07/12/2015   BUN 17 07/12/2015   CO2 30 07/12/2015   TSH 1.47 07/12/2015   PSA 1.05 07/12/2015   HGBA1C 5.3 12/02/2009    No results found.  Assessment & Plan:   Haik was seen today for annual exam.  Diagnoses and all orders for this visit:  Well adult exam -     Testosterone; Future -     TSH; Future -     PSA; Future -     Ammonia; Future -     Basic metabolic panel; Future -     CBC with Differential/Platelet; Future -     Hepatic function panel; Future -     Lipid panel; Future -     Urinalysis; Future -     Vitamin B12; Future -     VITAMIN D 25 Hydroxy (Vit-D Deficiency, Fractures); Future  RECTAL PAIN -     Testosterone; Future -     TSH; Future -     PSA; Future -     Ammonia; Future -     Basic metabolic panel; Future -     CBC with Differential/Platelet; Future -     Hepatic function panel; Future -     Lipid panel; Future -     Urinalysis; Future -     Vitamin B12; Future -     VITAMIN D 25 Hydroxy (Vit-D Deficiency, Fractures); Future  Anxiety state -     Testosterone; Future -     TSH; Future -     PSA; Future -     Ammonia; Future -     Basic metabolic panel; Future -     CBC with Differential/Platelet; Future -     Hepatic function panel; Future -     Lipid panel; Future -     Urinalysis;  Future -     Vitamin B12; Future -     VITAMIN D 25 Hydroxy (Vit-D Deficiency, Fractures); Future  Other orders -     Cholecalciferol (VITAMIN D3) 2000 units capsule; Take 1 capsule (2,000 Units total) by mouth daily. -     omeprazole (PRILOSEC) 40 MG capsule; Take 1 capsule (40 mg total) by mouth daily. -     Linaclotide (LINZESS) 145 MCG CAPS capsule; Take 1 capsule (145 mcg total) by mouth 1 day  or 1 dose.  I am having Mr. Halverson start on Vitamin D3, omeprazole, and Linaclotide. I am also having him maintain his ALPRAZolam, propranolol, triamcinolone, diclofenac sodium, zolmitriptan, zolmitriptan, valACYclovir, and citalopram.  Meds ordered this encounter  Medications  . Cholecalciferol (VITAMIN D3) 2000 units capsule    Sig: Take 1 capsule (2,000 Units total) by mouth daily.    Dispense:  100 capsule    Refill:  3  . omeprazole (PRILOSEC) 40 MG capsule    Sig: Take 1 capsule (40 mg total) by mouth daily.    Dispense:  30 capsule    Refill:  3  . Linaclotide (LINZESS) 145 MCG CAPS capsule    Sig: Take 1 capsule (145 mcg total) by mouth 1 day or 1 dose.    Dispense:  90 capsule    Refill:  3     Follow-up: Return in about 1 year (around 07/10/2016) for Wellness Exam.  Walker Kehr, MD

## 2015-07-11 NOTE — Progress Notes (Signed)
Pre visit review using our clinic review tool, if applicable. No additional management support is needed unless otherwise documented below in the visit note. 

## 2015-07-11 NOTE — Assessment & Plan Note (Addendum)
We discussed age appropriate health related issues, including available/recomended screening tests and vaccinations. We discussed a need for adhering to healthy diet and exercise. Labs/EKG were reviewed/ordered. All questions were answered. Colon per Dr Earlean Shawl

## 2015-07-11 NOTE — Assessment & Plan Note (Signed)
Xanax prn 

## 2015-07-11 NOTE — Assessment & Plan Note (Signed)
Constipation Dr Luanne Bras to try

## 2015-07-12 ENCOUNTER — Other Ambulatory Visit (INDEPENDENT_AMBULATORY_CARE_PROVIDER_SITE_OTHER): Payer: BLUE CROSS/BLUE SHIELD

## 2015-07-12 DIAGNOSIS — K6289 Other specified diseases of anus and rectum: Secondary | ICD-10-CM

## 2015-07-12 DIAGNOSIS — E559 Vitamin D deficiency, unspecified: Secondary | ICD-10-CM | POA: Insufficient documentation

## 2015-07-12 DIAGNOSIS — K5900 Constipation, unspecified: Secondary | ICD-10-CM | POA: Insufficient documentation

## 2015-07-12 DIAGNOSIS — Z0000 Encounter for general adult medical examination without abnormal findings: Secondary | ICD-10-CM

## 2015-07-12 DIAGNOSIS — F411 Generalized anxiety disorder: Secondary | ICD-10-CM | POA: Diagnosis not present

## 2015-07-12 LAB — HEPATIC FUNCTION PANEL
ALT: 20 U/L (ref 0–53)
AST: 18 U/L (ref 0–37)
Albumin: 4.3 g/dL (ref 3.5–5.2)
Alkaline Phosphatase: 41 U/L (ref 39–117)
BILIRUBIN TOTAL: 0.8 mg/dL (ref 0.2–1.2)
Bilirubin, Direct: 0.1 mg/dL (ref 0.0–0.3)
TOTAL PROTEIN: 6.7 g/dL (ref 6.0–8.3)

## 2015-07-12 LAB — VITAMIN D 25 HYDROXY (VIT D DEFICIENCY, FRACTURES): VITD: 29.55 ng/mL — ABNORMAL LOW (ref 30.00–100.00)

## 2015-07-12 LAB — BASIC METABOLIC PANEL
BUN: 17 mg/dL (ref 6–23)
CHLORIDE: 103 meq/L (ref 96–112)
CO2: 30 mEq/L (ref 19–32)
Calcium: 9.4 mg/dL (ref 8.4–10.5)
Creatinine, Ser: 0.92 mg/dL (ref 0.40–1.50)
GFR: 92.41 mL/min (ref >60.00)
Glucose, Bld: 91 mg/dL (ref 70–99)
POTASSIUM: 4.6 meq/L (ref 3.5–5.1)
SODIUM: 141 meq/L (ref 135–145)

## 2015-07-12 LAB — CBC WITH DIFFERENTIAL/PLATELET
BASOS PCT: 0.5 % (ref 0.0–3.0)
Basophils Absolute: 0 10*3/uL (ref 0.0–0.1)
EOS PCT: 2.2 % (ref 0.0–5.0)
Eosinophils Absolute: 0.1 10*3/uL (ref 0.0–0.7)
HEMATOCRIT: 46.8 % (ref 39.0–52.0)
HEMOGLOBIN: 15.9 g/dL (ref 13.0–17.0)
LYMPHS PCT: 34.8 % (ref 12.0–46.0)
Lymphs Abs: 1.6 10*3/uL (ref 0.7–4.0)
MCHC: 34 g/dL (ref 30.0–36.0)
MCV: 91.6 fl (ref 78.0–100.0)
MONO ABS: 0.3 10*3/uL (ref 0.1–1.0)
MONOS PCT: 7.7 % (ref 3.0–12.0)
Neutro Abs: 2.5 10*3/uL (ref 1.4–7.7)
Neutrophils Relative %: 54.8 % (ref 43.0–77.0)
Platelets: 245 10*3/uL (ref 150.0–400.0)
RBC: 5.11 Mil/uL (ref 4.22–5.81)
RDW: 13.1 % (ref 11.5–15.5)
WBC: 4.5 10*3/uL (ref 4.0–10.5)

## 2015-07-12 LAB — VITAMIN B12: VITAMIN B 12: 588 pg/mL (ref 211–911)

## 2015-07-12 LAB — LIPID PANEL
CHOL/HDL RATIO: 3
CHOLESTEROL: 218 mg/dL — AB (ref 0–200)
HDL: 72.6 mg/dL (ref >39.00)
LDL CALC: 135 mg/dL — AB (ref 0–99)
NonHDL: 145.13
Triglycerides: 49 mg/dL (ref 0.0–149.0)
VLDL: 9.8 mg/dL (ref 0.0–40.0)

## 2015-07-12 LAB — TESTOSTERONE: Testosterone: 527.84 ng/dL (ref 300.00–890.00)

## 2015-07-12 LAB — URINALYSIS
Bilirubin Urine: NEGATIVE
HGB URINE DIPSTICK: NEGATIVE
KETONES UR: NEGATIVE
LEUKOCYTES UA: NEGATIVE
Nitrite: NEGATIVE
Specific Gravity, Urine: 1.025 (ref 1.000–1.030)
Total Protein, Urine: NEGATIVE
URINE GLUCOSE: NEGATIVE
UROBILINOGEN UA: 0.2 (ref 0.0–1.0)
pH: 6 (ref 5.0–8.0)

## 2015-07-12 LAB — AMMONIA: AMMONIA: 28 umol/L (ref 11–35)

## 2015-07-12 LAB — TSH: TSH: 1.47 u[IU]/mL (ref 0.35–4.50)

## 2015-07-12 LAB — PSA: PSA: 1.05 ng/mL (ref 0.10–4.00)

## 2015-07-12 MED ORDER — ERGOCALCIFEROL 1.25 MG (50000 UT) PO CAPS: 50000.0000 [IU] | ORAL_CAPSULE | ORAL | Status: DC

## 2015-07-12 NOTE — Assessment & Plan Note (Addendum)
F/u w/Dr Oneida Alar Job related - standing at work all day Glen [name]

## 2015-07-12 NOTE — Addendum Note (Signed)
Addended by: Cassandria Anger on: 07/12/2015 10:53 PM   Modules accepted: Miquel Dunn

## 2015-07-12 NOTE — Assessment & Plan Note (Signed)
IBS-C try Linzess

## 2015-07-12 NOTE — Assessment & Plan Note (Signed)
start Vit D prescription 50000 iu weekly x4 followed by over-the-counter Vit D 2000 iu daily.

## 2015-08-11 ENCOUNTER — Other Ambulatory Visit: Payer: Self-pay | Admitting: Internal Medicine

## 2015-08-12 ENCOUNTER — Other Ambulatory Visit: Payer: Self-pay | Admitting: Internal Medicine

## 2015-10-14 ENCOUNTER — Encounter: Payer: Self-pay | Admitting: Internal Medicine

## 2015-12-08 ENCOUNTER — Other Ambulatory Visit: Payer: Self-pay | Admitting: Internal Medicine

## 2016-01-23 ENCOUNTER — Other Ambulatory Visit: Payer: Self-pay | Admitting: Internal Medicine

## 2016-01-23 NOTE — Telephone Encounter (Signed)
Please advise, thanks.

## 2016-05-18 ENCOUNTER — Telehealth: Payer: Self-pay | Admitting: Emergency Medicine

## 2016-05-18 DIAGNOSIS — R002 Palpitations: Secondary | ICD-10-CM

## 2016-05-18 NOTE — Telephone Encounter (Signed)
error 

## 2016-05-18 NOTE — Telephone Encounter (Signed)
Pt called and wants to know if he can get a referral to cardiology. He stated he spoke to you about his heart feeling like its beating out of his chest at night at his last visit. With the heart problems in his family he thought seeing a cardiologist would be best. Please advise thanks.

## 2016-05-21 NOTE — Telephone Encounter (Signed)
Ok Thx 

## 2016-07-03 ENCOUNTER — Other Ambulatory Visit: Payer: Self-pay | Admitting: Internal Medicine

## 2016-07-03 ENCOUNTER — Encounter: Payer: Self-pay | Admitting: Internal Medicine

## 2016-07-03 MED ORDER — TRAMADOL HCL 50 MG PO TABS: 50.0000 mg | ORAL_TABLET | Freq: Three times a day (TID) | ORAL | 0 refills | Status: DC | PRN

## 2016-07-06 ENCOUNTER — Ambulatory Visit (INDEPENDENT_AMBULATORY_CARE_PROVIDER_SITE_OTHER): Payer: Self-pay | Admitting: Internal Medicine

## 2016-07-06 ENCOUNTER — Encounter: Payer: Self-pay | Admitting: Internal Medicine

## 2016-07-06 ENCOUNTER — Ambulatory Visit (INDEPENDENT_AMBULATORY_CARE_PROVIDER_SITE_OTHER)
Admission: RE | Admit: 2016-07-06 | Discharge: 2016-07-06 | Disposition: A | Discharge status: Home / Self Care | Payer: Self-pay | Source: Ambulatory Visit | Attending: Internal Medicine | Admitting: Internal Medicine

## 2016-07-06 VITALS — BP 132/72 | HR 69 | Temp 97.9°F | Resp 16 | Ht 74.0 in | Wt 195.1 lb

## 2016-07-06 DIAGNOSIS — R05 Cough: Secondary | ICD-10-CM

## 2016-07-06 DIAGNOSIS — R059 Cough, unspecified: Secondary | ICD-10-CM

## 2016-07-06 DIAGNOSIS — M545 Low back pain: Secondary | ICD-10-CM

## 2016-07-06 DIAGNOSIS — G8929 Other chronic pain: Secondary | ICD-10-CM

## 2016-07-06 DIAGNOSIS — J209 Acute bronchitis, unspecified: Secondary | ICD-10-CM

## 2016-07-06 MED ORDER — LEVOFLOXACIN 500 MG PO TABS: 500.0000 mg | ORAL_TABLET | Freq: Every day | ORAL | 0 refills | Status: AC

## 2016-07-06 MED ORDER — GLYCOPYRROLATE-FORMOTEROL 9-4.8 MCG/ACT IN AERO: 2.0000 | INHALATION_SPRAY | Freq: Two times a day (BID) | RESPIRATORY_TRACT | 3 refills | Status: DC

## 2016-07-06 NOTE — Progress Notes (Signed)
Subjective:  Patient ID: Glen [name], male    DOB: 31-Dec-1965  Age: 51 y.o. MRN: [id number]  CC: No chief complaint on file.   HPI LEEVON MADAY presents for URI w/SOB C/o LBP  Outpatient Medications Prior to Visit  Medication Sig Dispense Refill  . Cholecalciferol (VITAMIN D3) 2000 units capsule Take 1 capsule (2,000 Units total) by mouth daily. 100 capsule 3  . citalopram (CELEXA) 10 MG tablet TAKE ONE TABLET BY MOUTH ONCE DAILY 90 tablet 2  . diclofenac sodium (VOLTAREN) 1 % GEL Apply 2 g topically 4 (four) times daily. 200 g 3  . ergocalciferol (VITAMIN D2) 50000 units capsule Take 1 capsule (50,000 Units total) by mouth once a week. 4 capsule 0  . omeprazole (PRILOSEC) 40 MG capsule Take 1 capsule (40 mg total) by mouth daily. 30 capsule 3  . traMADol (ULTRAM) 50 MG tablet Take 1 tablet (50 mg total) by mouth every 8 (eight) hours as needed. 60 tablet 0  . triamcinolone (KENALOG) 0.1 % paste Place onto teeth 2 (two) times daily. 5 g 1  . valACYclovir (VALTREX) 500 MG tablet TAKE ONE CAPLET BY MOUTH TWICE DAILY AS NEEDED FOR  FEVER  BLISTERS 10 tablet 2  . zolmitriptan (ZOMIG) 5 MG tablet Take 1 tablet (5 mg total) by mouth as needed for migraine. 10 tablet 3  . ZOMIG 5 MG nasal solution USE ONE DOSE IN NOSTRIL AS NEEDED FOR MIGRAINE 6 Units 3  . ALPRAZolam (XANAX) 0.25 MG tablet Take 0.25 mg by mouth 2 (two) times daily as needed.    . Linaclotide (LINZESS) 145 MCG CAPS capsule Take 1 capsule (145 mcg total) by mouth 1 day or 1 dose. (Patient not taking: Reported on 07/06/2016) 90 capsule 3  . propranolol (INDERAL) 20 MG tablet Take 1-2 tablets (20-40 mg total) by mouth 2 (two) times daily as needed (palpitations). (Patient not taking: Reported on 07/06/2016) 60 tablet 3   No facility-administered medications prior to visit.     ROS Review of Systems  Constitutional: Negative for appetite change, fatigue and unexpected weight change.  HENT: Positive for sinus pressure.  Negative for congestion, nosebleeds, sneezing, sore throat and trouble swallowing.   Eyes: Negative for itching and visual disturbance.  Respiratory: Positive for chest tightness and shortness of breath. Negative for cough and wheezing.   Cardiovascular: Negative for chest pain, palpitations and leg swelling.  Gastrointestinal: Negative for abdominal distention, blood in stool, diarrhea and nausea.  Genitourinary: Negative for frequency and hematuria.  Musculoskeletal: Negative for back pain, gait problem, joint swelling and neck pain.  Skin: Negative for rash.  Neurological: Negative for dizziness, tremors, speech difficulty and weakness.  Psychiatric/Behavioral: Negative for agitation, dysphoric mood, sleep disturbance and suicidal ideas. The patient is not nervous/anxious.     Objective:  BP 132/72   Pulse 69   Temp 97.9 F (36.6 C) (Oral)   Resp 16   Ht 6\' 2"  (1.88 m)   Wt 195 lb 1.9 oz (88.5 kg)   SpO2 98%   BMI 25.05 kg/m   BP Readings from Last 3 Encounters:  07/06/16 132/72  07/11/15 120/76  06/28/14 128/72    Wt Readings from Last 3 Encounters:  07/06/16 195 lb 1.9 oz (88.5 kg)  07/11/15 193 lb (87.5 kg)  06/28/14 205 lb (93 kg)    Physical Exam  Constitutional: He is oriented to person, place, and time. He appears well-developed. No distress.  NAD  HENT:  Mouth/Throat: No oropharyngeal  exudate.  Eyes: Conjunctivae are normal. Pupils are equal, round, and reactive to light.  Neck: Normal range of motion. No JVD present. No thyromegaly present.  Cardiovascular: Normal rate, regular rhythm, normal heart sounds and intact distal pulses.  Exam reveals no gallop and no friction rub.   No murmur heard. Pulmonary/Chest: Effort normal. No respiratory distress. He has no wheezes. He has rales. He exhibits no tenderness.  B rhonchi  Abdominal: Soft. Bowel sounds are normal. He exhibits no distension and no mass. There is no tenderness. There is no rebound and no  guarding.  Musculoskeletal: Normal range of motion. He exhibits no edema or tenderness.  Lymphadenopathy:    He has no cervical adenopathy.  Neurological: He is alert and oriented to person, place, and time. He has normal reflexes. No cranial nerve deficit. He exhibits normal muscle tone. He displays a negative Romberg sign. Coordination and gait normal.  Skin: Skin is warm and dry. No rash noted.  Psychiatric: He has a normal mood and affect. His behavior is normal. Judgment and thought content normal.  hoarse, eryth throat  Lab Results  Component Value Date   WBC 4.5 07/12/2015   HGB 15.9 07/12/2015   HCT 46.8 07/12/2015   PLT 245.0 07/12/2015   GLUCOSE 91 07/12/2015   CHOL 218 (H) 07/12/2015   TRIG 49.0 07/12/2015   HDL 72.60 07/12/2015   LDLDIRECT 150.6 06/29/2013   LDLCALC 135 (H) 07/12/2015   ALT 20 07/12/2015   AST 18 07/12/2015   NA 141 07/12/2015   K 4.6 07/12/2015   CL 103 07/12/2015   CREATININE 0.92 07/12/2015   BUN 17 07/12/2015   CO2 30 07/12/2015   TSH 1.47 07/12/2015   PSA 1.05 07/12/2015   HGBA1C 5.3 12/02/2009    No results found.  Assessment & Plan:   There are no diagnoses linked to this encounter. I am having Mr. Letts maintain his ALPRAZolam, propranolol, triamcinolone, diclofenac sodium, zolmitriptan, Vitamin D3, omeprazole, linaclotide, ergocalciferol, valACYclovir, citalopram, ZOMIG, and traMADol.  No orders of the defined types were placed in this encounter.    Follow-up: No Follow-up on file.  Walker Kehr, MD

## 2016-07-06 NOTE — Assessment & Plan Note (Addendum)
B MSK McKenzie pillow Rice bag

## 2016-07-06 NOTE — Patient Instructions (Signed)
Use over-the-counter  "cold" medicines  such as"Afrin" nasal spray for nasal congestion as directed instead. Use " Delsym" or" Robitussin" cough syrup varietis for cough.  You can use plain "Tylenol" or "Advil" for fever, chills and achyness. Use Halls or Ricola cough drops.  Please, make an appointment if you are not better or if you're worse.  

## 2016-07-06 NOTE — Assessment & Plan Note (Signed)
Vs CAP Levaquin Bevespi bid CXR

## 2016-07-06 NOTE — Progress Notes (Signed)
Pre visit review using our clinic review tool, if applicable. No additional management support is needed unless otherwise documented below in the visit note. 

## 2016-07-08 ENCOUNTER — Encounter: Payer: Self-pay | Admitting: Internal Medicine

## 2016-07-13 DIAGNOSIS — M19042 Primary osteoarthritis, left hand: Secondary | ICD-10-CM

## 2016-07-13 DIAGNOSIS — M19072 Primary osteoarthritis, left ankle and foot: Secondary | ICD-10-CM

## 2016-07-13 DIAGNOSIS — Z8619 Personal history of other infectious and parasitic diseases: Secondary | ICD-10-CM | POA: Insufficient documentation

## 2016-07-13 DIAGNOSIS — M1990 Unspecified osteoarthritis, unspecified site: Secondary | ICD-10-CM | POA: Insufficient documentation

## 2016-07-13 DIAGNOSIS — M19041 Primary osteoarthritis, right hand: Secondary | ICD-10-CM | POA: Insufficient documentation

## 2016-07-13 DIAGNOSIS — M7062 Trochanteric bursitis, left hip: Secondary | ICD-10-CM

## 2016-07-13 DIAGNOSIS — M7061 Trochanteric bursitis, right hip: Secondary | ICD-10-CM | POA: Insufficient documentation

## 2016-07-13 DIAGNOSIS — M1380 Other specified arthritis, unspecified site: Secondary | ICD-10-CM | POA: Insufficient documentation

## 2016-07-13 DIAGNOSIS — M47816 Spondylosis without myelopathy or radiculopathy, lumbar region: Secondary | ICD-10-CM | POA: Insufficient documentation

## 2016-07-13 DIAGNOSIS — M19071 Primary osteoarthritis, right ankle and foot: Secondary | ICD-10-CM | POA: Insufficient documentation

## 2016-07-13 NOTE — Progress Notes (Deleted)
Office Visit Note  Patient: Glen [name]             Date of Birth: 03/07/1966           MRN: [id number]             PCP: Walker Kehr, MD Referring: Cassandria Anger, MD Visit Date: 07/16/2016 Occupation: @GUAROCC @    Subjective:  No chief complaint on file.   History of Present Illness: Glen [name] is a 51 y.o. male ***   Activities of Daily Living:  Patient reports morning stiffness for *** {minute/hour:19697}.   Patient {ACTIONS;DENIES/REPORTS:21021675::"Denies"} nocturnal pain.  Difficulty dressing/grooming: {ACTIONS;DENIES/REPORTS:21021675::"Denies"} Difficulty climbing stairs: {ACTIONS;DENIES/REPORTS:21021675::"Denies"} Difficulty getting out of chair: {ACTIONS;DENIES/REPORTS:21021675::"Denies"} Difficulty using hands for taps, buttons, cutlery, and/or writing: {ACTIONS;DENIES/REPORTS:21021675::"Denies"}   No Rheumatology ROS completed.   PMFS History:  Patient Active Problem List   Diagnosis Date Noted  . Inflammatory arthritis 07/13/2016  . Primary osteoarthritis of both hands 07/13/2016  . Primary osteoarthritis of both feet 07/13/2016  . Spondylosis of lumbar region without myelopathy or radiculopathy 07/13/2016  . Trochanteric bursitis of both hips 07/13/2016  . Acute bronchitis 07/06/2016  . Constipation 07/12/2015  . Vitamin D deficiency 07/12/2015  . Pain of both heels 11/03/2013  . Arthritis pain, hand 11/17/2012  . Mouth sores 11/17/2012  . Elevated BP 07/07/2012  . Palpitations 04/07/2012  . Patellar tendonitis, right 11/07/2011  . Well adult exam 03/05/2011  . ABDOMINAL PAIN, GENERALIZED 06/13/2010  . HEMORRHOIDS 05/22/2010  . RECTAL PAIN 05/22/2010  . HEMATOCHEZIA 05/22/2010  . HYPOGLYCEMIA 12/01/2009  . HYPOGONADISM 12/01/2009  . LOW BACK PAIN, ACUTE 12/01/2009  . HERPES SIMPLEX WITHOUT MENTION OF COMPLICATION [id number]  . PANIC DISORDER 11/24/2009  . DEPRESSIVE DISORDER 11/24/2009  . GERD 09/13/2009  . PES PLANUS 03/16/2009    . REACTIVE AIRWAY DISEASE 03/09/2009  . SNORING 03/09/2009  . Anxiety state 02/24/2009  . HYPOTHYROIDISM 10/25/2008  . BACK PAIN 08/23/2008  . Pain in joint, lower leg 12/29/2007  . MRSA INFECTION 01/21/2007  . HEPATITIS B 01/21/2007  . ALLERGIC RHINITIS 01/21/2007  . ABDOMINAL WALL HERNIA 01/21/2007    Past Medical History:  Diagnosis Date  . Allergic rhinitis   . Anxiety   . Asthmatic bronchitis   . Dry eyes   . GERD (gastroesophageal reflux disease)   . Hepatitis B infection 2008   recovered  . Hypothyroidism 2010   Dr. Wende Bushy  . Internal hemorrhoids   . Urethritis 2010    Family History  Problem Relation Age of Onset  . Stroke Mother   . Lupus Mother   . Arthritis Mother   . Depression Other   . Hypertension Other   . Stroke Other   . Heart disease Paternal Uncle 70    atrial fib  . Heart disease Paternal Grandfather 33    MI  . Arthritis Maternal Grandmother    Past Surgical History:  Procedure Laterality Date  . FACIAL COSMETIC SURGERY    . LIPOSUCTION    . TEAR DUCT PROBING  2009   Social History   Social History Narrative   Regular Exercise- yes     Objective: Vital Signs: There were no vitals taken for this visit.   Physical Exam   Musculoskeletal Exam: ***  CDAI Exam: No CDAI exam completed.    Investigation: Findings:  04/17/2013 After informed consent was obtained, ultrasound examination of bilateral hands, wrist and median nerve was performed using the EULAR recommendation.  We used gray scale  and 12 MHz transducer to visualize the median nerve for needle and medication placement for injection.   04/17/2013 FINDINGS:  No synovitis was noted on the right or left dorsal or volar hand or wrist.  Right median nerve was measured at 0.12 cm squared but patient is asymptomatic.  Left median nerve is 0.07 cm squared and it is normal.     No visits with results within 2 Month(s) from this visit.  Latest known visit with results is:   Lab on 07/12/2015  Component Date Value Ref Range Status  . Testosterone 07/12/2015 527.84  300.00 - 890.00 ng/dL Final  . TSH 07/12/2015 1.47  0.35 - 4.50 uIU/mL Final  . PSA 07/12/2015 1.05  0.10 - 4.00 ng/mL Final  . Ammonia 07/12/2015 28  11 - 35 umol/L Final  . Sodium 07/12/2015 141  135 - 145 mEq/L Final  . Potassium 07/12/2015 4.6  3.5 - 5.1 mEq/L Final  . Chloride 07/12/2015 103  96 - 112 mEq/L Final  . CO2 07/12/2015 30  19 - 32 mEq/L Final  . Glucose, Bld 07/12/2015 91  70 - 99 mg/dL Final  . BUN 07/12/2015 17  6 - 23 mg/dL Final  . Creatinine, Ser 07/12/2015 0.92  0.40 - 1.50 mg/dL Final  . Calcium 07/12/2015 9.4  8.4 - 10.5 mg/dL Final  . GFR 07/12/2015 92.41  >60.00 mL/min Final  . WBC 07/12/2015 4.5  4.0 - 10.5 K/uL Final  . RBC 07/12/2015 5.11  4.22 - 5.81 Mil/uL Final  . Hemoglobin 07/12/2015 15.9  13.0 - 17.0 g/dL Final  . HCT 07/12/2015 46.8  39.0 - 52.0 % Final  . MCV 07/12/2015 91.6  78.0 - 100.0 fl Final  . MCHC 07/12/2015 34.0  30.0 - 36.0 g/dL Final  . RDW 07/12/2015 13.1  11.5 - 15.5 % Final  . Platelets 07/12/2015 245.0  150.0 - 400.0 K/uL Final  . Neutrophils Relative % 07/12/2015 54.8  43.0 - 77.0 % Final  . Lymphocytes Relative 07/12/2015 34.8  12.0 - 46.0 % Final  . Monocytes Relative 07/12/2015 7.7  3.0 - 12.0 % Final  . Eosinophils Relative 07/12/2015 2.2  0.0 - 5.0 % Final  . Basophils Relative 07/12/2015 0.5  0.0 - 3.0 % Final  . Neutro Abs 07/12/2015 2.5  1.4 - 7.7 K/uL Final  . Lymphs Abs 07/12/2015 1.6  0.7 - 4.0 K/uL Final  . Monocytes Absolute 07/12/2015 0.3  0.1 - 1.0 K/uL Final  . Eosinophils Absolute 07/12/2015 0.1  0.0 - 0.7 K/uL Final  . Basophils Absolute 07/12/2015 0.0  0.0 - 0.1 K/uL Final  . Total Bilirubin 07/12/2015 0.8  0.2 - 1.2 mg/dL Final  . Bilirubin, Direct 07/12/2015 0.1  0.0 - 0.3 mg/dL Final  . Alkaline Phosphatase 07/12/2015 41  39 - 117 U/L Final  . AST 07/12/2015 18  0 - 37 U/L Final  . ALT 07/12/2015 20  0 - 53 U/L  Final  . Total Protein 07/12/2015 6.7  6.0 - 8.3 g/dL Final  . Albumin 07/12/2015 4.3  3.5 - 5.2 g/dL Final  . Cholesterol 07/12/2015 218* 0 - 200 mg/dL Final  . Triglycerides 07/12/2015 49.0  0.0 - 149.0 mg/dL Final  . HDL 07/12/2015 72.60  >39.00 mg/dL Final  . VLDL 07/12/2015 9.8  0.0 - 40.0 mg/dL Final  . LDL Cholesterol 07/12/2015 135* 0 - 99 mg/dL Final  . Total CHOL/HDL Ratio 07/12/2015 3   Final  . NonHDL 07/12/2015 145.13   Final  .  Color, Urine 07/12/2015 YELLOW  Yellow;Lt. Yellow Final  . APPearance 07/12/2015 CLEAR  Clear Final  . Specific Gravity, Urine 07/12/2015 1.025  1.000 - 1.030 Final  . pH 07/12/2015 6.0  5.0 - 8.0 Final  . Total Protein, Urine 07/12/2015 NEGATIVE  Negative Final  . Urine Glucose 07/12/2015 NEGATIVE  Negative Final  . Ketones, ur 07/12/2015 NEGATIVE  Negative Final  . Bilirubin Urine 07/12/2015 NEGATIVE  Negative Final  . Hgb urine dipstick 07/12/2015 NEGATIVE  Negative Final  . Urobilinogen, UA 07/12/2015 0.2  0.0 - 1.0 Final  . Leukocytes, UA 07/12/2015 NEGATIVE  Negative Final  . Nitrite 07/12/2015 NEGATIVE  Negative Final  . Vitamin B-12 07/12/2015 588  211 - 911 pg/mL Final  . VITD 07/12/2015 29.55* 30.00 - 100.00 ng/mL Final    Imaging: Dg Chest 2 View  Result Date: 07/06/2016 CLINICAL DATA:  Cough and congestion. Shortness of breath. Headaches. EXAM: CHEST  2 VIEW COMPARISON:  None. FINDINGS: The heart size is normal. Lungs are clear. Hyperexpansion is noted. There is no edema or effusion. Degenerative changes are present in the lower thoracic spine. IMPRESSION: 1. No acute cardiopulmonary disease. 2. Emphysema. Electronically Signed   By: San Morelle M.D.   On: 07/06/2016 13:29    Speciality Comments: No specialty comments available.    Procedures:  No procedures performed Allergies: Codeine and Penicillins   Assessment / Plan:     Visit Diagnoses: Inflammatory arthritis  Primary osteoarthritis of both hands  Primary  osteoarthritis of both feet  Spondylosis of lumbar region without myelopathy or radiculopathy  Trochanteric bursitis of both hips    Orders: No orders of the defined types were placed in this encounter.  No orders of the defined types were placed in this encounter.   Face-to-face time spent with patient was *** minutes. 50% of time was spent in counseling and coordination of care.  Follow-Up Instructions: No Follow-up on file.   Tyarra Nolton, RT  Note - This record has been created using [employer].  Chart creation errors have been sought, but may not always  have been located. Such creation errors do not reflect on  the standard of medical care.

## 2016-07-16 ENCOUNTER — Ambulatory Visit: Payer: Self-pay | Admitting: Rheumatology

## 2016-07-23 ENCOUNTER — Ambulatory Visit: Payer: Self-pay | Admitting: Rheumatology

## 2016-08-15 ENCOUNTER — Telehealth: Payer: Self-pay | Admitting: Internal Medicine

## 2016-08-15 NOTE — Telephone Encounter (Signed)
Patient states he last seen Dr. Alain Marion and he has finished medication.  States that he feels that his symptoms may be coming back.  Patient states he has tried to clear his throat so much that his throat is sore and that he has chest congestion but nothing is coming up.

## 2016-08-16 NOTE — Telephone Encounter (Signed)
It could be another a" viral cold" illness.  Use over-the-counter  "cold" medicines  such as "Tylenol cold" , "Advil cold",  "Mucinex" or" Mucinex D"  for cough and congestion.    Use Halls or Ricola cough drops.     Please, make an appointment if  not better or if you're worse.  Thx

## 2016-08-17 NOTE — Telephone Encounter (Signed)
Pt informed of PCP advise.  Pt stated understanding and will try the OTC meds recommended.

## 2016-08-24 ENCOUNTER — Encounter: Payer: Self-pay | Admitting: Internal Medicine

## 2016-08-24 ENCOUNTER — Ambulatory Visit (INDEPENDENT_AMBULATORY_CARE_PROVIDER_SITE_OTHER): Payer: Self-pay | Admitting: Internal Medicine

## 2016-08-24 VITALS — BP 144/86 | HR 78 | Temp 98.6°F | Ht 74.0 in | Wt 200.0 lb

## 2016-08-24 DIAGNOSIS — R062 Wheezing: Secondary | ICD-10-CM

## 2016-08-24 DIAGNOSIS — R030 Elevated blood-pressure reading, without diagnosis of hypertension: Secondary | ICD-10-CM

## 2016-08-24 DIAGNOSIS — R059 Cough, unspecified: Secondary | ICD-10-CM | POA: Insufficient documentation

## 2016-08-24 DIAGNOSIS — R05 Cough: Secondary | ICD-10-CM

## 2016-08-24 MED ORDER — PREDNISONE 10 MG PO TABS: ORAL_TABLET | ORAL | 1 refills | Status: DC

## 2016-08-24 MED ORDER — AZITHROMYCIN 250 MG PO TABS: ORAL_TABLET | ORAL | 1 refills | Status: DC

## 2016-08-24 MED ORDER — BENZONATATE 100 MG PO CAPS: ORAL_CAPSULE | ORAL | 1 refills | Status: DC

## 2016-08-24 NOTE — Progress Notes (Signed)
Subjective:    Patient ID: Glen [name], male    DOB: March 25, 1966, 51 y.o.   MRN: [id number]  HPI  Here with acute onset mild to mod 2-3 days ST, HA, general weakness and malaise, with prod cough greenish sputum, but Pt denies chest pain, increased, orthopnea, PND, increased LE swelling, palpitations, dizziness or syncope, but with onset mild wheeze, sob since last PM  Pt denies new neurological symptoms such as new headache, or facial or extremity weakness or numbness   Pt denies polydipsia, polyuria, or low sugar symptoms such as weakness or confusion improved with po intake.  Pt states overall good compliance with meds, trying to follow lower cholesterol, diabetic diet, wt overall stable but little exercise however.    Past Medical History:  Diagnosis Date  . Allergic rhinitis   . Anxiety   . Asthmatic bronchitis   . Dry eyes   . GERD (gastroesophageal reflux disease)   . Hepatitis B infection 2008   recovered  . Hypothyroidism 2010   Dr. Wende Bushy  . Internal hemorrhoids   . Urethritis 2010   Past Surgical History:  Procedure Laterality Date  . FACIAL COSMETIC SURGERY    . LIPOSUCTION    . TEAR DUCT PROBING  2009    reports that he has quit smoking. He has never used smokeless tobacco. He reports that he drinks alcohol. He reports that he does not use drugs. family history includes Arthritis in his maternal grandmother and mother; Depression in his other; Heart disease (age of onset: 53) in his paternal grandfather; Heart disease (age of onset: 47) in his paternal uncle; Hypertension in his other; Lupus in his mother; Stroke in his mother and other. Allergies  Allergen Reactions  . Codeine     ? Mental status changes  . Penicillins     Mother said don't take   Current Outpatient Prescriptions on File Prior to Visit  Medication Sig Dispense Refill  . Cholecalciferol (VITAMIN D3) 2000 units capsule Take 1 capsule (2,000 Units total) by mouth daily. 100 capsule 3  .  citalopram (CELEXA) 10 MG tablet TAKE ONE TABLET BY MOUTH ONCE DAILY 90 tablet 2  . diclofenac sodium (VOLTAREN) 1 % GEL Apply 2 g topically 4 (four) times daily. 200 g 3  . ergocalciferol (VITAMIN D2) 50000 units capsule Take 1 capsule (50,000 Units total) by mouth once a week. 4 capsule 0  . Glycopyrrolate-Formoterol (BEVESPI AEROSPHERE) 9-4.8 MCG/ACT AERO Inhale 2 Inhalers into the lungs 2 (two) times daily. 1 Inhaler 3  . Linaclotide (LINZESS) 145 MCG CAPS capsule Take 1 capsule (145 mcg total) by mouth 1 day or 1 dose. 90 capsule 3  . omeprazole (PRILOSEC) 40 MG capsule Take 1 capsule (40 mg total) by mouth daily. 30 capsule 3  . propranolol (INDERAL) 20 MG tablet Take 1-2 tablets (20-40 mg total) by mouth 2 (two) times daily as needed (palpitations). 60 tablet 3  . traMADol (ULTRAM) 50 MG tablet Take 1 tablet (50 mg total) by mouth every 8 (eight) hours as needed. 60 tablet 0  . triamcinolone (KENALOG) 0.1 % paste Place onto teeth 2 (two) times daily. 5 g 1  . valACYclovir (VALTREX) 500 MG tablet TAKE ONE CAPLET BY MOUTH TWICE DAILY AS NEEDED FOR  FEVER  BLISTERS 10 tablet 2  . zolmitriptan (ZOMIG) 5 MG tablet Take 1 tablet (5 mg total) by mouth as needed for migraine. 10 tablet 3  . ZOMIG 5 MG nasal solution USE ONE DOSE  IN NOSTRIL AS NEEDED FOR MIGRAINE 6 Units 3  . ALPRAZolam (XANAX) 0.25 MG tablet Take 0.25 mg by mouth 2 (two) times daily as needed.     No current facility-administered medications on file prior to visit.    Review of Systems All otherwise neg per pt     Objective:   Physical Exam BP (!) 144/86   Pulse 78   Temp 98.6 F (37 C)   Ht 6\' 2"  (1.88 m)   Wt 200 lb (90.7 kg)   SpO2 99%   BMI 25.68 kg/m  VS noted, mild ill Constitutional: Pt appears in no apparent distress HENT: Head: NCAT.  Right Ear: External ear normal.  Left Ear: External ear normal.  Eyes: . Pupils are equal, round, and reactive to light. Conjunctivae and EOM are normal Bilat tm's with mild  erythema.  Max sinus areas mild tender.  Pharynx with mild erythema, no exudate Neck: Normal range of motion. Neck supple.  Cardiovascular: Normal rate and regular rhythm.   Pulmonary/Chest: Effort normal and breath sounds decreased without rales but with few scattered wheezing.  Neurological: Pt is alert. Not confused , motor grossly intact Skin: Skin is warm. No rash, no LE edema Psychiatric: Pt behavior is normal. No agitation.  No other exam findings    Assessment & Plan:

## 2016-08-24 NOTE — Assessment & Plan Note (Signed)
Mild to mod, c/w bornchitis vs pna, declines cxr, for antibx course, cough med prn,  to f/u any worsening symptoms or concerns °

## 2016-08-24 NOTE — Assessment & Plan Note (Signed)
Mild, for predpac asd,  to f/u any worsening symptoms or concerns 

## 2016-08-24 NOTE — Patient Instructions (Signed)
Please take all new medication as prescribed - the antibiotic, cough pills if needed, and prednisone  Please continue all other medications as before, and refills have been done if requested.  Please have the pharmacy call with any other refills you may need.  Please keep your appointments with your specialists as you may have planned

## 2016-08-24 NOTE — Assessment & Plan Note (Signed)
Mild elevated, likely reactive, cont to follow with BP at home and next visit

## 2016-08-28 DIAGNOSIS — M5136 Other intervertebral disc degeneration, lumbar region: Secondary | ICD-10-CM | POA: Insufficient documentation

## 2016-08-28 DIAGNOSIS — M51369 Other intervertebral disc degeneration, lumbar region without mention of lumbar back pain or lower extremity pain: Secondary | ICD-10-CM | POA: Insufficient documentation

## 2016-08-31 ENCOUNTER — Ambulatory Visit: Payer: Self-pay | Admitting: Internal Medicine

## 2016-09-02 ENCOUNTER — Encounter: Payer: Self-pay | Admitting: Internal Medicine

## 2016-09-04 ENCOUNTER — Telehealth: Payer: Self-pay | Admitting: Internal Medicine

## 2016-09-04 NOTE — Telephone Encounter (Signed)
If no fever or worsening sob and/or cough, I would try Mucinex BID prn, as the congestive symtpoms can last even after the infection is improved

## 2016-09-04 NOTE — Telephone Encounter (Signed)
Pt called in last week and seen Dr Jenny Reichmann .  He said he is much better but he still has some mucus in his chest.  What to know if anything else can be called in to help finish this off   Best number [phone]

## 2016-09-04 NOTE — Telephone Encounter (Signed)
Routing to dr john, please advise, thanks 

## 2016-09-05 NOTE — Telephone Encounter (Signed)
Advised patient of dr Gwynn Burly note/instructions---he will try that

## 2016-09-12 ENCOUNTER — Other Ambulatory Visit: Payer: Self-pay | Admitting: Internal Medicine

## 2016-10-04 ENCOUNTER — Ambulatory Visit (INDEPENDENT_AMBULATORY_CARE_PROVIDER_SITE_OTHER): Payer: Self-pay | Admitting: Pulmonary Disease

## 2016-10-04 ENCOUNTER — Encounter: Payer: Self-pay | Admitting: Pulmonary Disease

## 2016-10-04 VITALS — BP 140/96 | HR 77 | Ht 74.0 in | Wt 202.4 lb

## 2016-10-04 DIAGNOSIS — R0602 Shortness of breath: Secondary | ICD-10-CM

## 2016-10-04 LAB — NITRIC OXIDE: Nitric Oxide: 26

## 2016-10-04 MED ORDER — BUDESONIDE-FORMOTEROL FUMARATE 160-4.5 MCG/ACT IN AERO: 2.0000 | INHALATION_SPRAY | Freq: Two times a day (BID) | RESPIRATORY_TRACT | 0 refills | Status: DC

## 2016-10-04 MED ORDER — CHLORPHENIRAMINE MALEATE 4 MG PO TABS: 8.0000 mg | ORAL_TABLET | Freq: Three times a day (TID) | ORAL | 2 refills | Status: DC

## 2016-10-04 MED ORDER — FLUTICASONE PROPIONATE 50 MCG/ACT NA SUSP: 1.0000 | Freq: Two times a day (BID) | NASAL | 2 refills | Status: DC

## 2016-10-04 NOTE — Progress Notes (Signed)
JOANNE BRANDER    [id number]    [birth year]  Primary Care Physician:Alex Plotnikov, MD  Referring Physician: Cassandria Anger, MD [address] [place], [place] [address]  Chief complaint:  Consult for evaluation of dyspnea  HPI: Mr. Corp is a 51 year old with no significant past medical history. He had an episode of bronchitis in January which was treated with Levaquin, inhaler and humidifier. He also took Mucinex and Delsym. His symptoms improved temporarily but recurred in February when he was treated with Z-Pak and prednisone. He was also given a bevespi inhaler.    He feels that the symptoms have improved but he has lingering symptoms of chest congestion, heaviness in the chest, occasional nonproductive cough, no dyspnea, wheezing. He has tried Mucinex for the past few weeks.  Pets: 4 dogs, does not report any sensitivity to cats. No exotic pets, birds Occupation: Works as a Theme park manager Exposures: No known exposures, no more, heart Smoking history: One pack per day for 15 years. Quit 16 years ago.  Outpatient Encounter Prescriptions as of 10/04/2016  Medication Sig  . benzonatate (TESSALON PERLES) 100 MG capsule 1-2 tabs by mouth every 6 hrs as needed for cough  . Cholecalciferol (VITAMIN D3) 2000 units capsule Take 1 capsule (2,000 Units total) by mouth daily.  . citalopram (CELEXA) 10 MG tablet Take 1 tablet (10 mg total) by mouth daily. Overdue for yearly physical w/labs must see MD for refills  . ergocalciferol (VITAMIN D2) 50000 units capsule Take 1 capsule (50,000 Units total) by mouth once a week.  Marland Kitchen omeprazole (PRILOSEC) 40 MG capsule Take 1 capsule (40 mg total) by mouth daily.  . valACYclovir (VALTREX) 500 MG tablet TAKE ONE CAPLET BY MOUTH TWICE DAILY AS NEEDED FOR  FEVER  BLISTERS  . ZOMIG 5 MG nasal solution USE ONE DOSE IN NOSTRIL AS NEEDED FOR MIGRAINE  . ALPRAZolam (XANAX) 0.25 MG tablet Take 0.25 mg by mouth 2 (two) times daily as needed.  Marland Kitchen azithromycin  (ZITHROMAX Z-PAK) 250 MG tablet 2 tab by mouth day 1, then 1 per day (Patient not taking: Reported on 10/04/2016)  . diclofenac sodium (VOLTAREN) 1 % GEL Apply 2 g topically 4 (four) times daily. (Patient not taking: Reported on 10/04/2016)  . Glycopyrrolate-Formoterol (BEVESPI AEROSPHERE) 9-4.8 MCG/ACT AERO Inhale 2 Inhalers into the lungs 2 (two) times daily. (Patient not taking: Reported on 10/04/2016)  . Linaclotide (LINZESS) 145 MCG CAPS capsule Take 1 capsule (145 mcg total) by mouth 1 day or 1 dose. (Patient not taking: Reported on 10/04/2016)  . predniSONE (DELTASONE) 10 MG tablet 2 tabs by mouth per day for 5 days (Patient not taking: Reported on 10/04/2016)  . propranolol (INDERAL) 20 MG tablet Take 1-2 tablets (20-40 mg total) by mouth 2 (two) times daily as needed (palpitations). (Patient not taking: Reported on 10/04/2016)  . traMADol (ULTRAM) 50 MG tablet Take 1 tablet (50 mg total) by mouth every 8 (eight) hours as needed. (Patient not taking: Reported on 10/04/2016)  . triamcinolone (KENALOG) 0.1 % paste Place onto teeth 2 (two) times daily. (Patient not taking: Reported on 10/04/2016)  . zolmitriptan (ZOMIG) 5 MG tablet Take 1 tablet (5 mg total) by mouth as needed for migraine. (Patient not taking: Reported on 10/04/2016)   No facility-administered encounter medications on file as of 10/04/2016.     Allergies as of 10/04/2016 - Review Complete 10/04/2016  Allergen Reaction Noted  . Codeine    . Penicillins  Past Medical History:  Diagnosis Date  . Allergic rhinitis   . Anxiety   . Asthmatic bronchitis   . Dry eyes   . GERD (gastroesophageal reflux disease)   . Hepatitis B infection 2008   recovered  . Hypothyroidism 2010   Dr. Wende Bushy  . Internal hemorrhoids   . Urethritis 2010    Past Surgical History:  Procedure Laterality Date  . FACIAL COSMETIC SURGERY    . LIPOSUCTION    . TEAR DUCT PROBING  2009    Family History  Problem Relation Age of Onset  .  Stroke Mother   . Lupus Mother   . Arthritis Mother   . Depression Other   . Hypertension Other   . Stroke Other   . Heart disease Paternal Uncle 70    atrial fib  . Heart disease Paternal Grandfather 51    MI  . Arthritis Maternal Grandmother     Social History   Social History  . Marital status: Single    Spouse name: N/A  . Number of children: N/A  . Years of education: N/A   Occupational History  . Not on file.   Social History Main Topics  . Smoking status: Former Smoker    Packs/day: 1.50    Types: Cigarettes    Quit date: 06/19/1999  . Smokeless tobacco: Never Used  . Alcohol use Yes  . Drug use: No  . Sexual activity: Yes   Other Topics Concern  . Not on file   Social History Narrative   Regular Exercise- yes    Review of systems: Review of Systems  Constitutional: Negative for fever and chills.  HENT: Negative.   Eyes: Negative for blurred vision.  Respiratory: as per HPI  Cardiovascular: Negative for chest pain and palpitations.  Gastrointestinal: Negative for vomiting, diarrhea, blood per rectum. Genitourinary: Negative for dysuria, urgency, frequency and hematuria.  Musculoskeletal: Negative for myalgias, back pain and joint pain.  Skin: Negative for itching and rash.  Neurological: Negative for dizziness, tremors, focal weakness, seizures and loss of consciousness.  Endo/Heme/Allergies: Negative for environmental allergies.  Psychiatric/Behavioral: Negative for depression, suicidal ideas and hallucinations.  All other systems reviewed and are negative.  Physical Exam: Blood pressure (!) 140/96, pulse 77, height 6\' 2"  (1.88 m), weight 202 lb 6.4 oz (91.8 kg), SpO2 96 %. Gen:      No acute distress HEENT:  EOMI, sclera anicteric Neck:     No masses; no thyromegaly Lungs:    Clear to auscultation bilaterally; normal respiratory effort CV:         Regular rate and rhythm; no murmurs Abd:      + bowel sounds; soft, non-tender; no palpable masses,  no distension Ext:    No edema; adequate peripheral perfusion Skin:      Warm and dry; no rash Neuro: alert and oriented x 3 Psych: normal mood and affect  Data Reviewed: Chest x-ray 07/06/16-mild hyperinflated lungs, no infiltrate or edema.  FENO 10/04/16- 26  Assessment:  Consult for evaluation of cough, congestion Likely has lingering symptoms, postinflammatory in nature after recent episode of bronchitis. He does not appear infected or wheezing and I don't believe he'll require another course of antibiotic or prednisone.  FENO is slightly elevated indicating airway inflammation. I have advised him to continue over-the-counter Mucinex. He may benefit from over-the-counter Flonase and antihistamine such as chlorphentermine to reduce the congestion, postnasal drip. We discussed getting pulmonary function tests for further evaluation of COPD given  his smoking history but since he is a self pay we will hold off for now.   Plan/Recommendations: - Chlorpheniramine and flonase - Samples of symbicort  Marshell Garfinkel MD [facility] Pager [phone] 10/04/2016, 4:20 PM  CC: Plotnikov, Evie Lacks, MD

## 2016-10-04 NOTE — Patient Instructions (Addendum)
  Use chlorpheniramine 8 mg tid and flonase nasal spray bid Continue the mucinex Will give sample of Symbicort to use to reduce the airway inflammation.  Call us if there is worsening of your symptoms to be seen in clinic again

## 2016-10-04 NOTE — Addendum Note (Signed)
Addended by: Maryanna Shape A on: 10/04/2016 05:01 PM   Modules accepted: Orders

## 2016-10-08 ENCOUNTER — Other Ambulatory Visit: Payer: Self-pay | Admitting: *Deleted

## 2016-10-08 MED ORDER — CITALOPRAM HYDROBROMIDE 10 MG PO TABS: 10.0000 mg | ORAL_TABLET | Freq: Every day | ORAL | 0 refills | Status: DC

## 2016-10-11 ENCOUNTER — Institutional Professional Consult (permissible substitution): Payer: Self-pay | Admitting: Pulmonary Disease

## 2016-10-12 ENCOUNTER — Institutional Professional Consult (permissible substitution): Payer: Self-pay | Admitting: Internal Medicine

## 2016-10-16 ENCOUNTER — Encounter: Payer: Self-pay | Admitting: Pulmonary Disease

## 2016-10-29 ENCOUNTER — Telehealth: Payer: Self-pay | Admitting: Pulmonary Disease

## 2016-10-29 MED ORDER — BUDESONIDE-FORMOTEROL FUMARATE 160-4.5 MCG/ACT IN AERO
2.0000 | INHALATION_SPRAY | Freq: Two times a day (BID) | RESPIRATORY_TRACT | 0 refills | Status: DC
Start: 2016-10-29 — End: 2020-05-18

## 2016-10-29 NOTE — Telephone Encounter (Signed)
symbicort samples left up front for pt.  Pt aware.  Nothing further needed.

## 2016-11-15 ENCOUNTER — Other Ambulatory Visit: Payer: Self-pay | Admitting: Internal Medicine

## 2016-11-26 ENCOUNTER — Telehealth: Payer: Self-pay | Admitting: Pulmonary Disease

## 2016-11-26 NOTE — Telephone Encounter (Signed)
Spoke with pt. States that he is having some issues with his breathing again. Pt could not give me specifics about what kind of symptoms he was having. An appointment has been made for the pt already on 11/28/16. While speaking to the pt, he was NOT in distress. Advised the pt that if his symptoms get worse before his appointment, call our office. Nothing further was needed.

## 2016-11-28 ENCOUNTER — Encounter: Payer: Self-pay | Admitting: Pulmonary Disease

## 2016-11-28 ENCOUNTER — Ambulatory Visit (INDEPENDENT_AMBULATORY_CARE_PROVIDER_SITE_OTHER): Payer: Self-pay | Admitting: Pulmonary Disease

## 2016-11-28 VITALS — BP 124/70 | HR 85 | Ht 74.0 in | Wt 201.8 lb

## 2016-11-28 DIAGNOSIS — R059 Cough, unspecified: Secondary | ICD-10-CM

## 2016-11-28 DIAGNOSIS — R05 Cough: Secondary | ICD-10-CM

## 2016-11-28 LAB — NITRIC OXIDE: NITRIC OXIDE: 16

## 2016-11-28 MED ORDER — BUDESONIDE-FORMOTEROL FUMARATE 160-4.5 MCG/ACT IN AERO
2.0000 | INHALATION_SPRAY | Freq: Two times a day (BID) | RESPIRATORY_TRACT | 0 refills | Status: DC
Start: 2016-11-28 — End: 2017-02-06

## 2016-11-28 NOTE — Progress Notes (Signed)
Glen [name]    [id number]    [birth year]  Primary Care Physician:Plotnikov, Evie Lacks, MD  Referring Physician: Cassandria Anger, MD [place], [place] [address]  Chief complaint:  Consult for evaluation of dyspnea  HPI: Glen [name] is a 51 year old with no significant past medical history. He had an episode of bronchitis in January which was treated with Levaquin, inhaler and humidifier. He also took Mucinex and Delsym. His symptoms improved temporarily but recurred in February when he was treated with Z-Pak and prednisone. He was also given a bevespi inhaler.    He feels that the symptoms have improved but he has lingering symptoms of chest congestion, heaviness in the chest, occasional nonproductive cough, no dyspnea, wheezing. He has tried Mucinex for the past few weeks.  Pets: 4 dogs, does not report any sensitivity to cats. No exotic pets, birds Occupation: Works as a Theme park manager Exposures: No known exposures, no more, heart Smoking history: One pack per day for 15 years. Quit 16 years ago.  Interim History: He was given samples of Symbicort at last visit. This improved his symptoms but recurred after stopping the inhaler. He has recurrent symptoms of dyspnea, inability to take a full breath. Denies any cough, wheezing, chest tightness, fevers, chills.  Outpatient Encounter Prescriptions as of 11/28/2016  Medication Sig  . budesonide-formoterol (SYMBICORT) 160-4.5 MCG/ACT inhaler Inhale 2 puffs into the lungs 2 (two) times daily.  . Cholecalciferol (VITAMIN D3) 2000 units capsule Take 1 capsule (2,000 Units total) by mouth daily.  . citalopram (CELEXA) 10 MG tablet Take 1 tablet (10 mg total) by mouth daily. Overdue for yearly physical w/labs must see MD for refills  . fluticasone (FLONASE) 50 MCG/ACT nasal spray Place 1 spray into both nostrils 2 (two) times daily.  Marland Kitchen omeprazole (PRILOSEC) 40 MG capsule Take 1 capsule (40 mg total) by mouth daily.  .  valACYclovir (VALTREX) 500 MG tablet TAKE ONE CAPLET BY MOUTH TWICE DAILY AS NEEDED FOR  FEVER  BLISTERS  . zolmitriptan (ZOMIG) 5 MG tablet Take 1 tablet (5 mg total) by mouth as needed for migraine.  Marland Kitchen ZOMIG 5 MG nasal solution USE ONE DOSE IN NOSTRIL AS NEEDED FOR MIGRAINE  . [DISCONTINUED] budesonide-formoterol (SYMBICORT) 160-4.5 MCG/ACT inhaler Inhale 2 puffs into the lungs 2 (two) times daily.  Marland Kitchen ALPRAZolam (XANAX) 0.25 MG tablet Take 0.25 mg by mouth 2 (two) times daily as needed.  . benzonatate (TESSALON PERLES) 100 MG capsule 1-2 tabs by mouth every 6 hrs as needed for cough (Patient not taking: Reported on 11/28/2016)  . chlorpheniramine (CHLOR-TRIMETON) 4 MG tablet Take 2 tablets (8 mg total) by mouth 3 (three) times daily. (Patient not taking: Reported on 11/28/2016)  . ergocalciferol (VITAMIN D2) 50000 units capsule Take 1 capsule (50,000 Units total) by mouth once a week. (Patient not taking: Reported on 11/28/2016)  . Glycopyrrolate-Formoterol (BEVESPI AEROSPHERE) 9-4.8 MCG/ACT AERO Inhale 2 Inhalers into the lungs 2 (two) times daily. (Patient not taking: Reported on 10/04/2016)  . Linaclotide (LINZESS) 145 MCG CAPS capsule Take 1 capsule (145 mcg total) by mouth 1 day or 1 dose. (Patient not taking: Reported on 10/04/2016)  . propranolol (INDERAL) 20 MG tablet Take 1-2 tablets (20-40 mg total) by mouth 2 (two) times daily as needed (palpitations). (Patient not taking: Reported on 10/04/2016)  . [DISCONTINUED] azithromycin (ZITHROMAX Z-PAK) 250 MG tablet 2 tab by mouth day 1, then 1 per day (Patient not taking: Reported on  10/04/2016)  . [DISCONTINUED] diclofenac sodium (VOLTAREN) 1 % GEL Apply 2 g topically 4 (four) times daily. (Patient not taking: Reported on 10/04/2016)  . [DISCONTINUED] predniSONE (DELTASONE) 10 MG tablet 2 tabs by mouth per day for 5 days (Patient not taking: Reported on 10/04/2016)  . [DISCONTINUED] traMADol (ULTRAM) 50 MG tablet Take 1 tablet (50 mg total) by mouth  every 8 (eight) hours as needed. (Patient not taking: Reported on 10/04/2016)  . [DISCONTINUED] triamcinolone (KENALOG) 0.1 % paste Place onto teeth 2 (two) times daily. (Patient not taking: Reported on 10/04/2016)   No facility-administered encounter medications on file as of 11/28/2016.     Allergies as of 11/28/2016 - Review Complete 11/28/2016  Allergen Reaction Noted  . Codeine    . Penicillins      Past Medical History:  Diagnosis Date  . Allergic rhinitis   . Anxiety   . Asthmatic bronchitis   . Dry eyes   . GERD (gastroesophageal reflux disease)   . Hepatitis B infection 2008   recovered  . Hypothyroidism 2010   Dr. Wende Bushy  . Internal hemorrhoids   . Urethritis 2010    Past Surgical History:  Procedure Laterality Date  . FACIAL COSMETIC SURGERY    . LIPOSUCTION    . TEAR DUCT PROBING  2009    Family History  Problem Relation Age of Onset  . Stroke Mother   . Lupus Mother   . Arthritis Mother   . Depression Other   . Hypertension Other   . Stroke Other   . Heart disease Paternal Uncle 70       atrial fib  . Heart disease Paternal Grandfather 78       MI  . Arthritis Maternal Grandmother     Social History   Social History  . Marital status: Married    Spouse name: N/A  . Number of children: N/A  . Years of education: N/A   Occupational History  . Not on file.   Social History Main Topics  . Smoking status: Former Smoker    Packs/day: 1.50    Types: Cigarettes    Quit date: 06/19/1999  . Smokeless tobacco: Never Used  . Alcohol use Yes  . Drug use: No  . Sexual activity: Yes   Other Topics Concern  . Not on file   Social History Narrative   Regular Exercise- yes    Review of systems: Review of Systems  Constitutional: Negative for fever and chills.  HENT: Negative.   Eyes: Negative for blurred vision.  Respiratory: as per HPI  Cardiovascular: Negative for chest pain and palpitations.  Gastrointestinal: Negative for vomiting,  diarrhea, blood per rectum. Genitourinary: Negative for dysuria, urgency, frequency and hematuria.  Musculoskeletal: Negative for myalgias, back pain and joint pain.  Skin: Negative for itching and rash.  Neurological: Negative for dizziness, tremors, focal weakness, seizures and loss of consciousness.  Endo/Heme/Allergies: Negative for environmental allergies.  Psychiatric/Behavioral: Negative for depression, suicidal ideas and hallucinations.  All other systems reviewed and are negative.  Physical Exam: Blood pressure 124/70, pulse 85, height 6\' 2"  (1.88 m), weight 201 lb 12.8 oz (91.5 kg), SpO2 98 %. Gen:      No acute distress HEENT:  EOMI, sclera anicteric Neck:     No masses; no thyromegaly Lungs:    Clear to auscultation bilaterally; normal respiratory effort CV:         Regular rate and rhythm; no murmurs Abd:      +  bowel sounds; soft, non-tender; no palpable masses, no distension Ext:    No edema; adequate peripheral perfusion Skin:      Warm and dry; no rash Neuro: alert and oriented x 3 Psych: normal mood and affect  Data Reviewed: Chest x-ray 07/06/16-mild hyperinflated lungs, no infiltrate or edema.  FENO 10/04/16- 26 FENO 11/28/16- 16  Assessment:  Consult for evaluation of cough, congestion Recurrent bronchitis Likely has lingering symptoms, postinflammatory in nature after recent episode of bronchitis. He does not appear infected or wheezing and I don't believe he'll require another course of antibiotic or prednisone.  He improved briefly with Symbicort, chlorphentiramine and Flonase but has recurrent symptoms. Given a smoking history he'll need evaluation for COPD. We'll schedule him for PFTs and restart the Symbicort.   Plan/Recommendations: - PFTs - Resume Symbicort  Marshell Garfinkel MD [facility] Pager [phone] 11/28/2016, 2:35 PM  CC: Plotnikov, Evie Lacks, MD

## 2016-11-28 NOTE — Patient Instructions (Signed)
We will schedule you for full pulmonary function tests. Please check with the scheduler about the cost involved. If they're too much we can do a briefer spirometry We will give samples of Symbicort and send in a prescription  Return to clinic in 3 months.

## 2016-12-03 ENCOUNTER — Encounter: Payer: Self-pay | Admitting: Pulmonary Disease

## 2016-12-04 ENCOUNTER — Ambulatory Visit (INDEPENDENT_AMBULATORY_CARE_PROVIDER_SITE_OTHER): Payer: Self-pay | Admitting: Pulmonary Disease

## 2016-12-04 DIAGNOSIS — R059 Cough, unspecified: Secondary | ICD-10-CM

## 2016-12-04 DIAGNOSIS — R05 Cough: Secondary | ICD-10-CM

## 2016-12-04 LAB — PULMONARY FUNCTION TEST
DL/VA % PRED: 86 %
DL/VA: 4.14 ml/min/mmHg/L
DLCO COR % PRED: 91 %
DLCO COR: 33.16 ml/min/mmHg
DLCO UNC % PRED: 92 %
DLCO unc: 33.79 ml/min/mmHg
FEF 25-75 PRE: 4.14 L/s
FEF 25-75 Post: 5.04 L/sec
FEF2575-%CHANGE-POST: 21 %
FEF2575-%PRED-POST: 137 %
FEF2575-%Pred-Pre: 112 %
FEV1-%CHANGE-POST: 3 %
FEV1-%Pred-Post: 114 %
FEV1-%Pred-Pre: 110 %
FEV1-Post: 4.87 L
FEV1-Pre: 4.7 L
FEV1FVC-%CHANGE-POST: 4 %
FEV1FVC-%Pred-Pre: 101 %
FEV6-%Change-Post: 0 %
FEV6-%Pred-Post: 112 %
FEV6-%Pred-Pre: 112 %
FEV6-PRE: 5.98 L
FEV6-Post: 5.99 L
FEV6FVC-%Change-Post: 1 %
FEV6FVC-%PRED-PRE: 103 %
FEV6FVC-%Pred-Post: 104 %
FVC-%Change-Post: 0 %
FVC-%Pred-Post: 108 %
FVC-%Pred-Pre: 109 %
FVC-Post: 5.99 L
FVC-Pre: 6.04 L
POST FEV1/FVC RATIO: 81 %
Post FEV6/FVC ratio: 100 %
Pre FEV1/FVC ratio: 78 %
Pre FEV6/FVC Ratio: 99 %
RV % pred: 73 %
RV: 1.65 L
TLC % pred: 103 %
TLC: 7.85 L

## 2016-12-04 NOTE — Progress Notes (Signed)
PFT done today. 

## 2016-12-17 ENCOUNTER — Other Ambulatory Visit: Payer: Self-pay | Admitting: Internal Medicine

## 2017-01-16 ENCOUNTER — Other Ambulatory Visit: Payer: Self-pay | Admitting: Internal Medicine

## 2017-01-18 ENCOUNTER — Other Ambulatory Visit: Payer: Self-pay | Admitting: Internal Medicine

## 2017-01-18 ENCOUNTER — Encounter: Payer: Self-pay | Admitting: Internal Medicine

## 2017-01-22 ENCOUNTER — Telehealth: Payer: Self-pay | Admitting: Internal Medicine

## 2017-01-22 NOTE — Telephone Encounter (Signed)
Pt called to schedule a physical with Dr Alain Marion for Wednesday, Aug 22. He is self pay and asked if lab orders could be put in for him so that he can have them before his appointment. He would like everything check that was done last time (psa included). Can these be put in for him? I can call him to let him know.

## 2017-01-23 ENCOUNTER — Encounter: Payer: Self-pay | Admitting: Internal Medicine

## 2017-01-23 NOTE — Telephone Encounter (Signed)
Prescriptions were sent in

## 2017-01-23 NOTE — Telephone Encounter (Signed)
Please advise on blood work

## 2017-01-24 ENCOUNTER — Other Ambulatory Visit: Payer: Self-pay | Admitting: Internal Medicine

## 2017-01-24 DIAGNOSIS — Z0000 Encounter for general adult medical examination without abnormal findings: Secondary | ICD-10-CM

## 2017-01-24 NOTE — Telephone Encounter (Signed)
There is a patient mychart message with this already routed to Dr. Alain Marion.

## 2017-01-28 ENCOUNTER — Other Ambulatory Visit (INDEPENDENT_AMBULATORY_CARE_PROVIDER_SITE_OTHER): Payer: Self-pay

## 2017-01-28 DIAGNOSIS — Z0000 Encounter for general adult medical examination without abnormal findings: Secondary | ICD-10-CM

## 2017-01-28 LAB — LIPID PANEL
CHOLESTEROL: 183 mg/dL (ref 0–200)
HDL: 61.6 mg/dL (ref >39.00)
LDL Cholesterol: 113 mg/dL — ABNORMAL HIGH (ref 0–99)
NonHDL: 121.68
TRIGLYCERIDES: 43 mg/dL (ref 0.0–149.0)
Total CHOL/HDL Ratio: 3
VLDL: 8.6 mg/dL (ref 0.0–40.0)

## 2017-01-28 LAB — URINALYSIS
HGB URINE DIPSTICK: NEGATIVE
Ketones, ur: NEGATIVE
LEUKOCYTES UA: NEGATIVE
NITRITE: NEGATIVE
Specific Gravity, Urine: >=1.03 — AB (ref 1.000–1.030)
Total Protein, Urine: NEGATIVE
Urine Glucose: NEGATIVE
Urobilinogen, UA: 1 (ref 0.0–1.0)
pH: 6 (ref 5.0–8.0)

## 2017-01-28 LAB — BASIC METABOLIC PANEL
BUN: 16 mg/dL (ref 6–23)
CALCIUM: 9.1 mg/dL (ref 8.4–10.5)
CO2: 29 mEq/L (ref 19–32)
CREATININE: 0.95 mg/dL (ref 0.40–1.50)
Chloride: 104 mEq/L (ref 96–112)
GFR: 88.51 mL/min (ref >60.00)
Glucose, Bld: 102 mg/dL — ABNORMAL HIGH (ref 70–99)
Potassium: 4.3 mEq/L (ref 3.5–5.1)
Sodium: 139 mEq/L (ref 135–145)

## 2017-01-28 LAB — CBC WITH DIFFERENTIAL/PLATELET
BASOS ABS: 0 10*3/uL (ref 0.0–0.1)
Basophils Relative: 0.7 % (ref 0.0–3.0)
EOS ABS: 0.1 10*3/uL (ref 0.0–0.7)
Eosinophils Relative: 2.2 % (ref 0.0–5.0)
HEMATOCRIT: 43.2 % (ref 39.0–52.0)
HEMOGLOBIN: 15 g/dL (ref 13.0–17.0)
LYMPHS PCT: 40.1 % (ref 12.0–46.0)
Lymphs Abs: 1.8 10*3/uL (ref 0.7–4.0)
MCHC: 34.7 g/dL (ref 30.0–36.0)
MCV: 92.9 fl (ref 78.0–100.0)
Monocytes Absolute: 0.4 10*3/uL (ref 0.1–1.0)
Monocytes Relative: 9 % (ref 3.0–12.0)
NEUTROS ABS: 2.2 10*3/uL (ref 1.4–7.7)
Neutrophils Relative %: 48 % (ref 43.0–77.0)
PLATELETS: 222 10*3/uL (ref 150.0–400.0)
RBC: 4.65 Mil/uL (ref 4.22–5.81)
RDW: 12.7 % (ref 11.5–15.5)
WBC: 4.5 10*3/uL (ref 4.0–10.5)

## 2017-01-28 LAB — HEPATIC FUNCTION PANEL
ALK PHOS: 45 U/L (ref 39–117)
ALT: 15 U/L (ref 0–53)
AST: 20 U/L (ref 0–37)
Albumin: 4.3 g/dL (ref 3.5–5.2)
BILIRUBIN DIRECT: 0.2 mg/dL (ref 0.0–0.3)
TOTAL PROTEIN: 6.1 g/dL (ref 6.0–8.3)
Total Bilirubin: 0.8 mg/dL (ref 0.2–1.2)

## 2017-01-28 LAB — TSH: TSH: 1.4 u[IU]/mL (ref 0.35–4.50)

## 2017-01-28 LAB — PSA: PSA: 1.63 ng/mL (ref 0.10–4.00)

## 2017-02-06 ENCOUNTER — Ambulatory Visit (INDEPENDENT_AMBULATORY_CARE_PROVIDER_SITE_OTHER): Payer: Self-pay | Admitting: Internal Medicine

## 2017-02-06 ENCOUNTER — Encounter: Payer: Self-pay | Admitting: Internal Medicine

## 2017-02-06 VITALS — BP 124/72 | HR 69 | Temp 97.8°F | Ht 74.0 in | Wt 204.0 lb

## 2017-02-06 DIAGNOSIS — R059 Cough, unspecified: Secondary | ICD-10-CM

## 2017-02-06 DIAGNOSIS — Z0000 Encounter for general adult medical examination without abnormal findings: Secondary | ICD-10-CM

## 2017-02-06 DIAGNOSIS — Z23 Encounter for immunization: Secondary | ICD-10-CM

## 2017-02-06 DIAGNOSIS — R05 Cough: Secondary | ICD-10-CM

## 2017-02-06 MED ORDER — VALACYCLOVIR HCL 500 MG PO TABS: ORAL_TABLET | ORAL | 1 refills | Status: DC

## 2017-02-06 MED ORDER — CITALOPRAM HYDROBROMIDE 10 MG PO TABS: ORAL_TABLET | ORAL | 3 refills | Status: DC

## 2017-02-06 NOTE — Assessment & Plan Note (Addendum)
?  VCD  Dr Vaughan Browner PFTs - good ?VCD

## 2017-02-06 NOTE — Progress Notes (Signed)
Subjective:  Patient ID: Glen [name], male    DOB: 20-Mar-1966  Age: 51 y.o. MRN: [id number]  CC: No chief complaint on file.   HPI Glen [name] presents for a well exam C/o ST - has to clear his throat; hoarse sometimes; ?spastic sensation  Outpatient Medications Prior to Visit  Medication Sig Dispense Refill  . benzonatate (TESSALON PERLES) 100 MG capsule 1-2 tabs by mouth every 6 hrs as needed for cough 40 capsule 1  . budesonide-formoterol (SYMBICORT) 160-4.5 MCG/ACT inhaler Inhale 2 puffs into the lungs 2 (two) times daily. 2 Inhaler 0  . budesonide-formoterol (SYMBICORT) 160-4.5 MCG/ACT inhaler Inhale 2 puffs into the lungs 2 (two) times daily. 2 Inhaler 0  . chlorpheniramine (CHLOR-TRIMETON) 4 MG tablet Take 2 tablets (8 mg total) by mouth 3 (three) times daily. 90 tablet 2  . Cholecalciferol (VITAMIN D3) 2000 units capsule Take 1 capsule (2,000 Units total) by mouth daily. 100 capsule 3  . citalopram (CELEXA) 10 MG tablet TAKE 1 TABLET BY MOUTH ONCE DAILY. OVERDUE FOR ANNUAL APPT W/LABS MUST SEE MD FOR FUTURE REFILLS. 30 tablet 3  . ergocalciferol (VITAMIN D2) 50000 units capsule Take 1 capsule (50,000 Units total) by mouth once a week. 4 capsule 0  . fluticasone (FLONASE) 50 MCG/ACT nasal spray Place 1 spray into both nostrils 2 (two) times daily. 16 g 2  . Glycopyrrolate-Formoterol (BEVESPI AEROSPHERE) 9-4.8 MCG/ACT AERO Inhale 2 Inhalers into the lungs 2 (two) times daily. 1 Inhaler 3  . Linaclotide (LINZESS) 145 MCG CAPS capsule Take 1 capsule (145 mcg total) by mouth 1 day or 1 dose. 90 capsule 3  . omeprazole (PRILOSEC) 40 MG capsule Take 1 capsule (40 mg total) by mouth daily. 30 capsule 3  . propranolol (INDERAL) 20 MG tablet Take 1-2 tablets (20-40 mg total) by mouth 2 (two) times daily as needed (palpitations). 60 tablet 3  . valACYclovir (VALTREX) 500 MG tablet TAKE 1 CAPLET TWICE A DAY AS NEEDED FOR FEVER BLISTERS 10 tablet 2  . zolmitriptan (ZOMIG) 5 MG tablet Take  1 tablet (5 mg total) by mouth as needed for migraine. 10 tablet 3  . ZOMIG 5 MG nasal solution USE ONE DOSE IN NOSTRIL AS NEEDED FOR MIGRAINE 6 Units 3  . ALPRAZolam (XANAX) 0.25 MG tablet Take 0.25 mg by mouth 2 (two) times daily as needed.     No facility-administered medications prior to visit.     ROS Review of Systems  Constitutional: Negative for appetite change, fatigue and unexpected weight change.  HENT: Positive for congestion. Negative for nosebleeds, sneezing, sore throat and trouble swallowing.   Eyes: Negative for itching and visual disturbance.  Respiratory: Negative for cough.   Cardiovascular: Negative for chest pain, palpitations and leg swelling.  Gastrointestinal: Negative for abdominal distention, blood in stool, diarrhea and nausea.  Genitourinary: Negative for frequency and hematuria.  Musculoskeletal: Negative for back pain, gait problem, joint swelling and neck pain.  Skin: Negative for rash.  Neurological: Negative for dizziness, tremors, speech difficulty and weakness.  Psychiatric/Behavioral: Negative for agitation, dysphoric mood and sleep disturbance. The patient is nervous/anxious.     Objective:  BP 124/72 (BP Location: Left Arm, Patient Position: Sitting, [name] Size: Large)   Pulse 69   Temp 97.8 F (36.6 C) (Oral)   Ht 6\' 2"  (1.88 m)   Wt 204 lb (92.5 kg)   SpO2 100%   BMI 26.19 kg/m   BP Readings from Last 3 Encounters:  02/06/17 124/72  11/28/16 124/70  10/04/16 (!) 140/96    Wt Readings from Last 3 Encounters:  02/06/17 204 lb (92.5 kg)  11/28/16 201 lb 12.8 oz (91.5 kg)  10/04/16 202 lb 6.4 oz (91.8 kg)    Physical Exam  Constitutional: He is oriented to person, place, and time. He appears well-developed. No distress.  NAD  HENT:  Mouth/Throat: Oropharynx is clear and moist.  Eyes: Pupils are equal, round, and reactive to light. Conjunctivae are normal.  Neck: Normal range of motion. No JVD present. No tracheal deviation  present. No thyromegaly present.  Cardiovascular: Normal rate, regular rhythm, normal heart sounds and intact distal pulses.  Exam reveals no gallop and no friction rub.   No murmur heard. Pulmonary/Chest: Effort normal and breath sounds normal. No stridor. No respiratory distress. He has no wheezes. He has no rales. He exhibits no tenderness.  Abdominal: Soft. Bowel sounds are normal. He exhibits no distension and no mass. There is no tenderness. There is no rebound and no guarding.  Genitourinary: Rectum normal and prostate normal. Rectal exam shows guaiac negative stool.  Musculoskeletal: Normal range of motion. He exhibits no edema or tenderness.  Lymphadenopathy:    He has no cervical adenopathy.  Neurological: He is alert and oriented to person, place, and time. He has normal reflexes. No cranial nerve deficit. He exhibits normal muscle tone. He displays a negative Romberg sign. Coordination and gait normal.  Skin: Skin is warm and dry. No rash noted.  Psychiatric: He has a normal mood and affect. His behavior is normal. Judgment and thought content normal.    Lab Results  Component Value Date   WBC 4.5 01/28/2017   HGB 15.0 01/28/2017   HCT 43.2 01/28/2017   PLT 222.0 01/28/2017   GLUCOSE 102 (H) 01/28/2017   CHOL 183 01/28/2017   TRIG 43.0 01/28/2017   HDL 61.60 01/28/2017   LDLDIRECT 150.6 06/29/2013   LDLCALC 113 (H) 01/28/2017   ALT 15 01/28/2017   AST 20 01/28/2017   NA 139 01/28/2017   K 4.3 01/28/2017   CL 104 01/28/2017   CREATININE 0.95 01/28/2017   BUN 16 01/28/2017   CO2 29 01/28/2017   TSH 1.40 01/28/2017   PSA 1.63 01/28/2017   HGBA1C 5.3 12/02/2009    Dg Chest 2 View  Result Date: 07/06/2016 CLINICAL DATA:  Cough and congestion. Shortness of breath. Headaches. EXAM: CHEST  2 VIEW COMPARISON:  None. FINDINGS: The heart size is normal. Lungs are clear. Hyperexpansion is noted. There is no edema or effusion. Degenerative changes are present in the lower  thoracic spine. IMPRESSION: 1. No acute cardiopulmonary disease. 2. Emphysema. Electronically Signed   By: Glen [name] M.D.   On: 07/06/2016 13:29    Assessment & Plan:   There are no diagnoses linked to this encounter. I am having Mr. Gali maintain his ALPRAZolam, propranolol, zolmitriptan, Vitamin D3, omeprazole, linaclotide, ergocalciferol, ZOMIG, Glycopyrrolate-Formoterol, benzonatate, chlorpheniramine, fluticasone, budesonide-formoterol, budesonide-formoterol, valACYclovir, and citalopram.  No orders of the defined types were placed in this encounter.    Follow-up: No Follow-up on file.  Walker Kehr, MD

## 2017-02-06 NOTE — Patient Instructions (Signed)
Use [employer] Peroxicare tooth paste   ?vocal cord dysfunction

## 2017-02-06 NOTE — Assessment & Plan Note (Addendum)
We discussed age appropriate health related issues, including available/recomended screening tests and vaccinations. We discussed a need for adhering to healthy diet and exercise. Labs/EKG were reviewed/ordered. All questions were answered. Colon per Dr Earlean Shawl Shingrix list Flu shot

## 2017-02-19 ENCOUNTER — Ambulatory Visit: Payer: Self-pay | Admitting: Pulmonary Disease

## 2017-02-27 ENCOUNTER — Encounter: Payer: Self-pay | Admitting: Internal Medicine

## 2017-03-15 ENCOUNTER — Encounter: Payer: Self-pay | Admitting: Internal Medicine

## 2017-03-16 ENCOUNTER — Ambulatory Visit: Payer: Self-pay | Admitting: Family Medicine

## 2017-03-18 ENCOUNTER — Ambulatory Visit (INDEPENDENT_AMBULATORY_CARE_PROVIDER_SITE_OTHER): Payer: Self-pay

## 2017-03-18 DIAGNOSIS — Z299 Encounter for prophylactic measures, unspecified: Secondary | ICD-10-CM

## 2017-06-03 ENCOUNTER — Ambulatory Visit: Payer: Self-pay

## 2017-11-25 ENCOUNTER — Encounter: Payer: Self-pay | Admitting: Internal Medicine

## 2017-11-25 ENCOUNTER — Ambulatory Visit (INDEPENDENT_AMBULATORY_CARE_PROVIDER_SITE_OTHER): Payer: Self-pay | Admitting: Internal Medicine

## 2017-11-25 ENCOUNTER — Other Ambulatory Visit (INDEPENDENT_AMBULATORY_CARE_PROVIDER_SITE_OTHER): Payer: Self-pay

## 2017-11-25 DIAGNOSIS — M25473 Effusion, unspecified ankle: Secondary | ICD-10-CM | POA: Insufficient documentation

## 2017-11-25 DIAGNOSIS — M7062 Trochanteric bursitis, left hip: Secondary | ICD-10-CM

## 2017-11-25 LAB — BASIC METABOLIC PANEL
BUN: 18 mg/dL (ref 6–23)
CALCIUM: 9.5 mg/dL (ref 8.4–10.5)
CO2: 30 meq/L (ref 19–32)
Chloride: 103 mEq/L (ref 96–112)
Creatinine, Ser: 0.98 mg/dL (ref 0.40–1.50)
GFR: 85.12 mL/min (ref >60.00)
Glucose, Bld: 101 mg/dL — ABNORMAL HIGH (ref 70–99)
Potassium: 4.5 mEq/L (ref 3.5–5.1)
SODIUM: 140 meq/L (ref 135–145)

## 2017-11-25 NOTE — Patient Instructions (Signed)
Hip opener exercises Dancer's hip  Trochanteric Bursitis Trochanteric bursitis is a condition that causes hip pain. Trochanteric bursitis happens when fluid-filled sacs (bursae) in the hip get irritated. Normally these sacs absorb shock and help strong bands of tissue (tendons) in your hip glide smoothly over each other and over your hip bones. What are the causes? This condition results from increased friction between the hip bones and the tendons that go over them. This condition can happen if you:  Have weak hips.  Use your hip muscles too much (overuse).  Get hit in the hip.  What increases the risk? This condition is more likely to develop in:  Women.  Adults who are middle-aged or older.  People with arthritis or a spinal condition.  People with weak buttocks muscles (gluteal muscles).  People who have one leg that is shorter than the other.  People who participate in certain kinds of athletic activities, such as: ? Running sports, especially long-distance running. ? Contact sports, like football or martial arts. ? Sports in which falls may occur, like skiing.  What are the signs or symptoms? The main symptom of this condition is pain and tenderness over the point of your hip. The pain may be:  Sharp and intense.  Dull and achy.  Felt on the outside of your thigh.  It may increase when you:  Lie on your side.  Walk or run.  Go up on stairs.  Sit.  Stand up after sitting.  Stand for long periods of time.  How is this diagnosed? This condition may be diagnosed based on:  Your symptoms.  Your medical history.  A physical exam.  Imaging tests, such as: ? X-rays to check your bones. ? An MRI or ultrasound to check your tendons and muscles.  During your physical exam, your health care provider will check the movement and strength of your hip. He or she may press on the point of your hip to check for pain. How is this treated? This condition may be  treated by:  Resting.  Reducing your activity.  Avoiding activities that cause pain.  Using crutches, a cane, or a walker to decrease the strain on your hip.  Taking medicine to help with swelling.  Having medicine injected into the bursae to help with swelling.  Using ice, heat, and massage therapy for pain relief.  Physical therapy exercises for strength and flexibility.  Surgery (rare).  Follow these instructions at home: Activity  Rest.  Avoid activities that cause pain.  Return to your normal activities as told by your health care provider. Ask your health care provider what activities are safe for you. Managing pain, stiffness, and swelling  Take over-the-counter and prescription medicines only as told by your health care provider.  If directed, apply heat to the injured area as told by your health care provider. ? Place a towel between your skin and the heat source. ? Leave the heat on for 20-30 minutes. ? Remove the heat if your skin turns bright red. This is especially important if you are unable to feel pain, heat, or cold. You may have a greater risk of getting burned.  If directed, apply ice to the injured area: ? Put ice in a plastic bag. ? Place a towel between your skin and the bag. ? Leave the ice on for 20 minutes, 2-3 times a day. General instructions  If the affected leg is one that you use for driving, ask your health care provider when  it is safe to drive.  Use crutches, a cane, or a walker as told by your health care provider.  If one of your legs is shorter than the other, get fitted for a shoe insert.  Lose weight if you are overweight. How is this prevented?  Wear supportive footwear that is appropriate for your sport.  If you have hip pain, start any new exercise or sport slowly.  Maintain physical fitness, including: ? Strength. ? Flexibility. Contact a health care provider if:  Your pain does not improve with 2-4 weeks. Get  help right away if:  You develop severe pain.  You have a fever.  You develop increased redness over your hip.  You have a change in your bowel function or bladder function.  You cannot control the muscles in your feet. This information is not intended to replace advice given to you by your health care provider. Make sure you discuss any questions you have with your health care provider. Document Released: 07/12/2004 Document Revised: 02/08/2016 Document Reviewed: 05/20/2015 Elsevier Interactive Patient Education  [employer].

## 2017-11-25 NOTE — Assessment & Plan Note (Signed)
D-dimer 

## 2017-11-25 NOTE — Assessment & Plan Note (Signed)
Yoga  

## 2017-11-25 NOTE — Progress Notes (Signed)
Subjective:  Patient ID: Glen [name], male    DOB: 1965-07-23  Age: 52 y.o. MRN: [id number]  CC: No chief complaint on file.   HPI Glen [name] presents for L leg and ankle swelling after return from the beach C/o L hip pain  - locking  Outpatient Medications Prior to Visit  Medication Sig Dispense Refill  . budesonide-formoterol (SYMBICORT) 160-4.5 MCG/ACT inhaler Inhale 2 puffs into the lungs 2 (two) times daily. 2 Inhaler 0  . chlorpheniramine (CHLOR-TRIMETON) 4 MG tablet Take 2 tablets (8 mg total) by mouth 3 (three) times daily. 90 tablet 2  . Cholecalciferol (VITAMIN D3) 2000 units capsule Take 1 capsule (2,000 Units total) by mouth daily. 100 capsule 3  . citalopram (CELEXA) 10 MG tablet TAKE 1 TABLET BY MOUTH ONCE DAILY. 90 tablet 3  . ergocalciferol (VITAMIN D2) 50000 units capsule Take 1 capsule (50,000 Units total) by mouth once a week. 4 capsule 0  . fluticasone (FLONASE) 50 MCG/ACT nasal spray Place 1 spray into both nostrils 2 (two) times daily. 16 g 2  . Glycopyrrolate-Formoterol (BEVESPI AEROSPHERE) 9-4.8 MCG/ACT AERO Inhale 2 Inhalers into the lungs 2 (two) times daily. 1 Inhaler 3  . Linaclotide (LINZESS) 145 MCG CAPS capsule Take 1 capsule (145 mcg total) by mouth 1 day or 1 dose. 90 capsule 3  . omeprazole (PRILOSEC) 40 MG capsule Take 1 capsule (40 mg total) by mouth daily. 30 capsule 3  . propranolol (INDERAL) 20 MG tablet Take 1-2 tablets (20-40 mg total) by mouth 2 (two) times daily as needed (palpitations). 60 tablet 3  . valACYclovir (VALTREX) 500 MG tablet TAKE 1 CAPLET TWICE A DAY x5 days prn AS NEEDED FOR FEVER BLISTERS 30 tablet 1  . zolmitriptan (ZOMIG) 5 MG tablet Take 1 tablet (5 mg total) by mouth as needed for migraine. 10 tablet 3  . ZOMIG 5 MG nasal solution USE ONE DOSE IN NOSTRIL AS NEEDED FOR MIGRAINE 6 Units 3  . ALPRAZolam (XANAX) 0.25 MG tablet Take 0.25 mg by mouth 2 (two) times daily as needed.    . benzonatate (TESSALON PERLES) 100 MG  capsule 1-2 tabs by mouth every 6 hrs as needed for cough 40 capsule 1   No facility-administered medications prior to visit.     ROS: Review of Systems  Constitutional: Negative for appetite change, fatigue and unexpected weight change.  HENT: Negative for congestion, nosebleeds, sneezing, sore throat and trouble swallowing.   Eyes: Negative for itching and visual disturbance.  Respiratory: Negative for cough.   Cardiovascular: Negative for chest pain, palpitations and leg swelling.  Gastrointestinal: Negative for abdominal distention, blood in stool, diarrhea and nausea.  Genitourinary: Negative for frequency and hematuria.  Musculoskeletal: Positive for arthralgias, gait problem and joint swelling. Negative for back pain and neck pain.  Skin: Negative for rash.  Neurological: Negative for dizziness, tremors, speech difficulty and weakness.  Psychiatric/Behavioral: Negative for agitation, dysphoric mood and sleep disturbance. The patient is not nervous/anxious.     Objective:  BP 126/74 (BP Location: Left Arm, Patient Position: Sitting, Cuff Size: Large)   Pulse 66   Temp 98.3 F (36.8 C) (Oral)   Ht 6\' 2"  (1.88 m)   Wt 202 lb (91.6 kg)   SpO2 99%   BMI 25.94 kg/m   BP Readings from Last 3 Encounters:  11/25/17 126/74  02/06/17 124/72  11/28/16 124/70    Wt Readings from Last 3 Encounters:  11/25/17 202 lb (91.6 kg)  02/06/17 204  lb (92.5 kg)  11/28/16 201 lb 12.8 oz (91.5 kg)    Physical Exam  Constitutional: He is oriented to person, place, and time. He appears well-developed. No distress.  NAD  HENT:  Mouth/Throat: Oropharynx is clear and moist.  Eyes: Pupils are equal, round, and reactive to light. Conjunctivae are normal.  Neck: Normal range of motion. No JVD present. No thyromegaly present.  Cardiovascular: Normal rate, regular rhythm, normal heart sounds and intact distal pulses. Exam reveals no gallop and no friction rub.  No murmur  heard. Pulmonary/Chest: Effort normal and breath sounds normal. No respiratory distress. He has no wheezes. He has no rales. He exhibits no tenderness.  Abdominal: Soft. Bowel sounds are normal. He exhibits no distension and no mass. There is no tenderness. There is no rebound and no guarding.  Musculoskeletal: Normal range of motion. He exhibits no edema or tenderness.  Lymphadenopathy:    He has no cervical adenopathy.  Neurological: He is alert and oriented to person, place, and time. He has normal reflexes. No cranial nerve deficit. He exhibits normal muscle tone. He displays a negative Romberg sign. Coordination and gait normal.  Skin: Skin is warm and dry. No rash noted.  Psychiatric: He has a normal mood and affect. His behavior is normal. Judgment and thought content normal.  L troch bursa - tender No edema Calves NT  Lab Results  Component Value Date   WBC 4.5 01/28/2017   HGB 15.0 01/28/2017   HCT 43.2 01/28/2017   PLT 222.0 01/28/2017   GLUCOSE 102 (H) 01/28/2017   CHOL 183 01/28/2017   TRIG 43.0 01/28/2017   HDL 61.60 01/28/2017   LDLDIRECT 150.6 06/29/2013   LDLCALC 113 (H) 01/28/2017   ALT 15 01/28/2017   AST 20 01/28/2017   NA 139 01/28/2017   K 4.3 01/28/2017   CL 104 01/28/2017   CREATININE 0.95 01/28/2017   BUN 16 01/28/2017   CO2 29 01/28/2017   TSH 1.40 01/28/2017   PSA 1.63 01/28/2017   HGBA1C 5.3 12/02/2009    Dg Chest 2 View  Result Date: 07/06/2016 CLINICAL DATA:  Cough and congestion. Shortness of breath. Headaches. EXAM: CHEST  2 VIEW COMPARISON:  None. FINDINGS: The heart size is normal. Lungs are clear. Hyperexpansion is noted. There is no edema or effusion. Degenerative changes are present in the lower thoracic spine. IMPRESSION: 1. No acute cardiopulmonary disease. 2. Emphysema. Electronically Signed   By: San Morelle M.D.   On: 07/06/2016 13:29    Assessment & Plan:   There are no diagnoses linked to this encounter.   No orders  of the defined types were placed in this encounter.    Follow-up: No follow-ups on file.  Walker Kehr, MD

## 2017-11-26 LAB — D-DIMER, QUANTITATIVE: D-Dimer, Quant: 0.19 mcg/mL FEU (ref <0.50)

## 2017-11-29 ENCOUNTER — Encounter

## 2018-01-13 ENCOUNTER — Encounter: Payer: Self-pay | Admitting: Internal Medicine

## 2018-01-13 ENCOUNTER — Ambulatory Visit: Payer: Self-pay | Admitting: Internal Medicine

## 2018-01-13 VITALS — BP 128/82 | HR 62 | Temp 98.5°F | Ht 74.0 in | Wt 192.0 lb

## 2018-01-13 DIAGNOSIS — M25511 Pain in right shoulder: Secondary | ICD-10-CM | POA: Insufficient documentation

## 2018-01-13 DIAGNOSIS — K602 Anal fissure, unspecified: Secondary | ICD-10-CM | POA: Insufficient documentation

## 2018-01-13 MED ORDER — TRIAMCINOLONE ACETONIDE 0.5 % EX OINT: 1.0000 "application " | TOPICAL_OINTMENT | Freq: Two times a day (BID) | CUTANEOUS | 0 refills | Status: AC

## 2018-01-13 MED ORDER — METHYLPREDNISOLONE ACETATE 80 MG/ML IJ SUSP: 80.0000 mg | Freq: Once | INTRAMUSCULAR | Status: DC

## 2018-01-13 MED ORDER — METHYLPREDNISOLONE ACETATE 80 MG/ML IJ SUSP
80.0000 mg | Freq: Once | INTRAMUSCULAR | Status: AC
  Administered 2018-01-13: 80 mg via INTRA_ARTICULAR

## 2018-01-13 NOTE — Assessment & Plan Note (Signed)
x1 mo Will inject subacr and R biceps tendon

## 2018-01-13 NOTE — Progress Notes (Signed)
Subjective:  Patient ID: Glen [name], male    DOB: 12-10-1965  Age: 52 y.o. MRN: [id number]  CC: No chief complaint on file.   HPI Glen [name] presents for R shoulder - injured at yoga 1 mo ago. C/o anal fissur - not healing after a banding procedure. OCD cleaning after BMs. C/o anal itching  Outpatient Medications Prior to Visit  Medication Sig Dispense Refill  . budesonide-formoterol (SYMBICORT) 160-4.5 MCG/ACT inhaler Inhale 2 puffs into the lungs 2 (two) times daily. 2 Inhaler 0  . chlorpheniramine (CHLOR-TRIMETON) 4 MG tablet Take 2 tablets (8 mg total) by mouth 3 (three) times daily. 90 tablet 2  . Cholecalciferol (VITAMIN D3) 2000 units capsule Take 1 capsule (2,000 Units total) by mouth daily. 100 capsule 3  . citalopram (CELEXA) 10 MG tablet TAKE 1 TABLET BY MOUTH ONCE DAILY. 90 tablet 3  . ergocalciferol (VITAMIN D2) 50000 units capsule Take 1 capsule (50,000 Units total) by mouth once a week. 4 capsule 0  . fluticasone (FLONASE) 50 MCG/ACT nasal spray Place 1 spray into both nostrils 2 (two) times daily. 16 g 2  . Glycopyrrolate-Formoterol (BEVESPI AEROSPHERE) 9-4.8 MCG/ACT AERO Inhale 2 Inhalers into the lungs 2 (two) times daily. 1 Inhaler 3  . Linaclotide (LINZESS) 145 MCG CAPS capsule Take 1 capsule (145 mcg total) by mouth 1 day or 1 dose. 90 capsule 3  . omeprazole (PRILOSEC) 40 MG capsule Take 1 capsule (40 mg total) by mouth daily. 30 capsule 3  . propranolol (INDERAL) 20 MG tablet Take 1-2 tablets (20-40 mg total) by mouth 2 (two) times daily as needed (palpitations). 60 tablet 3  . valACYclovir (VALTREX) 500 MG tablet TAKE 1 CAPLET TWICE A DAY x5 days prn AS NEEDED FOR FEVER BLISTERS 30 tablet 1  . zolmitriptan (ZOMIG) 5 MG tablet Take 1 tablet (5 mg total) by mouth as needed for migraine. 10 tablet 3  . ZOMIG 5 MG nasal solution USE ONE DOSE IN NOSTRIL AS NEEDED FOR MIGRAINE 6 Units 3  . ALPRAZolam (XANAX) 0.25 MG tablet Take 0.25 mg by mouth 2 (two) times daily  as needed.     No facility-administered medications prior to visit.     ROS: Review of Systems  Constitutional: Negative for appetite change, fatigue and unexpected weight change.  HENT: Negative for congestion, nosebleeds, sneezing, sore throat and trouble swallowing.   Eyes: Negative for itching and visual disturbance.  Respiratory: Negative for cough.   Cardiovascular: Negative for chest pain, palpitations and leg swelling.  Gastrointestinal: Negative for abdominal distention, anal bleeding, blood in stool, constipation, nausea and rectal pain.  Genitourinary: Negative for frequency and hematuria.  Musculoskeletal: Positive for arthralgias. Negative for back pain, gait problem, joint swelling and neck pain.  Skin: Negative for rash.  Neurological: Negative for dizziness, tremors, speech difficulty and weakness.  Psychiatric/Behavioral: Negative for agitation, dysphoric mood and sleep disturbance. The patient is not nervous/anxious.     Objective:  BP 128/82 (BP Location: Left Arm, Patient Position: Sitting, Cuff Size: Normal)   Pulse 62   Temp 98.5 F (36.9 C) (Oral)   Ht 6\' 2"  (1.88 m)   Wt 192 lb (87.1 kg)   SpO2 99%   BMI 24.65 kg/m   BP Readings from Last 3 Encounters:  01/13/18 128/82  11/25/17 126/74  02/06/17 124/72    Wt Readings from Last 3 Encounters:  01/13/18 192 lb (87.1 kg)  11/25/17 202 lb (91.6 kg)  02/06/17 204 lb (92.5 kg)  Physical Exam  Constitutional: He is oriented to person, place, and time. He appears well-developed. No distress.  NAD  HENT:  Mouth/Throat: Oropharynx is clear and moist.  Eyes: Pupils are equal, round, and reactive to light. Conjunctivae are normal.  Neck: Normal range of motion. No JVD present. No thyromegaly present.  Cardiovascular: Normal rate, regular rhythm, normal heart sounds and intact distal pulses. Exam reveals no gallop and no friction rub.  No murmur heard. Pulmonary/Chest: Effort normal and breath sounds  normal. No respiratory distress. He has no wheezes. He has no rales. He exhibits no tenderness.  Abdominal: Soft. Bowel sounds are normal. He exhibits no distension and no mass. There is no tenderness. There is no rebound and no guarding.  Musculoskeletal: Normal range of motion. He exhibits tenderness. He exhibits no edema.  Lymphadenopathy:    He has no cervical adenopathy.  Neurological: He is alert and oriented to person, place, and time. He has normal reflexes. No cranial nerve deficit. He exhibits normal muscle tone. He displays a negative Romberg sign. Coordination and gait normal.  Skin: Skin is warm and dry. No rash noted.  Psychiatric: He has a normal mood and affect. His behavior is normal. Judgment and thought content normal.  R subacr and R biceps tendon are tender Perianal area w/superficial erosions at 12 o'clock    Procedure :Joint Injection,   shoulder   Indication:  Subacromial bursitis with refractory  Acute/subacute pain.   Risks including unsuccessful procedure , bleeding, infection, bruising, skin atrophy, "steroid flare-up" and others were explained to the patient in detail as well as the benefits. Informed consent was obtained and signed.   Tthe patient was placed in a comfortable position. Lateral approach was used. Skin was prepped with Betadine and alcohol  and anesthetized with a cooling spray. Then, a 5 cc syringe with a 2 inch long 24-gauge needle was used for a joint injection.. The needle was advanced  Into the subacromial space.The bursa was injected with 3 mL of 2% lidocaine and 60 mg of Depo-Medrol .  Band-Aid was applied. R biceps tendon was inj w/20 mg Depo and 1/2 cc Lido. Bandaid applied.   Tolerated well. Complications: None. Good pain relief following the procedure.   Postprocedure instructions :    A Band-Aid should be left on for 12 hours. Injection therapy is not a cure itself. It is used in conjunction with other modalities. You can use  nonsteroidal anti-inflammatories like ibuprofen , hot and cold compresses. Rest is recommended in the next 24 hours. You need to report immediately  if fever, chills or any signs of infection develop.   Lab Results  Component Value Date   WBC 4.5 01/28/2017   HGB 15.0 01/28/2017   HCT 43.2 01/28/2017   PLT 222.0 01/28/2017   GLUCOSE 101 (H) 11/25/2017   CHOL 183 01/28/2017   TRIG 43.0 01/28/2017   HDL 61.60 01/28/2017   LDLDIRECT 150.6 06/29/2013   LDLCALC 113 (H) 01/28/2017   ALT 15 01/28/2017   AST 20 01/28/2017   NA 140 11/25/2017   K 4.5 11/25/2017   CL 103 11/25/2017   CREATININE 0.98 11/25/2017   BUN 18 11/25/2017   CO2 30 11/25/2017   TSH 1.40 01/28/2017   PSA 1.63 01/28/2017   HGBA1C 5.3 12/02/2009    Dg Chest 2 View  Result Date: 07/06/2016 CLINICAL DATA:  Cough and congestion. Shortness of breath. Headaches. EXAM: CHEST  2 VIEW COMPARISON:  None. FINDINGS: The heart size is normal.  Lungs are clear. Hyperexpansion is noted. There is no edema or effusion. Degenerative changes are present in the lower thoracic spine. IMPRESSION: 1. No acute cardiopulmonary disease. 2. Emphysema. Electronically Signed   By: San Morelle M.D.   On: 07/06/2016 13:29    Assessment & Plan:   There are no diagnoses linked to this encounter.   No orders of the defined types were placed in this encounter.    Follow-up: No follow-ups on file.  Walker Kehr, MD

## 2018-01-13 NOTE — Patient Instructions (Addendum)
Sebamed soap Wash, pat dry

## 2018-01-13 NOTE — Assessment & Plan Note (Signed)
Sebamed Triamc 0.5% oint bid-tid

## 2018-02-19 ENCOUNTER — Other Ambulatory Visit: Payer: Self-pay | Admitting: Internal Medicine

## 2018-02-24 LAB — HM COLONOSCOPY

## 2018-02-27 ENCOUNTER — Encounter: Payer: Self-pay | Admitting: Internal Medicine

## 2018-05-09 ENCOUNTER — Other Ambulatory Visit: Payer: Self-pay | Admitting: Internal Medicine

## 2018-05-09 MED ORDER — AZITHROMYCIN 250 MG PO TABS
ORAL_TABLET | ORAL | 0 refills | Status: DC
Start: 2018-05-09 — End: 2018-06-12

## 2018-06-12 ENCOUNTER — Other Ambulatory Visit: Payer: Self-pay | Admitting: Internal Medicine

## 2018-06-12 MED ORDER — DOXYCYCLINE HYCLATE 100 MG PO TABS: 100.0000 mg | ORAL_TABLET | Freq: Two times a day (BID) | ORAL | 0 refills | Status: DC

## 2018-07-14 NOTE — Telephone Encounter (Signed)
See TE in pts chart

## 2018-08-04 ENCOUNTER — Encounter: Payer: Self-pay | Admitting: Internal Medicine

## 2018-08-04 ENCOUNTER — Ambulatory Visit (INDEPENDENT_AMBULATORY_CARE_PROVIDER_SITE_OTHER): Payer: Self-pay | Admitting: Internal Medicine

## 2018-08-04 DIAGNOSIS — J019 Acute sinusitis, unspecified: Secondary | ICD-10-CM | POA: Insufficient documentation

## 2018-08-04 DIAGNOSIS — J0100 Acute maxillary sinusitis, unspecified: Secondary | ICD-10-CM

## 2018-08-04 DIAGNOSIS — H66002 Acute suppurative otitis media without spontaneous rupture of ear drum, left ear: Secondary | ICD-10-CM

## 2018-08-04 DIAGNOSIS — H6690 Otitis media, unspecified, unspecified ear: Secondary | ICD-10-CM | POA: Insufficient documentation

## 2018-08-04 MED ORDER — ALIGN 4 MG PO CAPS: 1.0000 | ORAL_CAPSULE | Freq: Every day | ORAL | 0 refills | Status: DC

## 2018-08-04 MED ORDER — DOXYCYCLINE HYCLATE 100 MG PO TABS: 100.0000 mg | ORAL_TABLET | Freq: Two times a day (BID) | ORAL | 0 refills | Status: DC

## 2018-08-04 NOTE — Assessment & Plan Note (Signed)
Doxy x 2 wks

## 2018-08-04 NOTE — Patient Instructions (Signed)
You can use over-the-counter  "cold" medicines  such as "Tylenol cold" , "Advil cold",  "Mucinex" or" Mucinex D"  for cough and congestion.   Avoid decongestants if you have high blood pressure and use "Afrin" nasal spray for nasal congestion as directed. Use " Delsym" or" Robitussin" cough syrup varietis for cough.  You can use plain "Tylenol" or "Advil" for fever, chills and achyness. Use Halls or Ricola cough drops.   Please, make an appointment if you are not better or if you're worse.  

## 2018-08-04 NOTE — Progress Notes (Signed)
Subjective:  Patient ID: Glen [name], male    DOB: [birth year]  Age: 53 y.o. MRN: [id number]  CC: No chief complaint on file.   HPI Glen [name] presents for sinus congestion, L ear ache since last Wed. He was in [place].   Outpatient Medications Prior to Visit  Medication Sig Dispense Refill  . budesonide-formoterol (SYMBICORT) 160-4.5 MCG/ACT inhaler Inhale 2 puffs into the lungs 2 (two) times daily. 2 Inhaler 0  . chlorpheniramine (CHLOR-TRIMETON) 4 MG tablet Take 2 tablets (8 mg total) by mouth 3 (three) times daily. 90 tablet 2  . Cholecalciferol (VITAMIN D3) 2000 units capsule Take 1 capsule (2,000 Units total) by mouth daily. 100 capsule 3  . citalopram (CELEXA) 10 MG tablet TAKE 1 TABLET BY MOUTH ONCE DAILY 90 tablet 3  . doxycycline (VIBRA-TABS) 100 MG tablet Take 1 tablet (100 mg total) by mouth 2 (two) times daily. 20 tablet 0  . ergocalciferol (VITAMIN D2) 50000 units capsule Take 1 capsule (50,000 Units total) by mouth once a week. 4 capsule 0  . fluticasone (FLONASE) 50 MCG/ACT nasal spray Place 1 spray into both nostrils 2 (two) times daily. 16 g 2  . Glycopyrrolate-Formoterol (BEVESPI AEROSPHERE) 9-4.8 MCG/ACT AERO Inhale 2 Inhalers into the lungs 2 (two) times daily. 1 Inhaler 3  . Linaclotide (LINZESS) 145 MCG CAPS capsule Take 1 capsule (145 mcg total) by mouth 1 day or 1 dose. 90 capsule 3  . omeprazole (PRILOSEC) 40 MG capsule Take 1 capsule (40 mg total) by mouth daily. 30 capsule 3  . propranolol (INDERAL) 20 MG tablet Take 1-2 tablets (20-40 mg total) by mouth 2 (two) times daily as needed (palpitations). 60 tablet 3  . triamcinolone ointment (KENALOG) 0.5 % Apply 1 application topically 2 (two) times daily. 60 g 0  . valACYclovir (VALTREX) 500 MG tablet TAKE 1 CAPLET TWICE A DAY x5 days prn AS NEEDED FOR FEVER BLISTERS 30 tablet 1  . zolmitriptan (ZOMIG) 5 MG tablet Take 1 tablet (5 mg total) by mouth as needed for migraine. 10 tablet 3  . ZOMIG 5 MG nasal  solution USE ONE DOSE IN NOSTRIL AS NEEDED FOR MIGRAINE 6 Units 3  . ALPRAZolam (XANAX) 0.25 MG tablet Take 0.25 mg by mouth 2 (two) times daily as needed.     No facility-administered medications prior to visit.     ROS: Review of Systems  Constitutional: Negative for appetite change, fatigue and unexpected weight change.  HENT: Positive for congestion. Negative for nosebleeds, sneezing, sore throat and trouble swallowing.   Eyes: Negative for itching and visual disturbance.  Respiratory: Negative for cough.   Cardiovascular: Negative for chest pain, palpitations and leg swelling.  Gastrointestinal: Negative for abdominal distention, blood in stool, diarrhea and nausea.  Genitourinary: Negative for frequency and hematuria.  Musculoskeletal: Negative for back pain, gait problem, joint swelling and neck pain.  Skin: Negative for rash.  Neurological: Negative for dizziness, tremors, speech difficulty and weakness.  Psychiatric/Behavioral: Negative for agitation, dysphoric mood and sleep disturbance. The patient is not nervous/anxious.     Objective:  BP 132/84 (BP Location: Left Arm, Patient Position: Sitting, Cuff Size: Normal)   Pulse 72   Temp 97.8 F (36.6 C) (Oral)   Ht 6\' 2"  (1.88 m)   Wt 190 lb (86.2 kg)   SpO2 98%   BMI 24.39 kg/m   BP Readings from Last 3 Encounters:  08/04/18 132/84  01/13/18 128/82  11/25/17 126/74    Wt Readings from  Last 3 Encounters:  08/04/18 190 lb (86.2 kg)  01/13/18 192 lb (87.1 kg)  11/25/17 202 lb (91.6 kg)    Physical Exam Constitutional:      General: He is not in acute distress.    Appearance: He is well-developed.     Comments: NAD  Eyes:     Conjunctiva/sclera: Conjunctivae normal.     Pupils: Pupils are equal, round, and reactive to light.  Neck:     Musculoskeletal: Normal range of motion.     Thyroid: No thyromegaly.     Vascular: No JVD.  Cardiovascular:     Rate and Rhythm: Normal rate and regular rhythm.     Heart  sounds: Normal heart sounds. No murmur. No friction rub. No gallop.   Pulmonary:     Effort: Pulmonary effort is normal. No respiratory distress.     Breath sounds: Normal breath sounds. No wheezing or rales.  Chest:     Chest wall: No tenderness.  Abdominal:     General: Bowel sounds are normal. There is no distension.     Palpations: Abdomen is soft. There is no mass.     Tenderness: There is no abdominal tenderness. There is no guarding or rebound.  Musculoskeletal: Normal range of motion.        General: No tenderness.  Lymphadenopathy:     Cervical: No cervical adenopathy.  Skin:    General: Skin is warm and dry.     Findings: No rash.  Neurological:     Mental Status: He is alert and oriented to person, place, and time.     Cranial Nerves: No cranial nerve deficit.     Motor: No abnormal muscle tone.     Coordination: Coordination normal.     Gait: Gait normal.     Deep Tendon Reflexes: Reflexes are normal and symmetric.  Psychiatric:        Behavior: Behavior normal.        Thought Content: Thought content normal.        Judgment: Judgment normal.    L TM red  Sinus passages - swollen   Lab Results  Component Value Date   WBC 4.5 01/28/2017   HGB 15.0 01/28/2017   HCT 43.2 01/28/2017   PLT 222.0 01/28/2017   GLUCOSE 101 (H) 11/25/2017   CHOL 183 01/28/2017   TRIG 43.0 01/28/2017   HDL 61.60 01/28/2017   LDLDIRECT 150.6 06/29/2013   LDLCALC 113 (H) 01/28/2017   ALT 15 01/28/2017   AST 20 01/28/2017   NA 140 11/25/2017   K 4.5 11/25/2017   CL 103 11/25/2017   CREATININE 0.98 11/25/2017   BUN 18 11/25/2017   CO2 30 11/25/2017   TSH 1.40 01/28/2017   PSA 1.63 01/28/2017   HGBA1C 5.3 12/02/2009    Dg Chest 2 View  Result Date: 07/06/2016 CLINICAL DATA:  Cough and congestion. Shortness of breath. Headaches. EXAM: CHEST  2 VIEW COMPARISON:  None. FINDINGS: The heart size is normal. Lungs are clear. Hyperexpansion is noted. There is no edema or effusion.  Degenerative changes are present in the lower thoracic spine. IMPRESSION: 1. No acute cardiopulmonary disease. 2. Emphysema. Electronically Signed   By: San Morelle M.D.   On: 07/06/2016 13:29    Assessment & Plan:   There are no diagnoses linked to this encounter.   No orders of the defined types were placed in this encounter.    Follow-up: No follow-ups on file.  Walker Kehr, MD

## 2018-08-05 ENCOUNTER — Ambulatory Visit: Payer: Self-pay | Admitting: Internal Medicine

## 2019-02-18 ENCOUNTER — Telehealth: Payer: Self-pay | Admitting: *Deleted

## 2019-02-18 ENCOUNTER — Other Ambulatory Visit: Payer: Self-pay | Admitting: Internal Medicine

## 2019-02-18 DIAGNOSIS — Z0000 Encounter for general adult medical examination without abnormal findings: Secondary | ICD-10-CM

## 2019-02-18 NOTE — Telephone Encounter (Signed)
Done. Thank you.

## 2019-02-18 NOTE — Telephone Encounter (Signed)
Copied from [place] [phone] [id number]. Topic: General - Other >> Feb 18, 2019 10:24 AM Celene Kras A wrote: Reason for CRM: Pt is requesting to have his labs done before the appt on 03/09/2019. Please advise.

## 2019-02-19 NOTE — Telephone Encounter (Signed)
Pt informed of below.  

## 2019-03-02 ENCOUNTER — Other Ambulatory Visit (INDEPENDENT_AMBULATORY_CARE_PROVIDER_SITE_OTHER): Payer: Self-pay

## 2019-03-02 DIAGNOSIS — Z0000 Encounter for general adult medical examination without abnormal findings: Secondary | ICD-10-CM

## 2019-03-02 LAB — HEPATIC FUNCTION PANEL
ALT: 20 U/L (ref 0–53)
AST: 23 U/L (ref 0–37)
Albumin: 4.2 g/dL (ref 3.5–5.2)
Alkaline Phosphatase: 46 U/L (ref 39–117)
Bilirubin, Direct: 0.1 mg/dL (ref 0.0–0.3)
Total Bilirubin: 0.8 mg/dL (ref 0.2–1.2)
Total Protein: 6.6 g/dL (ref 6.0–8.3)

## 2019-03-02 LAB — LIPID PANEL
Cholesterol: 197 mg/dL (ref 0–200)
HDL: 68.4 mg/dL (ref >39.00)
LDL Cholesterol: 121 mg/dL — ABNORMAL HIGH (ref 0–99)
NonHDL: 128.75
Total CHOL/HDL Ratio: 3
Triglycerides: 41 mg/dL (ref 0.0–149.0)
VLDL: 8.2 mg/dL (ref 0.0–40.0)

## 2019-03-02 LAB — CBC WITH DIFFERENTIAL/PLATELET
Basophils Absolute: 0 10*3/uL (ref 0.0–0.1)
Basophils Relative: 0.6 % (ref 0.0–3.0)
Eosinophils Absolute: 0.2 10*3/uL (ref 0.0–0.7)
Eosinophils Relative: 3.1 % (ref 0.0–5.0)
HCT: 42 % (ref 39.0–52.0)
Hemoglobin: 14.8 g/dL (ref 13.0–17.0)
Lymphocytes Relative: 43.4 % (ref 12.0–46.0)
Lymphs Abs: 2.2 10*3/uL (ref 0.7–4.0)
MCHC: 35.1 g/dL (ref 30.0–36.0)
MCV: 91.7 fl (ref 78.0–100.0)
Monocytes Absolute: 0.4 10*3/uL (ref 0.1–1.0)
Monocytes Relative: 7.7 % (ref 3.0–12.0)
Neutro Abs: 2.3 10*3/uL (ref 1.4–7.7)
Neutrophils Relative %: 45.2 % (ref 43.0–77.0)
Platelets: 227 10*3/uL (ref 150.0–400.0)
RBC: 4.58 Mil/uL (ref 4.22–5.81)
RDW: 12.7 % (ref 11.5–15.5)
WBC: 5.1 10*3/uL (ref 4.0–10.5)

## 2019-03-02 LAB — BASIC METABOLIC PANEL
BUN: 19 mg/dL (ref 6–23)
CO2: 30 mEq/L (ref 19–32)
Calcium: 9.7 mg/dL (ref 8.4–10.5)
Chloride: 101 mEq/L (ref 96–112)
Creatinine, Ser: 0.99 mg/dL (ref 0.40–1.50)
GFR: 78.77 mL/min (ref >60.00)
Glucose, Bld: 96 mg/dL (ref 70–99)
Potassium: 4 mEq/L (ref 3.5–5.1)
Sodium: 138 mEq/L (ref 135–145)

## 2019-03-02 LAB — URINALYSIS
Bilirubin Urine: NEGATIVE
Hgb urine dipstick: NEGATIVE
Ketones, ur: NEGATIVE
Leukocytes,Ua: NEGATIVE
Nitrite: NEGATIVE
Specific Gravity, Urine: 1.02 (ref 1.000–1.030)
Total Protein, Urine: NEGATIVE
Urine Glucose: NEGATIVE
Urobilinogen, UA: 0.2 (ref 0.0–1.0)
pH: 7 (ref 5.0–8.0)

## 2019-03-02 LAB — TSH: TSH: 1.35 u[IU]/mL (ref 0.35–4.50)

## 2019-03-02 LAB — PSA: PSA: 0.9 ng/mL (ref 0.10–4.00)

## 2019-03-09 ENCOUNTER — Other Ambulatory Visit: Payer: Self-pay

## 2019-03-09 ENCOUNTER — Ambulatory Visit (INDEPENDENT_AMBULATORY_CARE_PROVIDER_SITE_OTHER): Payer: Self-pay | Admitting: Internal Medicine

## 2019-03-09 ENCOUNTER — Encounter: Payer: Self-pay | Admitting: Internal Medicine

## 2019-03-09 DIAGNOSIS — Z0000 Encounter for general adult medical examination without abnormal findings: Secondary | ICD-10-CM

## 2019-03-09 DIAGNOSIS — E785 Hyperlipidemia, unspecified: Secondary | ICD-10-CM | POA: Insufficient documentation

## 2019-03-09 DIAGNOSIS — E559 Vitamin D deficiency, unspecified: Secondary | ICD-10-CM

## 2019-03-09 MED ORDER — CITALOPRAM HYDROBROMIDE 10 MG PO TABS: ORAL_TABLET | ORAL | 3 refills | Status: DC

## 2019-03-09 NOTE — Assessment & Plan Note (Signed)
Doing well 

## 2019-03-09 NOTE — Assessment & Plan Note (Addendum)
A cardiac CT scan for calcium scoring offered 

## 2019-03-09 NOTE — Assessment & Plan Note (Addendum)
We discussed age appropriate health related issues, including available/recomended screening tests and vaccinations. We discussed a need for adhering to healthy diet and exercise. Labs were reviewed . All questions were answered. A cardiac CT scan for calcium scoring offered 9/20

## 2019-03-09 NOTE — Progress Notes (Signed)
Subjective:  Patient ID: Glen [name], male    DOB: June 11, 1966  Age: 53 y.o. MRN: [id number]  CC: No chief complaint on file.   HPI Glen [name] presents for well exam  Outpatient Medications Prior to Visit  Medication Sig Dispense Refill  . budesonide-formoterol (SYMBICORT) 160-4.5 MCG/ACT inhaler Inhale 2 puffs into the lungs 2 (two) times daily. 2 Inhaler 0  . chlorpheniramine (CHLOR-TRIMETON) 4 MG tablet Take 2 tablets (8 mg total) by mouth 3 (three) times daily. 90 tablet 2  . Cholecalciferol (VITAMIN D3) 2000 units capsule Take 1 capsule (2,000 Units total) by mouth daily. 100 capsule 3  . doxycycline (VIBRA-TABS) 100 MG tablet Take 1 tablet (100 mg total) by mouth 2 (two) times daily. 28 tablet 0  . ergocalciferol (VITAMIN D2) 50000 units capsule Take 1 capsule (50,000 Units total) by mouth once a week. 4 capsule 0  . fluticasone (FLONASE) 50 MCG/ACT nasal spray Place 1 spray into both nostrils 2 (two) times daily. 16 g 2  . Glycopyrrolate-Formoterol (BEVESPI AEROSPHERE) 9-4.8 MCG/ACT AERO Inhale 2 Inhalers into the lungs 2 (two) times daily. 1 Inhaler 3  . Linaclotide (LINZESS) 145 MCG CAPS capsule Take 1 capsule (145 mcg total) by mouth 1 day or 1 dose. 90 capsule 3  . omeprazole (PRILOSEC) 40 MG capsule Take 1 capsule (40 mg total) by mouth daily. 30 capsule 3  . Probiotic Product (ALIGN) 4 MG CAPS Take 1 capsule (4 mg total) by mouth daily. 30 capsule 0  . propranolol (INDERAL) 20 MG tablet Take 1-2 tablets (20-40 mg total) by mouth 2 (two) times daily as needed (palpitations). 60 tablet 3  . valACYclovir (VALTREX) 500 MG tablet TAKE 1 CAPLET TWICE A DAY x5 days prn AS NEEDED FOR FEVER BLISTERS 30 tablet 1  . zolmitriptan (ZOMIG) 5 MG tablet Take 1 tablet (5 mg total) by mouth as needed for migraine. 10 tablet 3  . ZOMIG 5 MG nasal solution USE ONE DOSE IN NOSTRIL AS NEEDED FOR MIGRAINE 6 Units 3  . citalopram (CELEXA) 10 MG tablet TAKE 1 TABLET BY MOUTH ONCE DAILY. **OFFICE  VISIT DUE** 90 tablet 0  . ALPRAZolam (XANAX) 0.25 MG tablet Take 0.25 mg by mouth 2 (two) times daily as needed.     No facility-administered medications prior to visit.     ROS: Review of Systems  Constitutional: Negative for appetite change, fatigue and unexpected weight change.  HENT: Negative for congestion, nosebleeds, sneezing, sore throat and trouble swallowing.   Eyes: Negative for itching and visual disturbance.  Respiratory: Negative for cough.   Cardiovascular: Negative for chest pain, palpitations and leg swelling.  Gastrointestinal: Negative for abdominal distention, blood in stool, diarrhea and nausea.  Genitourinary: Negative for frequency and hematuria.  Musculoskeletal: Positive for back pain. Negative for gait problem, joint swelling and neck pain.  Skin: Negative for rash.  Neurological: Negative for dizziness, tremors, speech difficulty and weakness.  Psychiatric/Behavioral: Positive for sleep disturbance. Negative for agitation, dysphoric mood and suicidal ideas. The patient is nervous/anxious.     Objective:  BP 130/84 (BP Location: Left Arm, Patient Position: Sitting, Cuff Size: Normal)   Pulse 63   Temp 98.4 F (36.9 C) (Oral)   Ht 6\' 2"  (1.88 m)   Wt 196 lb (88.9 kg)   SpO2 98%   BMI 25.16 kg/m   BP Readings from Last 3 Encounters:  03/09/19 130/84  08/04/18 132/84  01/13/18 128/82    Wt Readings from Last 3 Encounters:  03/09/19 196 lb (88.9 kg)  08/04/18 190 lb (86.2 kg)  01/13/18 192 lb (87.1 kg)    Physical Exam Constitutional:      General: He is not in acute distress.    Appearance: He is well-developed.     Comments: NAD  Eyes:     Conjunctiva/sclera: Conjunctivae normal.     Pupils: Pupils are equal, round, and reactive to light.  Neck:     Musculoskeletal: Normal range of motion.     Thyroid: No thyromegaly.     Vascular: No JVD.  Cardiovascular:     Rate and Rhythm: Normal rate and regular rhythm.     Heart sounds: Normal  heart sounds. No murmur. No friction rub. No gallop.   Pulmonary:     Effort: Pulmonary effort is normal. No respiratory distress.     Breath sounds: Normal breath sounds. No wheezing or rales.  Chest:     Chest wall: No tenderness.  Abdominal:     General: Bowel sounds are normal. There is no distension.     Palpations: Abdomen is soft. There is no mass.     Tenderness: There is no abdominal tenderness. There is no guarding or rebound.  Musculoskeletal: Normal range of motion.        General: No tenderness.  Lymphadenopathy:     Cervical: No cervical adenopathy.  Skin:    General: Skin is warm and dry.     Findings: No rash.  Neurological:     Mental Status: He is alert and oriented to person, place, and time.     Cranial Nerves: No cranial nerve deficit.     Motor: No abnormal muscle tone.     Coordination: Coordination normal.     Gait: Gait normal.     Deep Tendon Reflexes: Reflexes are normal and symmetric.  Psychiatric:        Behavior: Behavior normal.        Thought Content: Thought content normal.        Judgment: Judgment normal.    Rectal - per Dr Earlean Shawl Lab Results  Component Value Date   WBC 5.1 03/02/2019   HGB 14.8 03/02/2019   HCT 42.0 03/02/2019   PLT 227.0 03/02/2019   GLUCOSE 96 03/02/2019   CHOL 197 03/02/2019   TRIG 41.0 03/02/2019   HDL 68.40 03/02/2019   LDLDIRECT 150.6 06/29/2013   LDLCALC 121 (H) 03/02/2019   ALT 20 03/02/2019   AST 23 03/02/2019   NA 138 03/02/2019   K 4.0 03/02/2019   CL 101 03/02/2019   CREATININE 0.99 03/02/2019   BUN 19 03/02/2019   CO2 30 03/02/2019   TSH 1.35 03/02/2019   PSA 0.90 03/02/2019   HGBA1C 5.3 12/02/2009    Dg Chest 2 View  Result Date: 07/06/2016 CLINICAL DATA:  Cough and congestion. Shortness of breath. Headaches. EXAM: CHEST  2 VIEW COMPARISON:  None. FINDINGS: The heart size is normal. Lungs are clear. Hyperexpansion is noted. There is no edema or effusion. Degenerative changes are present in the  lower thoracic spine. IMPRESSION: 1. No acute cardiopulmonary disease. 2. Emphysema. Electronically Signed   By: San Morelle M.D.   On: 07/06/2016 13:29    Assessment & Plan:   Diagnoses and all orders for this visit:  Vitamin D deficiency  Other orders -     citalopram (CELEXA) 10 MG tablet; TAKE 1 TABLET BY MOUTH ONCE DAILY     Meds ordered this encounter  Medications  . citalopram (CELEXA)  10 MG tablet    Sig: TAKE 1 TABLET BY MOUTH ONCE DAILY    Dispense:  90 tablet    Refill:  3     Follow-up: No follow-ups on file.  Walker Kehr, MD

## 2019-03-09 NOTE — Patient Instructions (Signed)
These suggestions will probably help you to improve your metabolism if you are not overweight and to lose weight if you are overweight: 1.  Reduce your consumption of sugars and starches.  Eliminate high fructose corn syrup from your diet.  Reduce your consumption of processed foods.  For desserts try to have seasonal fruits, berries, nuts, cheeses or dark chocolate with more than 70% cacao. 2.  Do not snack 3.  You do not have to eat breakfast.  If you choose to have breakfast-eat plain greek yogurt, eggs, oatmeal (without sugar) 4.  Drink water, freshly brewed unsweetened tea (green, black or herbal) or coffee.  Do not drink sodas including diet sodas , juices, beverages sweetened with artificial sweeteners. 5.  Reduce your consumption of refined grains. 6.  Avoid protein drinks such as Optifast, Slim fast etc. Eat chicken, fish, meat, dairy and beans for your sources of protein 7.  Natural unprocessed fats like cold pressed virgin olive oil, butter, coconut oil are good for you.  Eat avocados 8.  Increase your consumption of fiber.  Fruits, berries, vegetables, whole grains, flaxseeds, Chia seeds, beans, popcorn, nuts, oatmeal are good sources of fiber 9.  Use vinegar in your diet, i.e. apple cider vinegar, red wine or balsamic vinegar 10.  You can try fasting.  For example you can skip breakfast and lunch every other day (24-hour fast) 11.  Stress reduction, good night sleep, relaxation, meditation, yoga and other physical activity is likely to help you to maintain low weight too. 12.  If you drink alcohol, limit your alcohol intake to no more than 2 drinks a day.   Mediterranean diet is good for you. ([employer] has a typical Mediterranean cuisine menu) The Mediterranean diet is a way of eating based on the traditional cuisine of countries bordering the [place] Sea. While there is no single definition of the Mediterranean diet, it is typically high in vegetables, fruits, whole grains,  beans, nut and seeds, and olive oil. The main components of Mediterranean diet include: . Daily consumption of vegetables, fruits, whole grains and healthy fats  . Weekly intake of fish, poultry, beans and eggs  . Moderate portions of dairy products  . Limited intake of red meat Other important elements of the Mediterranean diet are sharing meals with family and friends, enjoying a glass of red wine and being physically active. Health benefits of a Mediterranean diet: A traditional Mediterranean diet consisting of large quantities of fresh fruits and vegetables, nuts, fish and olive oil-coupled with physical activity-can reduce your risk of serious mental and physical health problems by: Preventing heart disease and strokes. Following a Mediterranean diet limits your intake of refined breads, processed foods, and red meat, and encourages drinking red wine instead of hard liquor-all factors that can help prevent heart disease and stroke. Keeping you agile. If you're an older adult, the nutrients gained with a Mediterranean diet may reduce your risk of developing muscle weakness and other signs of frailty by about 70 percent. Reducing the risk of Alzheimer's. Research suggests that the Mediterranean diet may improve cholesterol, blood sugar levels, and overall blood vessel health, which in turn may reduce your risk of Alzheimer's disease or dementia. Halving the risk of Parkinson's disease. The high levels of antioxidants in the Mediterranean diet can prevent cells from undergoing a damaging process called oxidative stress, thereby cutting the risk of Parkinson's disease in half. Increasing longevity. By reducing your risk of developing heart disease or cancer with the Mediterranean diet,   you're reducing your risk of death at any age by 20%. Protecting against type 2 diabetes. A Mediterranean diet is rich in fiber which digests slowly, prevents huge swings in blood sugar, and can help you maintain a  healthy weight.    Cabbage soup recipe that will not make you gain weight: Take 1 small head of cabbage, 1 average pack of celery, 4 green peppers, 4 onions, 2 cans diced tomatoes (they are not available without salt), salt and spices to taste.  Chop cabbage, celery, peppers and onions.  And tomatoes and 2-2.5 liters (2.5 quarts) of water so that it would just cover the vegetables.  Bring to boil.  Add spices and salt.  Turn heat to low/medium and simmer for 20-25 minutes.  Naturally, you can make a smaller batch and change some of the ingredients.   Cardiac CT calcium scoring test $150   Computed tomography, more commonly known as a CT or CAT scan, is a diagnostic medical imaging test. Like traditional x-rays, it produces multiple images or pictures of the inside of the body. The cross-sectional images generated during a CT scan can be reformatted in multiple planes. They can even generate three-dimensional images. These images can be viewed on a computer monitor, printed on film or by a 3D printer, or transferred to a CD or DVD. CT images of internal organs, bones, soft tissue and blood vessels provide greater detail than traditional x-rays, particularly of soft tissues and blood vessels. A cardiac CT scan for coronary calcium is a non-invasive way of obtaining information about the presence, location and extent of calcified plaque in the coronary arteries-the vessels that supply oxygen-containing blood to the heart muscle. Calcified plaque results when there is a build-up of fat and other substances under the inner layer of the artery. This material can calcify which signals the presence of atherosclerosis, a disease of the vessel wall, also called coronary artery disease (CAD). People with this disease have an increased risk for heart attacks. In addition, over time, progression of plaque build up (CAD) can narrow the arteries or even close off blood flow to the heart. The result may be chest pain,  sometimes called "angina," or a heart attack. Because calcium is a marker of CAD, the amount of calcium detected on a cardiac CT scan is a helpful prognostic tool. The findings on cardiac CT are expressed as a calcium score. Another name for this test is coronary artery calcium scoring.  What are some common uses of the procedure? The goal of cardiac CT scan for calcium scoring is to determine if CAD is present and to what extent, even if there are no symptoms. It is a screening study that may be recommended by a physician for patients with risk factors for CAD but no clinical symptoms. The major risk factors for CAD are: . high blood cholesterol levels  . family history of heart attacks  . diabetes  . high blood pressure  . cigarette smoking  . overweight or obese  . physical inactivity   A negative cardiac CT scan for calcium scoring shows no calcification within the coronary arteries. This suggests that CAD is absent or so minimal it cannot be seen by this technique. The chance of having a heart attack over the next two to five years is very low under these circumstances. A positive test means that CAD is present, regardless of whether or not the patient is experiencing any symptoms. The amount of calcification-expressed as the calcium score-may   help to predict the likelihood of a myocardial infarction (heart attack) in the coming years and helps your medical doctor or cardiologist decide whether the patient may need to take preventive medicine or undertake other measures such as diet and exercise to lower the risk for heart attack. The extent of CAD is graded according to your calcium score:  Calcium Score  Presence of CAD (coronary artery disease)  0 No evidence of CAD   1-10 Minimal evidence of CAD  11-100 Mild evidence of CAD  101-400 Moderate evidence of CAD  Over 400 Extensive evidence of CAD    

## 2019-05-12 ENCOUNTER — Other Ambulatory Visit: Payer: Self-pay

## 2019-05-12 DIAGNOSIS — Z20822 Contact with and (suspected) exposure to covid-19: Secondary | ICD-10-CM

## 2019-05-14 LAB — NOVEL CORONAVIRUS, NAA: SARS-CoV-2, NAA: NOT DETECTED

## 2019-05-15 ENCOUNTER — Other Ambulatory Visit: Payer: Self-pay | Admitting: Internal Medicine

## 2019-07-06 ENCOUNTER — Ambulatory Visit: Payer: Self-pay | Attending: Internal Medicine

## 2019-07-06 DIAGNOSIS — Z20822 Contact with and (suspected) exposure to covid-19: Secondary | ICD-10-CM | POA: Insufficient documentation

## 2019-07-07 LAB — NOVEL CORONAVIRUS, NAA: SARS-CoV-2, NAA: NOT DETECTED

## 2019-08-17 ENCOUNTER — Ambulatory Visit (INDEPENDENT_AMBULATORY_CARE_PROVIDER_SITE_OTHER): Payer: Self-pay | Admitting: Family Medicine

## 2019-08-17 ENCOUNTER — Ambulatory Visit: Payer: Self-pay

## 2019-08-17 ENCOUNTER — Encounter: Payer: Self-pay | Admitting: Family Medicine

## 2019-08-17 ENCOUNTER — Other Ambulatory Visit: Payer: Self-pay

## 2019-08-17 VITALS — BP 120/70 | Ht 74.0 in | Wt 195.0 lb

## 2019-08-17 DIAGNOSIS — M79672 Pain in left foot: Secondary | ICD-10-CM

## 2019-08-17 NOTE — Patient Instructions (Signed)
You have plantar fasciitis Take tylenol and/or aleve as needed for pain  Plantar fascia stretch for 20-30 seconds (do 3 of these) in morning Lowering/raise on a step exercises 3 x 10 once or twice a day - this is very important for long term recovery. Can add heel walks, toe walks forward and backward as well Ice heel for 15 minutes as needed. Avoid flat shoes/barefoot walking as much as possible. Arch straps have been shown to help with pain. Wear the orthotics when up and walking around. Steroid injection is a consideration for short term pain relief if you are struggling. Physical therapy is also an option. Follow up with me in 1 month but call me sooner if you're struggling.

## 2019-08-17 NOTE — Progress Notes (Signed)
PCP: Plotnikov, Evie Lacks, MD  Subjective:   HPI: Patient is a 54 y.o. male here for left heel pain.  Patient reports he's had about 4 months of plantar left heel pain. No known trauma or injury. He had been running about 6-8 miles a day 4 times a week. Pain worse first thing in the morning but improves with ambulation. Also worse if he sits for prolonged period then goes to get up. Tried some home exercises, night splint, sleeve, has old orthotics. Feels like he's stepping on a spike with ambulation. Had x-rays back in December showing a small heel spur posteriorly. No swelling, skin changes.  Past Medical History:  Diagnosis Date  . Allergic rhinitis   . Anxiety   . Asthmatic bronchitis   . Dry eyes   . GERD (gastroesophageal reflux disease)   . Hepatitis B infection 2008   recovered  . Hypothyroidism 2010   Dr. Wende Bushy  . Internal hemorrhoids   . Urethritis 2010    Current Outpatient Medications on File Prior to Visit  Medication Sig Dispense Refill  . ALPRAZolam (XANAX) 0.25 MG tablet Take 0.25 mg by mouth 2 (two) times daily as needed.    . budesonide-formoterol (SYMBICORT) 160-4.5 MCG/ACT inhaler Inhale 2 puffs into the lungs 2 (two) times daily. 2 Inhaler 0  . chlorpheniramine (CHLOR-TRIMETON) 4 MG tablet Take 2 tablets (8 mg total) by mouth 3 (three) times daily. 90 tablet 2  . Cholecalciferol (VITAMIN D3) 2000 units capsule Take 1 capsule (2,000 Units total) by mouth daily. 100 capsule 3  . citalopram (CELEXA) 10 MG tablet TAKE 1 TABLET BY MOUTH ONCE DAILY 90 tablet 3  . doxycycline (VIBRA-TABS) 100 MG tablet Take 1 tablet (100 mg total) by mouth 2 (two) times daily. 28 tablet 0  . ergocalciferol (VITAMIN D2) 50000 units capsule Take 1 capsule (50,000 Units total) by mouth once a week. 4 capsule 0  . fluticasone (FLONASE) 50 MCG/ACT nasal spray Place 1 spray into both nostrils 2 (two) times daily. 16 g 2  . Glycopyrrolate-Formoterol (BEVESPI AEROSPHERE) 9-4.8  MCG/ACT AERO Inhale 2 Inhalers into the lungs 2 (two) times daily. 1 Inhaler 3  . Linaclotide (LINZESS) 145 MCG CAPS capsule Take 1 capsule (145 mcg total) by mouth 1 day or 1 dose. 90 capsule 3  . omeprazole (PRILOSEC) 40 MG capsule Take 1 capsule (40 mg total) by mouth daily. 30 capsule 3  . Probiotic Product (ALIGN) 4 MG CAPS Take 1 capsule (4 mg total) by mouth daily. 30 capsule 0  . propranolol (INDERAL) 20 MG tablet Take 1-2 tablets (20-40 mg total) by mouth 2 (two) times daily as needed (palpitations). 60 tablet 3  . valACYclovir (VALTREX) 500 MG tablet TAKE 1 TABLET BY MOUTH TWICE DAILY FOR 5 DAYS AS NEEDED FOR FEVER BLISTERS 30 tablet 0  . zolmitriptan (ZOMIG) 5 MG tablet Take 1 tablet (5 mg total) by mouth as needed for migraine. 10 tablet 3  . ZOMIG 5 MG nasal solution USE ONE DOSE IN NOSTRIL AS NEEDED FOR MIGRAINE 6 Units 3   No current facility-administered medications on file prior to visit.    Past Surgical History:  Procedure Laterality Date  . FACIAL COSMETIC SURGERY    . LIPOSUCTION    . TEAR DUCT PROBING  2009    Allergies  Allergen Reactions  . Codeine     ? Mental status changes  . Penicillins     Mother said don't take    Social History  Socioeconomic History  . Marital status: Married    Spouse name: Not on file  . Number of children: Not on file  . Years of education: Not on file  . Highest education level: Not on file  Occupational History  . Not on file  Tobacco Use  . Smoking status: Former Smoker    Packs/day: 1.50    Types: Cigarettes    Quit date: 06/19/1999    Years since quitting: 20.1  . Smokeless tobacco: Never Used  Substance and Sexual Activity  . Alcohol use: Yes  . Drug use: No  . Sexual activity: Yes  Other Topics Concern  . Not on file  Social History Narrative   Regular Exercise- yes   Social Determinants of Health   Financial Resource Strain:   . Difficulty of Paying Living Expenses: Not on file  Food Insecurity:   .  Worried About Charity fundraiser in the Last Year: Not on file  . Ran Out of Food in the Last Year: Not on file  Transportation Needs:   . Lack of Transportation (Medical): Not on file  . Lack of Transportation (Non-Medical): Not on file  Physical Activity:   . Days of Exercise per Week: Not on file  . Minutes of Exercise per Session: Not on file  Stress:   . Feeling of Stress : Not on file  Social Connections:   . Frequency of Communication with Friends and Family: Not on file  . Frequency of Social Gatherings with Friends and Family: Not on file  . Attends Religious Services: Not on file  . Active Member of Clubs or Organizations: Not on file  . Attends Archivist Meetings: Not on file  . Marital Status: Not on file  Intimate Partner Violence:   . Fear of Current or Ex-Partner: Not on file  . Emotionally Abused: Not on file  . Physically Abused: Not on file  . Sexually Abused: Not on file    Family History  Problem Relation Age of Onset  . Stroke Mother   . Lupus Mother   . Arthritis Mother   . Depression Other   . Hypertension Other   . Stroke Other   . Heart disease Paternal Uncle 70       atrial fib  . Heart disease Paternal Grandfather 53       MI  . Arthritis Maternal Grandmother     BP 120/70   Ht 6\' 2"  (1.88 m)   Wt 195 lb (88.5 kg)   BMI 25.04 kg/m   Review of Systems: See HPI above.     Objective:  Physical Exam:  Gen: NAD, comfortable in exam room  Left foot/ankle: Mild pronation.  Morton's foot.  No other gross deformity, swelling, ecchymoses FROM ankle with 5/5 strength without pain. TTP medial and plantar anterior calcaneus at PF insertion. Negative ant drawer and talar tilt.   Negative syndesmotic compression. Negative calcaneal squeeze. Thompsons test negative. NV intact distally.   MSK u/s left foot:  PF thickened at 0.50cm2 with edema proximal aspect, small spur.  No cortical irregularities of calcaneus.  Assessment &  Plan:  1. Left plantar fasciitis - history, exam, ultrasound consistent with this.  Shown home exercises and stretches to do daily.  Arch binders.  Ice, tylenol/nsaids if needed.  New custom orthotics made today with additional heel lifts.  F/u in 1 month for reevaluation.  Patient was fitted for a : standard, cushioned, semi-rigid orthotic. The orthotic was heated  and afterward the patient stood on the orthotic blank positioned on the orthotic stand. The patient was positioned in subtalar neutral position and 10 degrees of ankle dorsiflexion in a weight bearing stance. After completion of molding, a stable base was applied to the orthotic blank. The blank was ground to a stable position for weight bearing. Size: 13 red cambray Base: blue med density eva Posting: none Additional orthotic padding: bilateral 5/16" heel lifts

## 2019-09-21 ENCOUNTER — Ambulatory Visit: Payer: Self-pay | Admitting: Family Medicine

## 2019-11-24 ENCOUNTER — Other Ambulatory Visit: Payer: Self-pay | Admitting: Internal Medicine

## 2019-11-24 MED ORDER — DOXYCYCLINE HYCLATE 100 MG PO TABS: 100.0000 mg | ORAL_TABLET | Freq: Two times a day (BID) | ORAL | 0 refills | Status: DC

## 2020-01-08 ENCOUNTER — Encounter: Payer: Self-pay | Admitting: Family Medicine

## 2020-01-08 ENCOUNTER — Other Ambulatory Visit: Payer: Self-pay

## 2020-01-08 ENCOUNTER — Ambulatory Visit (INDEPENDENT_AMBULATORY_CARE_PROVIDER_SITE_OTHER): Payer: Self-pay | Admitting: Family Medicine

## 2020-01-08 VITALS — BP 124/72 | Ht 74.0 in | Wt 200.0 lb

## 2020-01-08 DIAGNOSIS — M79672 Pain in left foot: Secondary | ICD-10-CM

## 2020-01-08 MED ORDER — METHYLPREDNISOLONE ACETATE 40 MG/ML IJ SUSP
40.0000 mg | Freq: Once | INTRAMUSCULAR | Status: AC
  Administered 2020-01-08: 40 mg via INTRA_ARTICULAR

## 2020-01-08 NOTE — Progress Notes (Signed)
PCP: Plotnikov, Evie Lacks, MD  Subjective:   HPI: Patient is a 54 y.o. male here for left heel pain.  3/1: Patient reports he's had about 4 months of plantar left heel pain. No known trauma or injury. He had been running about 6-8 miles a day 4 times a week. Pain worse first thing in the morning but improves with ambulation. Also worse if he sits for prolonged period then goes to get up. Tried some home exercises, night splint, sleeve, has old orthotics. Feels like he's stepping on a spike with ambulation. Had x-rays back in December showing a small heel spur posteriorly. No swelling, skin changes.  7/23: Patient reports he's tried orthotics, home exercises (though admits he could be more diligent with these), heel cups, OTC inserts. Heel cups seemed to help. Pain very severe this weekend - has improved some past couple days. Still felt medial plantar heel, worse with weight bearing.  Past Medical History:  Diagnosis Date  . Allergic rhinitis   . Anxiety   . Asthmatic bronchitis   . Dry eyes   . GERD (gastroesophageal reflux disease)   . Hepatitis B infection 2008   recovered  . Hypothyroidism 2010   Dr. Wende Bushy  . Internal hemorrhoids   . Urethritis 2010    Current Outpatient Medications on File Prior to Visit  Medication Sig Dispense Refill  . ALPRAZolam (XANAX) 0.25 MG tablet Take 0.25 mg by mouth 2 (two) times daily as needed.    . budesonide-formoterol (SYMBICORT) 160-4.5 MCG/ACT inhaler Inhale 2 puffs into the lungs 2 (two) times daily. 2 Inhaler 0  . chlorpheniramine (CHLOR-TRIMETON) 4 MG tablet Take 2 tablets (8 mg total) by mouth 3 (three) times daily. 90 tablet 2  . Cholecalciferol (VITAMIN D3) 2000 units capsule Take 1 capsule (2,000 Units total) by mouth daily. 100 capsule 3  . citalopram (CELEXA) 10 MG tablet TAKE 1 TABLET BY MOUTH ONCE DAILY 90 tablet 3  . doxycycline (VIBRA-TABS) 100 MG tablet Take 1 tablet (100 mg total) by mouth 2 (two) times daily.  28 tablet 0  . ergocalciferol (VITAMIN D2) 50000 units capsule Take 1 capsule (50,000 Units total) by mouth once a week. 4 capsule 0  . fluticasone (FLONASE) 50 MCG/ACT nasal spray Place 1 spray into both nostrils 2 (two) times daily. 16 g 2  . Glycopyrrolate-Formoterol (BEVESPI AEROSPHERE) 9-4.8 MCG/ACT AERO Inhale 2 Inhalers into the lungs 2 (two) times daily. 1 Inhaler 3  . Linaclotide (LINZESS) 145 MCG CAPS capsule Take 1 capsule (145 mcg total) by mouth 1 day or 1 dose. 90 capsule 3  . omeprazole (PRILOSEC) 40 MG capsule Take 1 capsule (40 mg total) by mouth daily. 30 capsule 3  . Probiotic Product (ALIGN) 4 MG CAPS Take 1 capsule (4 mg total) by mouth daily. 30 capsule 0  . propranolol (INDERAL) 20 MG tablet Take 1-2 tablets (20-40 mg total) by mouth 2 (two) times daily as needed (palpitations). 60 tablet 3  . valACYclovir (VALTREX) 500 MG tablet TAKE 1 TABLET BY MOUTH TWICE DAILY FOR 5 DAYS AS NEEDED FOR FEVER BLISTERS 30 tablet 0  . zolmitriptan (ZOMIG) 5 MG tablet Take 1 tablet (5 mg total) by mouth as needed for migraine. 10 tablet 3  . ZOMIG 5 MG nasal solution USE ONE DOSE IN NOSTRIL AS NEEDED FOR MIGRAINE 6 Units 3   No current facility-administered medications on file prior to visit.    Past Surgical History:  Procedure Laterality Date  . FACIAL COSMETIC  SURGERY    . LIPOSUCTION    . TEAR DUCT PROBING  2009    Allergies  Allergen Reactions  . Codeine     ? Mental status changes  . Penicillins     Mother said don't take    Social History   Socioeconomic History  . Marital status: Married    Spouse name: Not on file  . Number of children: Not on file  . Years of education: Not on file  . Highest education level: Not on file  Occupational History  . Not on file  Tobacco Use  . Smoking status: Former Smoker    Packs/day: 1.50    Types: Cigarettes    Quit date: 06/19/1999    Years since quitting: 20.5  . Smokeless tobacco: Never Used  Substance and Sexual  Activity  . Alcohol use: Yes  . Drug use: No  . Sexual activity: Yes  Other Topics Concern  . Not on file  Social History Narrative   Regular Exercise- yes   Social Determinants of Health   Financial Resource Strain:   . Difficulty of Paying Living Expenses:   Food Insecurity:   . Worried About Charity fundraiser in the Last Year:   . Arboriculturist in the Last Year:   Transportation Needs:   . Film/video editor (Medical):   Marland Kitchen Lack of Transportation (Non-Medical):   Physical Activity:   . Days of Exercise per Week:   . Minutes of Exercise per Session:   Stress:   . Feeling of Stress :   Social Connections:   . Frequency of Communication with Friends and Family:   . Frequency of Social Gatherings with Friends and Family:   . Attends Religious Services:   . Active Member of Clubs or Organizations:   . Attends Archivist Meetings:   Marland Kitchen Marital Status:   Intimate Partner Violence:   . Fear of Current or Ex-Partner:   . Emotionally Abused:   Marland Kitchen Physically Abused:   . Sexually Abused:     Family History  Problem Relation Age of Onset  . Stroke Mother   . Lupus Mother   . Arthritis Mother   . Depression Other   . Hypertension Other   . Stroke Other   . Heart disease Paternal Uncle 70       atrial fib  . Heart disease Paternal Grandfather 28       MI  . Arthritis Maternal Grandmother     BP 124/72   Ht 6\' 2"  (1.88 m)   Wt 200 lb (90.7 kg)   BMI 25.68 kg/m   Review of Systems: See HPI above.     Objective:  Physical Exam:  Gen: NAD, comfortable in exam room  Left foot/ankle: Mild pronation.  Morton's foot. No other gross deformity, swelling, ecchymoses FROM TTP medial plantar calcaneus at PF insertion. Negative calcaneal squeeze. NV intact distally.  Assessment & Plan:  1. Left plantar fasciitis - Continue with arch supports and heel cups, home exercises/stretches.  Injection given today.  F/u in 1 month.  Consider formal PT.  After  informed written consent timeout was performed, patient was seated on exam table. Area overlying left plantar fascia proximally prepped with alcohol swab then utilizing ultrasound guidance, patients left plantar fascia was injected with 2:1 bupivicaine: depomedrol. Patient tolerated the procedure well without immediate complications.

## 2020-01-08 NOTE — Patient Instructions (Signed)
You were given a steroid injection today. Continue with the inserts, heel cups, home exercises, icing. Follow up with me in 1 month for reevaluation. Consider physical therapy.

## 2020-01-18 ENCOUNTER — Other Ambulatory Visit: Payer: Self-pay

## 2020-01-18 ENCOUNTER — Ambulatory Visit (INDEPENDENT_AMBULATORY_CARE_PROVIDER_SITE_OTHER): Payer: Self-pay | Admitting: Family Medicine

## 2020-01-18 ENCOUNTER — Encounter: Payer: Self-pay | Admitting: Family Medicine

## 2020-01-18 DIAGNOSIS — M1811 Unilateral primary osteoarthritis of first carpometacarpal joint, right hand: Secondary | ICD-10-CM

## 2020-01-18 DIAGNOSIS — M722 Plantar fascial fibromatosis: Secondary | ICD-10-CM

## 2020-01-18 DIAGNOSIS — M19039 Primary osteoarthritis, unspecified wrist: Secondary | ICD-10-CM | POA: Insufficient documentation

## 2020-01-18 MED ORDER — METHYLPREDNISOLONE ACETATE 40 MG/ML IJ SUSP
40.0000 mg | Freq: Once | INTRAMUSCULAR | Status: AC
  Administered 2020-01-18: 40 mg via INTRA_ARTICULAR

## 2020-01-18 MED ORDER — METHYLPREDNISOLONE ACETATE 40 MG/ML IJ SUSP
20.0000 mg | Freq: Once | INTRAMUSCULAR | Status: AC
  Administered 2020-01-18: 20 mg via INTRA_ARTICULAR

## 2020-01-18 NOTE — Progress Notes (Signed)
PCP: Plotnikov, Evie Lacks, MD  Subjective:   CC: Patient is a 54 y.o. male here for heel pain f/u and wrist pain.  HPI:  Plantar fasciitis-patient experienced about 40% improvement in his overall pain since the steroid injection about 2 weeks ago.  He continues to do his exercises at least twice a day and using ice on his heel at least twice per day.  He uses heel inserts in his tennis shoes daily along with his custom orthotics.  He has been resting and not running since the last injection.  Pain character has not changed.  Described as burning sensation in arch of foot that radiates to lateral and medial sides of the foot and is worsened by overuse.  Feels like there is a rock in his shoe when he steps on his heel.  Has difficulties with maintaining his foot in a normal position while driving.  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  He has had pain like this in the past and was seen by hand surgeon who fitted him for a custom brace which helped resolve the pain several years ago.  He still has the brace and has been using it the last couple of weeks that this pain is flared up again.  The pain is similar to what he felt in the past and has had some improvement with use of the splints at home.  Described as a aching pain in the right MCP area that is worsened with overuse at work.  Denies any numbness or tingling or weakness in his fingers or thumb.  Past Medical History:  Diagnosis Date  . Allergic rhinitis   . Anxiety   . Asthmatic bronchitis   . Dry eyes   . GERD (gastroesophageal reflux disease)   . Hepatitis B infection 2008   recovered  . Hypothyroidism 2010   Dr. Wende Bushy  . Internal hemorrhoids   . Urethritis 2010   Current Outpatient Medications on File Prior to Visit  Medication Sig Dispense Refill  . ALPRAZolam (XANAX) 0.25 MG tablet Take 0.25 mg by mouth 2 (two) times daily as needed.    .  budesonide-formoterol (SYMBICORT) 160-4.5 MCG/ACT inhaler Inhale 2 puffs into the lungs 2 (two) times daily. 2 Inhaler 0  . chlorpheniramine (CHLOR-TRIMETON) 4 MG tablet Take 2 tablets (8 mg total) by mouth 3 (three) times daily. 90 tablet 2  . Cholecalciferol (VITAMIN D3) 2000 units capsule Take 1 capsule (2,000 Units total) by mouth daily. 100 capsule 3  . citalopram (CELEXA) 10 MG tablet TAKE 1 TABLET BY MOUTH ONCE DAILY 90 tablet 3  . doxycycline (VIBRA-TABS) 100 MG tablet Take 1 tablet (100 mg total) by mouth 2 (two) times daily. 28 tablet 0  . ergocalciferol (VITAMIN D2) 50000 units capsule Take 1 capsule (50,000 Units total) by mouth once a week. 4 capsule 0  . fluticasone (FLONASE) 50 MCG/ACT nasal spray Place 1 spray into both nostrils 2 (two) times daily. 16 g 2  . Glycopyrrolate-Formoterol (BEVESPI AEROSPHERE) 9-4.8 MCG/ACT AERO Inhale 2 Inhalers into the lungs 2 (two) times daily. 1 Inhaler 3  . Linaclotide (LINZESS) 145 MCG CAPS capsule Take 1 capsule (145 mcg total) by mouth 1 day or 1 dose. 90 capsule 3  . omeprazole (PRILOSEC) 40 MG capsule Take 1 capsule (40 mg total) by mouth daily. 30 capsule 3  . Probiotic Product (ALIGN) 4 MG CAPS Take 1 capsule (4 mg total) by mouth  daily. 30 capsule 0  . propranolol (INDERAL) 20 MG tablet Take 1-2 tablets (20-40 mg total) by mouth 2 (two) times daily as needed (palpitations). 60 tablet 3  . valACYclovir (VALTREX) 500 MG tablet TAKE 1 TABLET BY MOUTH TWICE DAILY FOR 5 DAYS AS NEEDED FOR FEVER BLISTERS 30 tablet 0  . zolmitriptan (ZOMIG) 5 MG tablet Take 1 tablet (5 mg total) by mouth as needed for migraine. 10 tablet 3  . ZOMIG 5 MG nasal solution USE ONE DOSE IN NOSTRIL AS NEEDED FOR MIGRAINE 6 Units 3   No current facility-administered medications on file prior to visit.   Past Surgical History:  Procedure Laterality Date  . FACIAL COSMETIC SURGERY    . LIPOSUCTION    . TEAR DUCT PROBING  2009   Allergies  Allergen Reactions  .  Codeine     ? Mental status changes  . Penicillins     Mother said don't take   Social History   Socioeconomic History  . Marital status: Married    Spouse name: Not on file  . Number of children: Not on file  . Years of education: Not on file  . Highest education level: Not on file  Occupational History  . Not on file  Tobacco Use  . Smoking status: Former Smoker    Packs/day: 1.50    Types: Cigarettes    Quit date: 06/19/1999    Years since quitting: 20.5  . Smokeless tobacco: Never Used  Substance and Sexual Activity  . Alcohol use: Yes  . Drug use: No  . Sexual activity: Yes  Other Topics Concern  . Not on file  Social History Narrative   Regular Exercise- yes   Social Determinants of Health   Financial Resource Strain:   . Difficulty of Paying Living Expenses:   Food Insecurity:   . Worried About Charity fundraiser in the Last Year:   . Arboriculturist in the Last Year:   Transportation Needs:   . Film/video editor (Medical):   Marland Kitchen Lack of Transportation (Non-Medical):   Physical Activity:   . Days of Exercise per Week:   . Minutes of Exercise per Session:   Stress:   . Feeling of Stress :   Social Connections:   . Frequency of Communication with Friends and Family:   . Frequency of Social Gatherings with Friends and Family:   . Attends Religious Services:   . Active Member of Clubs or Organizations:   . Attends Archivist Meetings:   Marland Kitchen Marital Status:   Intimate Partner Violence:   . Fear of Current or Ex-Partner:   . Emotionally Abused:   Marland Kitchen Physically Abused:   . Sexually Abused:    Family History  Problem Relation Age of Onset  . Stroke Mother   . Lupus Mother   . Arthritis Mother   . Depression Other   . Hypertension Other   . Stroke Other   . Heart disease Paternal Uncle 70       atrial fib  . Heart disease Paternal Grandfather 39       MI  . Arthritis Maternal Grandmother     ROS: See HPI above. I have personally  reviewed pertinent past medical history, surgical, family, and social history as appropriate.     Objective:  BP 120/78   Ht 6\' 2"  (1.88 m)   Wt 200 lb (90.7 kg)   BMI 25.68 kg/m   Physical Exam: Gen: NAD,  comfortable in exam room  L foot Observation: Normal appearance Palpation: Pain to palpation of plantar aspect of calcaneus at PF insertion. ROM: Full without pain. Strength: Full Sensation: Intact Special tests: Negative heel squeeze test.  Negative thompsons.  R wrist Observation: Swelling volar wrist just radial to radial artery pulse consistent with synovial cyst; strong pulses and < 2 sec cap refill.  Otherwise normal appearance.  Negative for atrophy of thenar or hypothenar eminence Palpation: Tenderness over 1st CMC joint.  No 1st dorsal compartment, carpal tunnel, other tenderness. ROM: Full Strength: Full.  No pain with resisted thumb motions. Sensation: Intact Special tests: Negative Tinel's, finkelsteins.   Assessment & Plan:  Plantar fasciitis of left foot Performed repeat steroid injection today Follow-up in approximately 4 weeks Continue other conservative methods including shoe inserts, icing, stretching  After informed written consent timeout was performed, patient was seated on exam table. Area overlying left plantar fascia proximally prepped with alcohol swab then utilizing ultrasound guidance, patients left plantar fascia was injected with 2:1 bupivicaine: depomedrol. Patient tolerated the procedure well without immediate complications.  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 Area overlying right 1st CMC prepped with alcohol swab then utilizing ultrasound guidance, patients right 1st CMC was injected with 0.5:0.66mL bupivicaine:  depomedrol. Patient tolerated the procedure well without immediate complications.  Doristine Mango, [facility] PGY-3

## 2020-01-18 NOTE — Assessment & Plan Note (Addendum)
Ultrasound-guided steroid injection today of MCP Continue splinting while not working as much as possible Topical Voltaren and ice Reassess in 1 month

## 2020-01-18 NOTE — Assessment & Plan Note (Signed)
Performed repeat steroid injection today Follow-up in approximately 4 weeks Continue other conservative methods including shoe inserts, icing, stretching

## 2020-02-15 ENCOUNTER — Other Ambulatory Visit: Payer: Self-pay | Admitting: Internal Medicine

## 2020-03-21 ENCOUNTER — Telehealth: Payer: Self-pay | Admitting: Internal Medicine

## 2020-03-21 NOTE — Telephone Encounter (Signed)
Received records from [facility] forwarded 2 pages to Dr. Walker Kehr 10/4/21fbg

## 2020-03-23 ENCOUNTER — Ambulatory Visit (INDEPENDENT_AMBULATORY_CARE_PROVIDER_SITE_OTHER): Payer: Self-pay | Admitting: Podiatry

## 2020-03-23 ENCOUNTER — Other Ambulatory Visit: Payer: Self-pay

## 2020-03-23 DIAGNOSIS — M21862 Other specified acquired deformities of left lower leg: Secondary | ICD-10-CM

## 2020-03-23 DIAGNOSIS — M722 Plantar fascial fibromatosis: Secondary | ICD-10-CM

## 2020-03-23 DIAGNOSIS — M216X2 Other acquired deformities of left foot: Secondary | ICD-10-CM

## 2020-03-24 ENCOUNTER — Telehealth: Payer: Self-pay | Admitting: Podiatry

## 2020-03-24 ENCOUNTER — Encounter: Payer: Self-pay | Admitting: Podiatry

## 2020-03-24 NOTE — Telephone Encounter (Signed)
patePatient said his arch is still really tight and the shot did not help loosen it. Should I schedule him to come back sooner for another shot in the arch? Or what are his options

## 2020-03-24 NOTE — Progress Notes (Signed)
Subjective:  Patient ID: Glen [name], male    DOB: 07-28-1965,  MRN: [id number]  Chief Complaint  Patient presents with  . Plantar Fasciitis    Pt stated he has really bad Plantar fasciitis and has had 2 previous injections and one was helpful the other was not. Pt said his foot feels tight and he just wants to have some relief    54 y.o. male presents with the above complaint. Patient presents with complaint of chronic plantar fasciitis to the left foot as well as underlying gastrocnemius equinus.  Patient states that he has been treated by the previous physicians for which he has received two steroid injections in the past which were somewhat helpful but still kept coming back.  Patient states that the foot feels tight somewhat of some relief.  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  He would like to discuss surgical options as he has failed all conservative treatment options including immobilization with a cam boot.  He denies any other acute complaints he has been dealing with it for a year.  His pain scale is 8 out of 10 and has progressive gotten worse.   Review of Systems: Negative except as noted in the HPI. Denies N/V/F/Ch.  Past Medical History:  Diagnosis Date  . Allergic rhinitis   . Anxiety   . Asthmatic bronchitis   . Dry eyes   . GERD (gastroesophageal reflux disease)   . Hepatitis B infection 2008   recovered  . Hypothyroidism 2010   Dr. Wende Bushy  . Internal hemorrhoids   . Urethritis 2010    Current Outpatient Medications:  .  ALPRAZolam (XANAX) 0.25 MG tablet, Take 0.25 mg by mouth 2 (two) times daily as needed., Disp: , Rfl:  .  budesonide-formoterol (SYMBICORT) 160-4.5 MCG/ACT inhaler, Inhale 2 puffs into the lungs 2 (two) times daily., Disp: 2 Inhaler, Rfl: 0 .  chlorpheniramine (CHLOR-TRIMETON) 4 MG tablet, Take 2 tablets (8 mg total) by mouth 3 (three) times daily., Disp: 90 tablet, Rfl: 2 .  Cholecalciferol (VITAMIN D3) 2000 units  capsule, Take 1 capsule (2,000 Units total) by mouth daily., Disp: 100 capsule, Rfl: 3 .  citalopram (CELEXA) 10 MG tablet, TAKE 1 TABLET BY MOUTH ONCE DAILY, Disp: 90 tablet, Rfl: 3 .  doxycycline (VIBRA-TABS) 100 MG tablet, Take 1 tablet (100 mg total) by mouth 2 (two) times daily., Disp: 28 tablet, Rfl: 0 .  ergocalciferol (VITAMIN D2) 50000 units capsule, Take 1 capsule (50,000 Units total) by mouth once a week., Disp: 4 capsule, Rfl: 0 .  fluticasone (FLONASE) 50 MCG/ACT nasal spray, Place 1 spray into both nostrils 2 (two) times daily., Disp: 16 g, Rfl: 2 .  Glycopyrrolate-Formoterol (BEVESPI AEROSPHERE) 9-4.8 MCG/ACT AERO, Inhale 2 Inhalers into the lungs 2 (two) times daily., Disp: 1 Inhaler, Rfl: 3 .  Linaclotide (LINZESS) 145 MCG CAPS capsule, Take 1 capsule (145 mcg total) by mouth 1 day or 1 dose., Disp: 90 capsule, Rfl: 3 .  omeprazole (PRILOSEC) 40 MG capsule, Take 1 capsule (40 mg total) by mouth daily., Disp: 30 capsule, Rfl: 3 .  Probiotic Product (ALIGN) 4 MG CAPS, Take 1 capsule (4 mg total) by mouth daily., Disp: 30 capsule, Rfl: 0 .  propranolol (INDERAL) 20 MG tablet, Take 1-2 tablets (20-40 mg total) by mouth 2 (two) times daily as needed (palpitations)., Disp: 60 tablet, Rfl: 3 .  valACYclovir (VALTREX) 500 MG tablet, TAKE 1 TABLET BY MOUTH TWICE DAILY  AS NEEDED FOR FEVER BLISTERS FOR 5 DAYS, Disp: 30 tablet, Rfl: 0 .  zolmitriptan (ZOMIG) 5 MG tablet, Take 1 tablet (5 mg total) by mouth as needed for migraine., Disp: 10 tablet, Rfl: 3 .  ZOMIG 5 MG nasal solution, USE ONE DOSE IN NOSTRIL AS NEEDED FOR MIGRAINE, Disp: 6 Units, Rfl: 3  Social History   Tobacco Use  Smoking Status Former Smoker  . Packs/day: 1.50  . Types: Cigarettes  . Quit date: 06/19/1999  . Years since quitting: 20.7  Smokeless Tobacco Never Used    Allergies  Allergen Reactions  . Codeine     ? Mental status changes  . Penicillins     Mother said don't take   Objective:  There were no vitals  filed for this visit. There is no height or weight on file to calculate BMI. Constitutional Well developed. Well nourished.  Vascular Dorsalis pedis pulses palpable bilaterally. Posterior tibial pulses palpable bilaterally. Capillary refill normal to all digits.  No cyanosis or clubbing noted. Pedal hair growth normal.  Neurologic Normal speech. Oriented to person, place, and time. Epicritic sensation to light touch grossly present bilaterally.  Dermatologic Nails well groomed and normal in appearance. No open wounds. No skin lesions.  Orthopedic: Normal joint ROM without pain or crepitus bilaterally. No visible deformities. Tender to palpation at the calcaneal tuber left. No pain with calcaneal squeeze left. Ankle ROM diminished range of motion left. Silfverskiold Test: positive left.   Radiographs: None  Assessment:   1. Plantar fasciitis, left   2. Gastrocnemius equinus, left    Plan:  Patient was evaluated and treated and all questions answered.  Plantar Fasciitis, left with underlying gastrocnemius equinus -Given that patient has been illness for such a long time and is chronic in nature.  Given that patient has failed all conservative treatment options including immobilization bracing orthotics as well as multiple steroid injections at this time I believe patient will benefit from surgical options.  I discussed with the patient that he will benefit from addressing the gastrocnemius equinus and plantar fasciitis by performing gastrocnemius recession and" scopic plantar fasciotomy.  I discussed my surgical plan in extensive detail including incision sites.  I discussed with him all the options that are available to him.  He understands the risk and would like to proceed with the surgery.  -He will be weightbearing as tolerated in cam boot after the surgery. -He already has a cam boot have instructed him to bring it with him to the surgery center.  Patient states  understanding -Given that surgery will be for another 3 weeks I will perform another steroid injection to give him some temporary relief in the meantime.  Patient states understanding. -Informed surgical risk consent was reviewed and read aloud to the patient.  I reviewed the films.  I have discussed my findings with the patient in great detail.  I have discussed all risks including but not limited to infection, stiffness, scarring, limp, disability, deformity, damage to blood vessels and nerves, numbness, poor healing, need for braces, arthritis, chronic pain, amputation, death.  All benefits and realistic expectations discussed in great detail.  I have made no promises as to the outcome.  I have provided realistic expectations.  I have offered the patient a 2nd opinion, which they have declined and assured me they preferred to proceed despite the risks -A total of 44 minutes was spent in direct patient care as well as pre and post patient encounter activities.  This  includes documentation as well as reviewing patient chart for labs, imaging, past medical, surgical, social, and family history as documented in the EMR.  I have reviewed medication allergies as documented in EMR.  I discussed the etiology of condition and treatment options from conservative to surgical care.  All risks and benefit of the treatment course was discussed in detail.  All questions were answered and return appointment was discussed.  Since the visit completed in an ambulatory/outpatient setting, the patient and/or parent/guardian has been advised to contact the providers office for worsening condition and seek medical treatment and/or call 911 if the patient deems either is necessary.   Procedure: Injection Tendon/Ligament Location: Left plantar fascia at the glabrous junction; medial approach. Skin Prep: alcohol Injectate: 0.5 cc 0.5% marcaine plain, 0.5 cc of 1% Lidocaine, 0.5 cc kenalog 10. Disposition: Patient tolerated  procedure well. Injection site dressed with a band-aid.  No follow-ups on file.

## 2020-04-18 DIAGNOSIS — M216X2 Other acquired deformities of left foot: Secondary | ICD-10-CM

## 2020-04-18 DIAGNOSIS — M722 Plantar fascial fibromatosis: Secondary | ICD-10-CM

## 2020-04-18 MED ORDER — KETOROLAC TROMETHAMINE 10 MG PO TABS: 10.0000 mg | ORAL_TABLET | Freq: Four times a day (QID) | ORAL | 0 refills | Status: DC | PRN

## 2020-04-18 MED ORDER — TRAMADOL HCL 50 MG PO TABS: 50.0000 mg | ORAL_TABLET | Freq: Three times a day (TID) | ORAL | 0 refills | Status: AC | PRN

## 2020-04-18 NOTE — Addendum Note (Signed)
Addended by: Boneta Lucks on: 04/18/2020 09:58 AM   Modules accepted: Orders

## 2020-04-27 ENCOUNTER — Other Ambulatory Visit: Payer: Self-pay

## 2020-04-27 ENCOUNTER — Encounter: Payer: Self-pay | Admitting: Podiatry

## 2020-04-27 ENCOUNTER — Ambulatory Visit (INDEPENDENT_AMBULATORY_CARE_PROVIDER_SITE_OTHER): Payer: Self-pay | Admitting: Podiatry

## 2020-04-27 DIAGNOSIS — M722 Plantar fascial fibromatosis: Secondary | ICD-10-CM

## 2020-04-27 DIAGNOSIS — M21862 Other specified acquired deformities of left lower leg: Secondary | ICD-10-CM

## 2020-04-27 DIAGNOSIS — M216X2 Other acquired deformities of left foot: Secondary | ICD-10-CM

## 2020-04-27 DIAGNOSIS — Z9889 Other specified postprocedural states: Secondary | ICD-10-CM

## 2020-04-27 NOTE — Progress Notes (Signed)
Subjective:  Patient ID: Glen [name], male    DOB: June 17, 1966,  MRN: [id number]  Chief Complaint  Patient presents with  . Routine Post Op    POV #1 DOS 04/18/2020 EPF LT, GASTROCNEMIUS RECESS LT     54 y.o. male returns for post-op check.  Patient is doing a lot better.  He states he does not have pain.  He has been ambulating with the boot on.  He states that he feels a little bit of tightness but overall doing much better.  He denies any other acute complaints.  Review of Systems: Negative except as noted in the HPI. Denies N/V/F/Ch.  Past Medical History:  Diagnosis Date  . Allergic rhinitis   . Anxiety   . Asthmatic bronchitis   . Dry eyes   . GERD (gastroesophageal reflux disease)   . Hepatitis B infection 2008   recovered  . Hypothyroidism 2010   Dr. Wende Bushy  . Internal hemorrhoids   . Urethritis 2010    Current Outpatient Medications:  .  budesonide-formoterol (SYMBICORT) 160-4.5 MCG/ACT inhaler, Inhale 2 puffs into the lungs 2 (two) times daily., Disp: 2 Inhaler, Rfl: 0 .  chlorpheniramine (CHLOR-TRIMETON) 4 MG tablet, Take 2 tablets (8 mg total) by mouth 3 (three) times daily., Disp: 90 tablet, Rfl: 2 .  Cholecalciferol (VITAMIN D3) 2000 units capsule, Take 1 capsule (2,000 Units total) by mouth daily., Disp: 100 capsule, Rfl: 3 .  citalopram (CELEXA) 10 MG tablet, TAKE 1 TABLET BY MOUTH ONCE DAILY, Disp: 90 tablet, Rfl: 3 .  ergocalciferol (VITAMIN D2) 50000 units capsule, Take 1 capsule (50,000 Units total) by mouth once a week., Disp: 4 capsule, Rfl: 0 .  fluticasone (FLONASE) 50 MCG/ACT nasal spray, Place 1 spray into both nostrils 2 (two) times daily., Disp: 16 g, Rfl: 2 .  Glycopyrrolate-Formoterol (BEVESPI AEROSPHERE) 9-4.8 MCG/ACT AERO, Inhale 2 Inhalers into the lungs 2 (two) times daily., Disp: 1 Inhaler, Rfl: 3 .  ketorolac (TORADOL) 10 MG tablet, Take 1 tablet (10 mg total) by mouth every 6 (six) hours as needed., Disp: 20 tablet, Rfl: 0 .   Linaclotide (LINZESS) 145 MCG CAPS capsule, Take 1 capsule (145 mcg total) by mouth 1 day or 1 dose., Disp: 90 capsule, Rfl: 3 .  omeprazole (PRILOSEC) 40 MG capsule, Take 1 capsule (40 mg total) by mouth daily., Disp: 30 capsule, Rfl: 3 .  Probiotic Product (ALIGN) 4 MG CAPS, Take 1 capsule (4 mg total) by mouth daily., Disp: 30 capsule, Rfl: 0 .  propranolol (INDERAL) 20 MG tablet, Take 1-2 tablets (20-40 mg total) by mouth 2 (two) times daily as needed (palpitations)., Disp: 60 tablet, Rfl: 3 .  valACYclovir (VALTREX) 500 MG tablet, TAKE 1 TABLET BY MOUTH TWICE DAILY AS NEEDED FOR FEVER BLISTERS FOR 5 DAYS, Disp: 30 tablet, Rfl: 0 .  zolmitriptan (ZOMIG) 5 MG tablet, Take 1 tablet (5 mg total) by mouth as needed for migraine., Disp: 10 tablet, Rfl: 3 .  ZOMIG 5 MG nasal solution, USE ONE DOSE IN NOSTRIL AS NEEDED FOR MIGRAINE, Disp: 6 Units, Rfl: 3 .  ALPRAZolam (XANAX) 0.25 MG tablet, Take 0.25 mg by mouth 2 (two) times daily as needed., Disp: , Rfl:  .  doxycycline (VIBRA-TABS) 100 MG tablet, Take 1 tablet (100 mg total) by mouth 2 (two) times daily., Disp: 28 tablet, Rfl: 0  Social History   Tobacco Use  Smoking Status Former Smoker  . Packs/day: 1.50  . Types: Cigarettes  . Quit date:  06/19/1999  . Years since quitting: 20.8  Smokeless Tobacco Never Used    Allergies  Allergen Reactions  . Codeine     ? Mental status changes  . Penicillins     Mother said don't take   Objective:  There were no vitals filed for this visit. There is no height or weight on file to calculate BMI. Constitutional Well developed. Well nourished.  Vascular Foot warm and well perfused. Capillary refill normal to all digits.   Neurologic Normal speech. Oriented to person, place, and time. Epicritic sensation to light touch grossly present bilaterally.  Dermatologic Skin healing well without signs of infection. Skin edges well coapted without signs of infection.  Orthopedic: Tenderness to palpation  noted about the surgical site.   Radiographs: None Assessment:   1. Plantar fasciitis, left   2. Gastrocnemius equinus, left   3. Status post foot surgery    Plan:  Patient was evaluated and treated and all questions answered.  S/p foot surgery left -Progressing as expected post-operatively. -XR: None -WB Status: Weightbearing as tolerated in cam boot -Sutures: Intact.  No signs of dehiscence noted.  No clinical signs of infection noted. -Medications: None -Foot redressed.  No follow-ups on file.

## 2020-05-03 ENCOUNTER — Ambulatory Visit
Admission: RE | Admit: 2020-05-03 | Discharge: 2020-05-03 | Disposition: A | Discharge status: Home / Self Care | Payer: No Typology Code available for payment source | Source: Ambulatory Visit | Attending: Sports Medicine | Admitting: Sports Medicine

## 2020-05-03 ENCOUNTER — Ambulatory Visit (INDEPENDENT_AMBULATORY_CARE_PROVIDER_SITE_OTHER): Payer: Self-pay | Admitting: Sports Medicine

## 2020-05-03 ENCOUNTER — Other Ambulatory Visit: Payer: Self-pay

## 2020-05-03 VITALS — BP 128/80 | Ht 74.0 in | Wt 200.0 lb

## 2020-05-03 DIAGNOSIS — M5412 Radiculopathy, cervical region: Secondary | ICD-10-CM

## 2020-05-03 DIAGNOSIS — M25512 Pain in left shoulder: Secondary | ICD-10-CM | POA: Insufficient documentation

## 2020-05-03 MED ORDER — CYCLOBENZAPRINE HCL 10 MG PO TABS
10.0000 mg | ORAL_TABLET | Freq: Every evening | ORAL | 0 refills | Status: DC | PRN
Start: 2020-05-03 — End: 2020-05-16

## 2020-05-03 MED ORDER — PREDNISONE 10 MG PO TABS: ORAL_TABLET | ORAL | 0 refills | Status: DC

## 2020-05-03 NOTE — Progress Notes (Signed)
    SUBJECTIVE:   CHIEF COMPLAINT / HPI:   Left shoulder pain Glen [name] is a pleasant 54 year old male who presents as a established patient to this practice with 4 weeks of left shoulder pain.  He remembers no inciting event no previous history of left shoulder injury.  He describes it as "just painful" it has been getting worse.  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  He states that he has tried heat, massage guns, Tylenol and these help but the pain comes right back and is slowly worse.  PERTINENT  PMH / PSH: History of hypothyroidism, GERD, hepatitis B  OBJECTIVE:   BP 128/80   Ht 6\' 2"  (1.88 m)   Wt 200 lb (90.7 kg)   BMI 25.68 kg/m   No flowsheet data found.  Shoulder, Left: No evidence of bony deformity, asymmetry, or muscle atrophy; No tenderness over long head of biceps (bicipital groove). No TTP at [facility] joint. Full active and passive range of motion (180 flex Glen [name] /150Abd /90ER /70IR), Thumb to T12 without significant tenderness. Strength 5/5 throughout. No abnormal scapular function observed. Sensation intact. Peripheral pulses intact.  Examination of the cervical spine was significant for no spinous process tenderness, some tenderness along the palpation of the left cervical musculature.  He did have pain with extension of the C-spine.  Location of the most intense pain is located on the left side at about level of C5-C6 just lateral to the transverse process over the rhomboid. Pain referred to a parascapular trigger point  Special Tests:   - Crossarm test: NEG - Jobe test: NEG   - Hawkins: NEG   - Neer test: NEG   - Gerber lift-off test: NEG   - Drop arm test: NEG   - Spurling's test: Positive  ASSESSMENT/PLAN:   Left shoulder pain Given patient has had no specific event or inciting injury or trauma is been slowly getting worse and his shoulder exam is completely normal with a positive Spurling's I am concerned  that this pain could be referred from his neck.  We will begin by ordering x-rays of the C-spine lateral and AP.  Given that he cannot take NSAIDs we prescribed him prednisone dose pack for 6 days along with Flexeril to use at night.  He will follow up in 4 weeks or sooner if not better.     Glen Alpha, DO PGY-4, Sports Medicine Fellow [place]  I observed and examined the patient with the [facility] resident and agree with assessment and plan.  Note reviewed and modified by me. Glen Mcgill, MD

## 2020-05-03 NOTE — Patient Instructions (Signed)
Pleasure to meet you today! I think your pain is referred from a pinched nerve in your neck. Please take the prednisone dose pack as directed. Take it with food and try not to take it at night. Once you feel better start doing the exercises as well. It is important for you to get x-rays of your neck to ensure we aren't missing anything else that could be a source of your pain. If they are abnormal I will call you!  Harolyn Rutherford, DO [facility]

## 2020-05-03 NOTE — Assessment & Plan Note (Signed)
Given patient has had no specific event or inciting injury or trauma is been slowly getting worse and his shoulder exam is completely normal with a positive Spurling's I am concerned that this pain could be referred from his neck.  We will begin by ordering x-rays of the C-spine lateral and AP.  Given that he cannot take NSAIDs we prescribed him prednisone dose pack for 6 days along with Flexeril to use at night.  He will follow up in 4 weeks or sooner if not better.

## 2020-05-05 ENCOUNTER — Encounter: Payer: Self-pay | Admitting: Podiatry

## 2020-05-09 ENCOUNTER — Ambulatory Visit: Payer: Self-pay | Admitting: Internal Medicine

## 2020-05-11 ENCOUNTER — Ambulatory Visit (INDEPENDENT_AMBULATORY_CARE_PROVIDER_SITE_OTHER): Payer: Self-pay | Admitting: Podiatry

## 2020-05-11 ENCOUNTER — Encounter: Payer: Self-pay | Admitting: Podiatry

## 2020-05-11 ENCOUNTER — Other Ambulatory Visit: Payer: Self-pay

## 2020-05-11 DIAGNOSIS — M722 Plantar fascial fibromatosis: Secondary | ICD-10-CM

## 2020-05-11 DIAGNOSIS — M21862 Other specified acquired deformities of left lower leg: Secondary | ICD-10-CM

## 2020-05-11 DIAGNOSIS — Z9889 Other specified postprocedural states: Secondary | ICD-10-CM

## 2020-05-11 DIAGNOSIS — M216X2 Other acquired deformities of left foot: Secondary | ICD-10-CM

## 2020-05-11 NOTE — Progress Notes (Signed)
Subjective:  Patient ID: Glen [name], male    DOB: 01/14/1966,  MRN: [id number]  Chief Complaint  Patient presents with  . Routine Post Op    Pt stated that he is doing well he has some soreness around the stich but not alot of pain. He stated that when he is not wearing the boot he is not able to apply pressure .     54 y.o. male returns for post-op check.  Patient is doing a lot better.  He states he does not have pain.  He has been ambulating with the boot on.  He denies any other acute complaints.  He has been ambulating with shoes on.  Review of Systems: Negative except as noted in the HPI. Denies N/V/F/Ch.  Past Medical History:  Diagnosis Date  . Allergic rhinitis   . Anxiety   . Asthmatic bronchitis   . Dry eyes   . GERD (gastroesophageal reflux disease)   . Hepatitis B infection 2008   recovered  . Hypothyroidism 2010   Dr. Wende Bushy  . Internal hemorrhoids   . Urethritis 2010    Current Outpatient Medications:  .  ALPRAZolam (XANAX) 0.25 MG tablet, Take 0.25 mg by mouth 2 (two) times daily as needed., Disp: , Rfl:  .  budesonide-formoterol (SYMBICORT) 160-4.5 MCG/ACT inhaler, Inhale 2 puffs into the lungs 2 (two) times daily., Disp: 2 Inhaler, Rfl: 0 .  chlorpheniramine (CHLOR-TRIMETON) 4 MG tablet, Take 2 tablets (8 mg total) by mouth 3 (three) times daily., Disp: 90 tablet, Rfl: 2 .  Cholecalciferol (VITAMIN D3) 2000 units capsule, Take 1 capsule (2,000 Units total) by mouth daily., Disp: 100 capsule, Rfl: 3 .  citalopram (CELEXA) 10 MG tablet, TAKE 1 TABLET BY MOUTH ONCE DAILY, Disp: 90 tablet, Rfl: 3 .  cyclobenzaprine (FLEXERIL) 10 MG tablet, Take 1 tablet (10 mg total) by mouth at bedtime as needed for muscle spasms., Disp: 30 tablet, Rfl: 0 .  doxycycline (VIBRA-TABS) 100 MG tablet, Take 1 tablet (100 mg total) by mouth 2 (two) times daily., Disp: 28 tablet, Rfl: 0 .  ergocalciferol (VITAMIN D2) 50000 units capsule, Take 1 capsule (50,000 Units total) by  mouth once a week., Disp: 4 capsule, Rfl: 0 .  fluticasone (FLONASE) 50 MCG/ACT nasal spray, Place 1 spray into both nostrils 2 (two) times daily., Disp: 16 g, Rfl: 2 .  Glycopyrrolate-Formoterol (BEVESPI AEROSPHERE) 9-4.8 MCG/ACT AERO, Inhale 2 Inhalers into the lungs 2 (two) times daily., Disp: 1 Inhaler, Rfl: 3 .  ketorolac (TORADOL) 10 MG tablet, Take 1 tablet (10 mg total) by mouth every 6 (six) hours as needed., Disp: 20 tablet, Rfl: 0 .  Linaclotide (LINZESS) 145 MCG CAPS capsule, Take 1 capsule (145 mcg total) by mouth 1 day or 1 dose., Disp: 90 capsule, Rfl: 3 .  omeprazole (PRILOSEC) 40 MG capsule, Take 1 capsule (40 mg total) by mouth daily., Disp: 30 capsule, Rfl: 3 .  predniSONE (DELTASONE) 10 MG tablet, Use as directed per doctors orders., Disp: 21 tablet, Rfl: 0 .  Probiotic Product (ALIGN) 4 MG CAPS, Take 1 capsule (4 mg total) by mouth daily., Disp: 30 capsule, Rfl: 0 .  propranolol (INDERAL) 20 MG tablet, Take 1-2 tablets (20-40 mg total) by mouth 2 (two) times daily as needed (palpitations)., Disp: 60 tablet, Rfl: 3 .  valACYclovir (VALTREX) 500 MG tablet, TAKE 1 TABLET BY MOUTH TWICE DAILY AS NEEDED FOR FEVER BLISTERS FOR 5 DAYS, Disp: 30 tablet, Rfl: 0 .  zolmitriptan (ZOMIG)  5 MG tablet, Take 1 tablet (5 mg total) by mouth as needed for migraine., Disp: 10 tablet, Rfl: 3 .  ZOMIG 5 MG nasal solution, USE ONE DOSE IN NOSTRIL AS NEEDED FOR MIGRAINE, Disp: 6 Units, Rfl: 3  Social History   Tobacco Use  Smoking Status Former Smoker  . Packs/day: 1.50  . Types: Cigarettes  . Quit date: 06/19/1999  . Years since quitting: 20.9  Smokeless Tobacco Never Used    Allergies  Allergen Reactions  . Codeine     ? Mental status changes  . Penicillins     Mother said don't take   Objective:  There were no vitals filed for this visit. There is no height or weight on file to calculate BMI. Constitutional Well developed. Well nourished.  Vascular Foot warm and well  perfused. Capillary refill normal to all digits.   Neurologic Normal speech. Oriented to person, place, and time. Epicritic sensation to light touch grossly present bilaterally.  Dermatologic  skin completely reepithelialized.  Greater than 10 of dorsiflexion noted to the ankle.  Orthopedic:  Mild tenderness to palpation noted about the surgical site.   Radiographs: None Assessment:   1. Plantar fasciitis, left   2. Gastrocnemius equinus, left   3. Status post foot surgery    Plan:  Patient was evaluated and treated and all questions answered.  S/p foot surgery left -Progressing as expected post-operatively. -XR: None -WB Status: Weightbearing as tolerated in regular shoes -Sutures: Removed.  No cellulitis is noted.  No clinical signs of infection noted. -Medications: None -I encouraged him to transition into weightbearing as tolerated in regular shoes with orthotics.  I will see him back again in 4 weeks for final follow-up if everything is good we will hold off on physical therapy.  No follow-ups on file.

## 2020-05-11 NOTE — Progress Notes (Signed)
Subjective:    CC: L shoulder pain  I, Glen [name], LAT, ATC, am serving as scribe for Dr. Lynne Leader.  HPI: Pt is a 54 y/o male presenting w/ c/o L scapular pain x approximately one month w/ no known MOI.  He locates his pain to his L superior angle of his scapula.  Of note, he was seen by Dr. Garlan Fillers and Dr. Oneida Alar on 05/03/20 and thought to have a cervical component to his symptoms.  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  Radiating pain: yes into his L UE intermittently does not extend beyond the level of the elbow. L shoulder mechanical symptoms: no Neck pain: No Aggravating factors: hairdressing; L cervical sidebending and occasionally cervical ext Treatments tried: heat, massage, Tylenol; prednisone dose pack; Tramadol  Diagnostic imaging: C-spine XR- 05/03/20  Pertinent review of Systems: No fevers or chills  Relevant historical information: Recent plantar fascial release   Objective:    Vitals:   05/16/20 0821  BP: 110/68  Pulse: 90  SpO2: 99%   General: Well Developed, well nourished, and in no acute distress.   MSK: C-spine normal-appearing nontender midline normal cervical motion positive left-sided Spurling's test. Tender palpation left trapezius and rhomboid especially at insertion onto medial scapula. Normal shoulder motion and strength.  Lab and Radiology Results  Procedure: Real-time Ultrasound Guided Injection of left rhomboid and insertion medial superior scapula Device: Philips Affiniti 50G Images permanently stored and available for review in PACS Verbal informed consent obtained.  Discussed risks and benefits of procedure. Warned about pneumothorax infection bleeding damage to structures skin hypopigmentation and fat atrophy among others. Patient expresses understanding and agreement Time-out conducted.   Noted no overlying erythema, induration, or other signs of local infection.   Skin prepped in a sterile  fashion.   Local anesthesia: Topical Ethyl chloride.   With sterile technique and under real time ultrasound guidance:  40 mg of Kenalog and 2 L of Marcaine injected into insertion site of rhomboid onto superior medial scapula. Fluid seen entering the insertion site.  Note needle was visualized during the entire portion.  At no point did it extend deep into the muscle layers.  Special care was taken to avoid deep injection.  Completed without difficulty   Pain immediately resolved suggesting accurate placement of the medication.   Advised to call if fevers/chills, erythema, induration, drainage, or persistent bleeding.   Images permanently stored and available for review in the ultrasound unit.  Impression: Technically successful ultrasound guided injection.  DG Cervical Spine Complete  Result Date: 05/04/2020 CLINICAL DATA:  Cervicalgia EXAM: CERVICAL SPINE-AP, NEUTRAL LATERAL, FLEXION LATERAL, EXTENSION LATERAL IMAGES COMPARISON:  None. FINDINGS: Frontal, neutral lateral, flexion lateral, extension lateral images were obtained. There is no fracture. There is no spondylolisthesis on neutral lateral imaging. There is no appreciable change in lateral alignment between neutral lateral, flexion lateral, and extension lateral imaging. Prevertebral soft tissues and predental space regions are normal. There is moderately severe disc space narrowing at C5-6, C6-7, and C7-T1. There are anterior osteophytes at C5, C6, and C7. No erosion. There is relative lack of lordosis on neutral lateral imaging. Lung apices are clear. IMPRESSION: Relative lack of lordosis on neutral lateral imaging may be indicative of a degree of muscle spasm. There is no fracture. There is no spondylolisthesis on neutral lateral imaging. There is no appreciable change in lateral alignment between neutral lateral, flexion lateral, and extension lateral imaging. 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 Electronically Signed   By: Lowella Grip III M.D.   On: 05/04/2020 15:05    I, Lynne Leader, personally (independently) visualized and performed the interpretation of the images attached in this note.   Impression and Recommendations:    Assessment and Plan: 54 y.o. male with left trapezius and rhomboid pain..  Multifactorial.  Patient certainly does have some spasm and dysfunction of his periscapular musculature including rhomboid and trapezius etc.  He also likely has some referred pain from cervical DDD.  He may even have some cervical radiculopathy at C6 or C7 with incomplete distribution of symptoms more distally.  Discussed options.  He is already tried good conservative management with home exercise program and medications for about 4 weeks.  Next step would be referral to physical therapy.  We will proceed with trigger point injection as above.  We will try switching to tizanidine as he may tolerate a little bit better and adding TENS unit and heating pad.  Check back if not improving.  Of note special discussion regarding risks of the above injection including special risk for pneumothorax.  PDMP not reviewed this encounter. Orders Placed This Encounter  Procedures  . [place] LIMITED JOINT SPACE STRUCTURES UP LEFT(NO LINKED CHARGES)    Order Specific Question:   Reason for Exam (SYMPTOM  OR DIAGNOSIS REQUIRED)    Answer:   L scapular pain    Order Specific Question:   Preferred imaging location?    Answer:   [place]  . Ambulatory referral to Physical Therapy    Referral Priority:   Routine    Referral Type:   Physical Medicine    Referral Reason:   Specialty Services Required    Requested Specialty:   Physical Therapy   Meds ordered this encounter  Medications  . tiZANidine (ZANAFLEX) 4 MG tablet    Sig: Take 1 tablet (4 mg total) by mouth every 6 (six) hours as needed for muscle spasms.    Dispense:  30 tablet    Refill:  1     Discussed warning signs or symptoms. Please see discharge instructions. Patient expresses understanding.   The above documentation has been reviewed and is accurate and complete Lynne Leader, M.D.

## 2020-05-13 ENCOUNTER — Other Ambulatory Visit: Payer: Self-pay | Admitting: Internal Medicine

## 2020-05-16 ENCOUNTER — Encounter: Payer: Self-pay | Admitting: Family Medicine

## 2020-05-16 ENCOUNTER — Other Ambulatory Visit: Payer: Self-pay

## 2020-05-16 ENCOUNTER — Ambulatory Visit: Payer: Self-pay

## 2020-05-16 ENCOUNTER — Ambulatory Visit (INDEPENDENT_AMBULATORY_CARE_PROVIDER_SITE_OTHER): Payer: Self-pay | Admitting: Family Medicine

## 2020-05-16 VITALS — BP 110/68 | HR 90 | Ht 74.0 in | Wt 212.8 lb

## 2020-05-16 DIAGNOSIS — M898X1 Other specified disorders of bone, shoulder: Secondary | ICD-10-CM

## 2020-05-16 MED ORDER — TIZANIDINE HCL 4 MG PO TABS: 4.0000 mg | ORAL_TABLET | Freq: Four times a day (QID) | ORAL | 1 refills | Status: DC | PRN

## 2020-05-16 NOTE — Patient Instructions (Addendum)
Thank you for coming in today.  Call or go to the ER if you develop redness or pain or shortness of breath or chest pain.   Use heating pad and TENS unit.   Thermcare wraps are good on the go.   I've referred you to Physical Therapy.  Let us know if you don't hear from them in one week.  TENS UNIT: This is helpful for muscle pain and spasm.   Search and Purchase a TENS 7000 2nd edition at  www.tenspros.com or www.[place].com It should be less than $30.     TENS unit instructions: Do not shower or bathe with the unit on . Turn the unit off before removing electrodes or batteries . If the electrodes lose stickiness add a drop of water to the electrodes after they are disconnected from the unit and place on plastic sheet. If you continued to have difficulty, call the TENS unit company to purchase more electrodes. . Do not apply lotion on the skin area prior to use. Make sure the skin is clean and dry as this will help prolong the life of the electrodes. . After use, always check skin for unusual red areas, rash or other skin difficulties. If there are any skin problems, does not apply electrodes to the same area. . Never remove the electrodes from the unit by pulling the wires. . Do not use the TENS unit or electrodes other than as directed. . Do not change electrode placement without consultating your therapist or physician. Marland Kitchen Keep 2 fingers with between each electrode. . Wear time ratio is 2:1, on to off times.    For example on for 30 minutes off for 15 minutes and then on for 30 minutes off for 15 minutes

## 2020-05-17 ENCOUNTER — Other Ambulatory Visit: Payer: Self-pay | Admitting: Sports Medicine

## 2020-05-18 ENCOUNTER — Other Ambulatory Visit: Payer: Self-pay

## 2020-05-18 ENCOUNTER — Encounter: Payer: Self-pay | Admitting: Internal Medicine

## 2020-05-18 ENCOUNTER — Ambulatory Visit (INDEPENDENT_AMBULATORY_CARE_PROVIDER_SITE_OTHER): Payer: Self-pay | Admitting: Internal Medicine

## 2020-05-18 VITALS — BP 138/80 | HR 89 | Temp 99.6°F | Wt 209.2 lb

## 2020-05-18 DIAGNOSIS — T753XXD Motion sickness, subsequent encounter: Secondary | ICD-10-CM

## 2020-05-18 DIAGNOSIS — T753XXA Motion sickness, initial encounter: Secondary | ICD-10-CM | POA: Insufficient documentation

## 2020-05-18 DIAGNOSIS — M25512 Pain in left shoulder: Secondary | ICD-10-CM

## 2020-05-18 DIAGNOSIS — M722 Plantar fascial fibromatosis: Secondary | ICD-10-CM

## 2020-05-18 DIAGNOSIS — F419 Anxiety disorder, unspecified: Secondary | ICD-10-CM

## 2020-05-18 MED ORDER — METHYLPREDNISOLONE 4 MG PO TBPK: ORAL_TABLET | ORAL | 0 refills | Status: DC

## 2020-05-18 MED ORDER — TRAMADOL HCL 50 MG PO TABS
50.0000 mg | ORAL_TABLET | Freq: Three times a day (TID) | ORAL | 1 refills | Status: DC | PRN
Start: 2020-05-18 — End: 2020-08-08

## 2020-05-18 MED ORDER — METHYLPREDNISOLONE ACETATE 80 MG/ML IJ SUSP
60.0000 mg | Freq: Once | INTRAMUSCULAR | Status: AC
  Administered 2020-05-18: 60 mg via INTRAMUSCULAR

## 2020-05-18 MED ORDER — LIDOCAINE-EPINEPHRINE 2 %-1:100000 IJ SOLN
5.0000 mL | Freq: Once | INTRAMUSCULAR | Status: AC
  Administered 2020-05-18: 5 mL

## 2020-05-18 NOTE — Assessment & Plan Note (Signed)
S/p release surgery

## 2020-05-18 NOTE — Assessment & Plan Note (Signed)
Probable - w/nausea Scop patch, motion sickness band options were discussed

## 2020-05-18 NOTE — Assessment & Plan Note (Signed)
Rhomboid strain Repeat trigger point inj Medrol pack if worse Rice sock heating pad, tennis ball massage Using TENs

## 2020-05-18 NOTE — Assessment & Plan Note (Signed)
On Celexa 

## 2020-05-18 NOTE — Patient Instructions (Signed)
Rice sock heating pad 

## 2020-05-18 NOTE — Progress Notes (Signed)
Subjective:  Patient ID: Glen [name], male    DOB: 10-06-1965  Age: 54 y.o. MRN: [id number]  CC: Shoulder Pain (LEFT)   HPI Glen [name] presents for posterior L shoulder pain x 4 wks - progressed to severe, feels muscular 10/10 at times. He can't sleep. He had one trigger point inj on 11/29 - it did not help.... C/o nausea when flying  Outpatient Medications Prior to Visit  Medication Sig Dispense Refill  . Cholecalciferol (VITAMIN D3) 2000 units capsule Take 1 capsule (2,000 Units total) by mouth daily. 100 capsule 3  . citalopram (CELEXA) 10 MG tablet Take 1 tablet by mouth once daily 90 tablet 0  . omeprazole (PRILOSEC) 40 MG capsule Take 1 capsule (40 mg total) by mouth daily. 30 capsule 3  . valACYclovir (VALTREX) 500 MG tablet TAKE 1 TABLET BY MOUTH TWICE DAILY AS NEEDED FOR FEVER BLISTERS FOR 5 DAYS 30 tablet 0  . ZOMIG 5 MG nasal solution USE ONE DOSE IN NOSTRIL AS NEEDED FOR MIGRAINE 6 Units 3  . propranolol (INDERAL) 20 MG tablet Take 1-2 tablets (20-40 mg total) by mouth 2 (two) times daily as needed (palpitations). (Patient not taking: Reported on 05/18/2020) 60 tablet 3  . tiZANidine (ZANAFLEX) 4 MG tablet Take 1 tablet (4 mg total) by mouth every 6 (six) hours as needed for muscle spasms. (Patient not taking: Reported on 05/18/2020) 30 tablet 1  . ALPRAZolam (XANAX) 0.25 MG tablet Take 0.25 mg by mouth 2 (two) times daily as needed.    . budesonide-formoterol (SYMBICORT) 160-4.5 MCG/ACT inhaler Inhale 2 puffs into the lungs 2 (two) times daily. (Patient not taking: Reported on 05/18/2020) 2 Inhaler 0  . chlorpheniramine (CHLOR-TRIMETON) 4 MG tablet Take 2 tablets (8 mg total) by mouth 3 (three) times daily. (Patient not taking: Reported on 05/18/2020) 90 tablet 2  . ergocalciferol (VITAMIN D2) 50000 units capsule Take 1 capsule (50,000 Units total) by mouth once a week. (Patient not taking: Reported on 05/18/2020) 4 capsule 0  . fluticasone (FLONASE) 50 MCG/ACT nasal spray  Place 1 spray into both nostrils 2 (two) times daily. (Patient not taking: Reported on 05/18/2020) 16 g 2  . Glycopyrrolate-Formoterol (BEVESPI AEROSPHERE) 9-4.8 MCG/ACT AERO Inhale 2 Inhalers into the lungs 2 (two) times daily. (Patient not taking: Reported on 05/18/2020) 1 Inhaler 3  . ketorolac (TORADOL) 10 MG tablet Take 1 tablet (10 mg total) by mouth every 6 (six) hours as needed. (Patient not taking: Reported on 05/18/2020) 20 tablet 0  . Linaclotide (LINZESS) 145 MCG CAPS capsule Take 1 capsule (145 mcg total) by mouth 1 day or 1 dose. (Patient not taking: Reported on 05/18/2020) 90 capsule 3  . Probiotic Product (ALIGN) 4 MG CAPS Take 1 capsule (4 mg total) by mouth daily. (Patient not taking: Reported on 05/18/2020) 30 capsule 0  . zolmitriptan (ZOMIG) 5 MG tablet Take 1 tablet (5 mg total) by mouth as needed for migraine. (Patient not taking: Reported on 05/18/2020) 10 tablet 3   No facility-administered medications prior to visit.    ROS: Review of Systems  Constitutional: Negative for appetite change, fatigue and unexpected weight change.  HENT: Negative for congestion, nosebleeds, sneezing, sore throat and trouble swallowing.   Eyes: Negative for itching and visual disturbance.  Respiratory: Negative for cough.   Cardiovascular: Negative for chest pain, palpitations and leg swelling.  Gastrointestinal: Negative for abdominal distention, blood in stool, diarrhea and nausea.  Genitourinary: Negative for frequency and hematuria.  Musculoskeletal: Positive  for arthralgias and neck stiffness. Negative for back pain, gait problem, joint swelling and neck pain.  Skin: Negative for rash.  Neurological: Negative for dizziness, tremors, speech difficulty and weakness.  Psychiatric/Behavioral: Negative for agitation, dysphoric mood and sleep disturbance. The patient is not nervous/anxious.     Objective:  BP 138/80 (BP Location: Left Arm)   Pulse 89   Temp 99.6 F (37.6 C) (Oral)   Wt 209  lb 3.2 oz (94.9 kg)   SpO2 97%   BMI 26.86 kg/m   BP Readings from Last 3 Encounters:  05/18/20 138/80  05/16/20 110/68  05/03/20 128/80    Wt Readings from Last 3 Encounters:  05/18/20 209 lb 3.2 oz (94.9 kg)  05/16/20 212 lb 12.8 oz (96.5 kg)  05/03/20 200 lb (90.7 kg)    Physical Exam Constitutional:      General: He is not in acute distress.    Appearance: He is well-developed. He is not ill-appearing.     Comments: NAD  Eyes:     Conjunctiva/sclera: Conjunctivae normal.     Pupils: Pupils are equal, round, and reactive to light.  Neck:     Thyroid: No thyromegaly.     Vascular: No JVD.  Cardiovascular:     Rate and Rhythm: Normal rate and regular rhythm.     Heart sounds: Normal heart sounds. No murmur heard.  No friction rub. No gallop.   Pulmonary:     Effort: Pulmonary effort is normal. No respiratory distress.     Breath sounds: Normal breath sounds. No wheezing or rales.  Chest:     Chest wall: No tenderness.  Abdominal:     General: Bowel sounds are normal. There is no distension.     Palpations: Abdomen is soft. There is no mass.     Tenderness: There is no abdominal tenderness. There is no guarding or rebound.  Musculoskeletal:        General: Tenderness present. Normal range of motion.     Cervical back: Normal range of motion.  Lymphadenopathy:     Cervical: No cervical adenopathy.  Skin:    General: Skin is warm and dry.     Findings: No rash.  Neurological:     Mental Status: He is alert and oriented to person, place, and time.     Cranial Nerves: No cranial nerve deficit.     Motor: No abnormal muscle tone.     Coordination: Coordination normal.     Gait: Gait normal.     Deep Tendon Reflexes: Reflexes are normal and symmetric.  Psychiatric:        Behavior: Behavior normal.        Thought Content: Thought content normal.        Judgment: Judgment normal.        Procedure Note :    Trigger Point Injection:   Indication : Focal  tender area identifiable by the location without other identifiable neurologic or musculoskeletal finding or pathology.   Risks including unsuccessful procedure , bleeding, infection, bruising, skin atrophy and others were explained to the patient in detail as well as the benefits. Informed consent was obtained and signed.   Tthe patient was placed in a comfortable position.  3  points of maximum tenderness over L rhomboid and trapezius muscles were marked and  the skin was prepped with Betadine and alcohol. 1 inch 25-gauge needle was used. The needle was advanced perpendicular to the skin. Each trigger point was injected with 1 mL  of 2% lidocaine and 20 mg of Depo-Medrol in a usual fashion.  Band-Aids applied.   Tolerated well. Complications: None. Good pain relief following the procedure.   Lab Results  Component Value Date   WBC 5.1 03/02/2019   HGB 14.8 03/02/2019   HCT 42.0 03/02/2019   PLT 227.0 03/02/2019   GLUCOSE 96 03/02/2019   CHOL 197 03/02/2019   TRIG 41.0 03/02/2019   HDL 68.40 03/02/2019   LDLDIRECT 150.6 06/29/2013   LDLCALC 121 (H) 03/02/2019   ALT 20 03/02/2019   AST 23 03/02/2019   NA 138 03/02/2019   K 4.0 03/02/2019   CL 101 03/02/2019   CREATININE 0.99 03/02/2019   BUN 19 03/02/2019   CO2 30 03/02/2019   TSH 1.35 03/02/2019   PSA 0.90 03/02/2019   HGBA1C 5.3 12/02/2009    DG Cervical Spine Complete  Result Date: 05/04/2020 CLINICAL DATA:  Cervicalgia EXAM: CERVICAL SPINE-AP, NEUTRAL LATERAL, FLEXION LATERAL, EXTENSION LATERAL IMAGES COMPARISON:  None. FINDINGS: Frontal, neutral lateral, flexion lateral, extension lateral images were obtained. There is no fracture. There is no spondylolisthesis on neutral lateral imaging. There is no appreciable change in lateral alignment between neutral lateral, flexion lateral, and extension lateral imaging. Prevertebral soft tissues and predental space regions are normal. There is moderately severe disc space narrowing at  C5-6, C6-7, and C7-T1. There are anterior osteophytes at C5, C6, and C7. No erosion. There is relative lack of lordosis on neutral lateral imaging. Lung apices are clear. IMPRESSION: Relative lack of lordosis on neutral lateral imaging may be indicative of a degree of muscle spasm. There is no fracture. There is no spondylolisthesis on neutral lateral imaging. There is no appreciable change in lateral alignment between neutral lateral, flexion lateral, and extension lateral imaging. There is disc space narrowing consistent with osteoarthritic change at C5-6, C6-7, and C7-T1. Electronically Signed   By: Lowella Grip III M.D.   On: 05/04/2020 15:05    Assessment & Plan:    Walker Kehr, MD

## 2020-05-23 ENCOUNTER — Ambulatory Visit (INDEPENDENT_AMBULATORY_CARE_PROVIDER_SITE_OTHER): Payer: Self-pay | Admitting: Rehabilitative and Restorative Service Providers"

## 2020-05-23 ENCOUNTER — Other Ambulatory Visit: Payer: Self-pay

## 2020-05-23 DIAGNOSIS — M546 Pain in thoracic spine: Secondary | ICD-10-CM

## 2020-05-23 DIAGNOSIS — M25512 Pain in left shoulder: Secondary | ICD-10-CM

## 2020-05-23 DIAGNOSIS — R293 Abnormal posture: Secondary | ICD-10-CM

## 2020-05-23 DIAGNOSIS — G8929 Other chronic pain: Secondary | ICD-10-CM

## 2020-05-23 DIAGNOSIS — M5412 Radiculopathy, cervical region: Secondary | ICD-10-CM

## 2020-05-23 DIAGNOSIS — M542 Cervicalgia: Secondary | ICD-10-CM

## 2020-05-23 NOTE — Therapy (Signed)
[facility] [address] [place], [place], [address] Phone: [phone]   Fax:  [phone]  Physical Therapy Evaluation  Patient Details  Name: Glen [name] MRN: [id number] Date of Birth: 11/06/1965 Referring Provider (PT): Dr. Lynne Leader   Encounter Date: 05/23/2020   PT End of Session - 05/23/20 0931    Visit Number 1    Number of Visits 12    Date for PT Re-Evaluation 07/18/20    PT Start Time 0933    PT Stop Time 1011    PT Time Calculation (min) 38 min    Activity Tolerance Patient tolerated treatment well    Behavior During Therapy [facility] for tasks assessed/performed           Past Medical History:  Diagnosis Date  . Allergic rhinitis   . Anxiety   . Asthmatic bronchitis   . Dry eyes   . GERD (gastroesophageal reflux disease)   . Hepatitis B infection 2008   recovered  . Hypothyroidism 2010   Dr. Wende Bushy  . Internal hemorrhoids   . Urethritis 2010    Past Surgical History:  Procedure Laterality Date  . FACIAL COSMETIC SURGERY    . LIPOSUCTION    . TEAR DUCT PROBING  2009    There were no vitals filed for this visit.    Subjective Assessment - 05/23/20 0935    Subjective Pt. has complaints of posterior shoulder/scapula symptoms as well as some soreness into Lt upper arm.  Pt. stated pain can be severe at times.  Injection was performed last week with improvement noted for several days as well as medication.  Insidious onset about 1-2 months ago.  Works as Estate agent c constant arm use.    Patient Stated Goals Reduce pain, be able to perform work    Currently in Pain? Yes    Pain Score 7    pain at worst 10/10   Pain Location Shoulder    Pain Orientation Posterior;Left    Pain Descriptors / Indicators Constant;Tightness    Pain Type Chronic pain    Pain Onset More than a month ago    Pain Frequency Constant    Aggravating Factors  difficulty sleeping, work as hairdresser    Pain Relieving Factors injection,  TENS unit, medication              [facility] PT Assessment - 05/23/20 0001      Assessment   Medical Diagnosis Lt shoulder pain    Referring Provider (PT) Dr. 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)    Leisure Journalist, newspaper   Overall Cognitive Status Within Functional Limits for tasks assessed      Observation/Other Assessments   Focus on Therapeutic Outcomes (FOTO)  Intake 67 % , outcome predicted 73%      Sensation   Light Touch Appears Intact      Posture/Postural Control   Posture Comments Increased thoracic kyphosis, rounded shoulders, FHP  ROM / Strength   AROM / PROM / Strength AROM;PROM;Strength      AROM   Overall AROM Comments Shoulder AROM bilateral WFL s symptoms    AROM Assessment Site Shoulder;Cervical    Right/Left Shoulder Left;Right    Cervical Flexion 70   Posterior shoulder/thoracic pain   Cervical Extension 62   Lt posterior shoulder/thoracic pain   Cervical - Right Rotation 75    Cervical - Left Rotation 70      Strength   Strength Assessment Site Shoulder;Elbow    Right/Left Shoulder Left;Right    Right Shoulder Flexion 5/5    Right Shoulder ABduction 5/5    Right Shoulder Internal Rotation 5/5    Right Shoulder External Rotation 5/5    Left Shoulder Flexion 5/5    Left Shoulder ABduction 5/5    Left Shoulder Internal Rotation 5/5    Left Shoulder External Rotation 5/5    Right/Left Elbow Left;Right    Right Elbow Flexion 5/5    Right Elbow Extension 5/5    Left Elbow Flexion 5/5    Left Elbow Extension 5/5      Palpation   SI assessment  Jt  restriction and concordant symptoms noted c cPA T4-T6, Lt uPA T3-T7    Palpation comment TrP noted in Lt upper trap, levator scap, infraspinatus, Lt mid thoracic paraspinals      Special Tests   Other special tests (-) Spurlings, Distraction testing                      Objective measurements completed on examination: See above findings.       [facility] Adult PT Treatment/Exercise - 05/23/20 0001      Exercises   Exercises Shoulder;Other Exercises    Other Exercises  HEP instruction/performance c trial of 1 set of each exercise on handout (upper trap stretch, levator stretch Lt, barrel hug/scap retraction)      Manual Therapy   Manual therapy comments compression to Lt upper trap, infraspinatus, thoracic paraspinals, cPA, Lt uPA g3 mid thoracic            Trigger Point Dry Needling - 05/23/20 0001    Consent Given? Yes    Education Handout Provided Yes    Muscles Treated Head and Neck Upper trapezius    Muscles Treated Back/Hip Thoracic multifidi    Upper Trapezius Response Twitch reponse elicited    Thoracic multifidi response Twitch response elicited                PT Education - 05/23/20 0930    Education Details HEP, POC    Person(s) Educated Patient    Methods Explanation;Demonstration;Verbal cues;Handout    Comprehension Returned demonstration;Verbalized understanding            PT Short Term Goals - 05/23/20 0931      PT SHORT TERM GOAL #1   Title Patient will demonstrate independent use of home exercise program to maintain progress from in clinic treatments.    Time 3    Period Weeks    Status New    Target Date 06/13/20             PT Long Term Goals - 05/23/20 0931      PT LONG TERM GOAL #1   Title Patient will demonstrate/report pain at worst less than or equal to 2/10 to facilitate minimal limitation in daily activity secondary to pain symptoms.    Time 8    Period Weeks  Status New    Target Date 07/18/20      PT LONG  TERM GOAL #2   Title Patient will demonstrate independent use of home exercise program to facilitate ability to maintain/progress functional gains from skilled physical therapy services.    Time 8    Period Weeks    Status New    Target Date 07/18/20      PT LONG TERM GOAL #3   Title Pt. will demonstrate cervical and thoracic movement at PLOF s limitation 100 % WFL to facilitate usual daily and work related activity.    Time 8    Period Weeks    Status New    Target Date 07/18/20      PT LONG TERM GOAL #4   Title Pt. will demonstrate /report ability to sleep s restriction due to symptoms.    Time 8    Period Weeks    Status New    Target Date 07/18/20                  Plan - 05/23/20 0932    Clinical Impression Statement Patient is a 54 y.o. male who comes to clinic with complaints of cervical/posterior scapular pain with mobility deficits that impair their ability to perform usual daily and recreational functional activities without increase difficulty/symptoms at this time.  Patient to benefit from skilled PT services to address impairments and limitations to improve to previous level of function without restriction secondary to condition.    Examination-Activity Limitations Sit;Sleep;Carry;Reach Overhead;Lift    Examination-Participation Restrictions Occupation;Community Activity;Driving    Stability/Clinical Decision Making Stable/Uncomplicated    Clinical Decision Making Low    Rehab Potential Good    PT Frequency --   1-2x/week   PT Duration 8 weeks    PT Treatment/Interventions ADLs/Self Care Home Management;Cryotherapy;Electrical Stimulation;Iontophoresis 4mg /ml Dexamethasone;Moist Heat;Traction;Ultrasound;Neuromuscular re-education;Stair training;Functional mobility training;Therapeutic activities;Therapeutic exercise;Balance training;Manual techniques;Joint Manipulations;Spinal Manipulations;Passive range of motion;Dry needling    PT Next Visit Plan Reassess DN for  upper trap, infraspinatus, thoracic multifidi.  Improve thoracic mobility.    PT Home Exercise Plan [email or web address]    Consulted and Agree with Plan of Care Patient           Patient will benefit from skilled therapeutic intervention in order to improve the following deficits and impairments:  Decreased endurance, Hypomobility, Decreased activity tolerance, Pain, Impaired UE functional use, Decreased strength, Increased fascial restricitons, Increased muscle spasms, Decreased mobility, Decreased balance, Decreased range of motion, Improper body mechanics, Impaired perceived functional ability, Postural dysfunction, Impaired flexibility  Visit Diagnosis: Cervicalgia  Pain in thoracic spine  Chronic left shoulder pain  Abnormal posture     Problem List Patient Active Problem List   Diagnosis Date Noted  . Motion sickness 05/18/2020  . Shoulder pain, left 05/03/2020  . Plantar fasciitis of left foot 01/18/2020  . 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 Dyslipidemia 03/09/2019  . Otitis media 08/04/2018  . Acute sinusitis 08/04/2018  . Anal fissure 01/13/2018  . Shoulder pain, right 01/13/2018  . Ankle edema 11/25/2017  . Trochanteric bursitis of left hip 11/25/2017  . Cough 08/24/2016  . Wheezing 08/24/2016  . Inflammatory arthritis 07/13/2016  . Primary osteoarthritis of both hands 07/13/2016  . Primary osteoarthritis of both feet 07/13/2016  . Spondylosis of lumbar region without myelopathy or radiculopathy 07/13/2016  . Trochanteric bursitis of both hips 07/13/2016  . History of hepatitis B 07/13/2016  . Acute bronchitis 07/06/2016  . Constipation 07/12/2015  . Vitamin D deficiency 07/12/2015  .  Pain of both heels 11/03/2013  . Arthritis pain, hand 11/17/2012  . Mouth sores 11/17/2012  . Blood pressure elevated without history of HTN 07/07/2012  . Palpitations 04/07/2012  . Patellar tendonitis, right 11/07/2011  . Well adult exam 03/05/2011  . ABDOMINAL PAIN, GENERALIZED  06/13/2010  . HEMORRHOIDS 05/22/2010  . RECTAL PAIN 05/22/2010  . HEMATOCHEZIA 05/22/2010  . HYPOGLYCEMIA 12/01/2009  . HYPOGONADISM 12/01/2009  . LOW BACK PAIN, ACUTE 12/01/2009  . HERPES SIMPLEX WITHOUT MENTION OF COMPLICATION 24/23/5361  . PANIC DISORDER 11/24/2009  . DEPRESSIVE DISORDER 11/24/2009  . GERD 09/13/2009  . PES PLANUS 03/16/2009  . REACTIVE AIRWAY DISEASE 03/09/2009  . SNORING 03/09/2009  . Anxiety disorder 02/24/2009  . HYPOTHYROIDISM 10/25/2008  . BACK PAIN 08/23/2008  . Pain in joint, lower leg 12/29/2007  . MRSA INFECTION 01/21/2007  . HEPATITIS B 01/21/2007  . ALLERGIC RHINITIS 01/21/2007  . ABDOMINAL WALL HERNIA 01/21/2007   Scot Jun, PT, DPT, OCS, ATC 05/23/20  10:40 AM    [facility] [address] [place], [place], [address] Phone: [phone]   Fax:  [phone]  Name: Glen [name] MRN: [id number] Date of Birth: May 23, 1966

## 2020-05-23 NOTE — Patient Instructions (Signed)
Access Code: [email or web address] URL: [email or web address] Date: 05/23/2020 Prepared by: Scot Jun  Exercises Seated Upper Trapezius Stretch - 2 x daily - 7 x weekly - 1 sets - 5 reps - 15 hold Seated Levator Scapulae Stretch - 2 x daily - 7 x weekly - 1 sets - 5 reps - 15 hold Seated 55 Gallon Barrel Hug Stretch - 2 x daily - 7 x weekly - 2 sets - 10 reps - 2 hold Seated Scapular Retraction - 2 x daily - 7 x weekly - 2 sets - 10 reps - 2 hold

## 2020-05-23 NOTE — Progress Notes (Signed)
Per pt's MyChart messages - he would like to proceed with MRI of his cervical spine with the plan to get a cervical ESI if warranted. He is now c/o numbness and tingling in this left arm and worsening pain.   MRI order placed. Pt will call [facility] to schedule.

## 2020-05-30 ENCOUNTER — Ambulatory Visit (INDEPENDENT_AMBULATORY_CARE_PROVIDER_SITE_OTHER): Payer: Self-pay

## 2020-05-30 ENCOUNTER — Ambulatory Visit: Payer: Self-pay | Admitting: Physical Therapy

## 2020-05-30 ENCOUNTER — Other Ambulatory Visit: Payer: Self-pay

## 2020-05-30 ENCOUNTER — Encounter: Payer: Self-pay | Admitting: Physical Therapy

## 2020-05-30 DIAGNOSIS — M47812 Spondylosis without myelopathy or radiculopathy, cervical region: Secondary | ICD-10-CM

## 2020-05-30 DIAGNOSIS — M542 Cervicalgia: Secondary | ICD-10-CM

## 2020-05-30 DIAGNOSIS — R293 Abnormal posture: Secondary | ICD-10-CM

## 2020-05-30 DIAGNOSIS — M4802 Spinal stenosis, cervical region: Secondary | ICD-10-CM

## 2020-05-30 DIAGNOSIS — M546 Pain in thoracic spine: Secondary | ICD-10-CM

## 2020-05-30 DIAGNOSIS — M5412 Radiculopathy, cervical region: Secondary | ICD-10-CM

## 2020-05-30 DIAGNOSIS — M25512 Pain in left shoulder: Secondary | ICD-10-CM

## 2020-05-30 DIAGNOSIS — G8929 Other chronic pain: Secondary | ICD-10-CM

## 2020-05-30 NOTE — Therapy (Signed)
[facility] Physical Therapy [address] [place], [place], [address] Phone: [phone]   Fax:  [phone]  Physical Therapy Treatment  Patient Details  Name: Glen [name] MRN: [id number] Date of Birth: 10-25-1965 Referring Provider (PT): Dr. Lynne Leader   Encounter Date: 05/30/2020   PT End of Session - 05/30/20 0924    Visit Number 2    Number of Visits 12    Date for PT Re-Evaluation 07/18/20    PT Start Time 0845    PT Stop Time 0918    PT Time Calculation (min) 33 min    Activity Tolerance Patient tolerated treatment well    Behavior During Therapy [facility] for tasks assessed/performed           Past Medical History:  Diagnosis Date  . Allergic rhinitis   . Anxiety   . Asthmatic bronchitis   . Dry eyes   . GERD (gastroesophageal reflux disease)   . Hepatitis B infection 2008   recovered  . Hypothyroidism 2010   Dr. Wende Bushy  . Internal hemorrhoids   . Urethritis 2010    Past Surgical History:  Procedure Laterality Date  . FACIAL COSMETIC SURGERY    . LIPOSUCTION    . TEAR DUCT PROBING  2009    There were no vitals filed for this visit.   Subjective Assessment - 05/30/20 0848    Subjective neck is hurting today, rated a 7/10.  felt the DN was helpful where he was needled.    Patient Stated Goals Reduce pain, be able to perform work    Currently in Pain? Yes    Pain Score 7     Pain Location Shoulder    Pain Orientation Left;Posterior    Pain Descriptors / Indicators Constant;Tightness    Pain Type Chronic pain    Pain Onset More than a month ago    Pain Frequency Constant    Aggravating Factors  difficulty sleeping, working as hairdresser    Pain Relieving Factors injection, TENS unit, meds                             [facility] Adult PT Treatment/Exercise - 05/30/20 0850      Exercises   Other Exercises  reveiewed HEP and discussed modifications to work set up and frequent position changes - pt verbalized  understanding      Shoulder Exercises: ROM/Strengthening   UBE (Upper Arm Bike) L3 x 6 min (3' each direction)      Manual Therapy   Manual therapy comments compression to Lt upper trap, levator scapula and rhomboids            Trigger Point Dry Needling - 05/30/20 0924    Consent Given? Yes    Education Handout Provided Previously provided    Muscles Treated Head and Neck Upper trapezius;Levator scapulae    Muscles Treated Upper Quadrant Rhomboids    Upper Trapezius Response Twitch reponse elicited    Levator Scapulae Response Twitch response elicited    Rhomboids Response Twitch response elicited                  PT Short Term Goals - 05/30/20 0925      PT SHORT TERM GOAL #1   Title Patient will demonstrate independent use of home exercise program to maintain progress from in clinic treatments.    Time 3    Period Weeks    Status Achieved  Target Date 06/13/20             PT Long Term Goals - 05/30/20 0925      PT LONG TERM GOAL #1   Title Patient will demonstrate/report pain at worst less than or equal to 2/10 to facilitate minimal limitation in daily activity secondary to pain symptoms.    Time 8    Period Weeks    Status On-going    Target Date 07/18/20      PT LONG TERM GOAL #2   Title Patient will demonstrate independent use of home exercise program to facilitate ability to maintain/progress functional gains from skilled physical therapy services.    Time 8    Period Weeks    Status On-going      PT LONG TERM GOAL #3   Title Pt. will demonstrate cervical and thoracic movement at PLOF s limitation 100 % WFL to facilitate usual daily and work related activity.    Time 8    Period Weeks    Status On-going      PT LONG TERM GOAL #4   Title Pt. will demonstrate /report ability to sleep s restriction due to symptoms.    Time 8    Period Weeks    Status On-going                 Plan - 05/30/20 0925    Clinical Impression Statement Pt  reported no pain following DN and manual therapy today and overall positive response to DN today and prior session. Will continue to benefit from PT to maximize function.    Examination-Activity Limitations Sit;Sleep;Carry;Reach Overhead;Lift    Examination-Participation Restrictions Occupation;Community Activity;Driving    Stability/Clinical Decision Making Stable/Uncomplicated    Rehab Potential Good    PT Frequency --   1-2x/week   PT Duration 8 weeks    PT Treatment/Interventions ADLs/Self Care Home Management;Cryotherapy;Electrical Stimulation;Iontophoresis 4mg /ml Dexamethasone;Moist Heat;Traction;Ultrasound;Neuromuscular re-education;Stair training;Functional mobility training;Therapeutic activities;Therapeutic exercise;Balance training;Manual techniques;Joint Manipulations;Spinal Manipulations;Passive range of motion;Dry needling    PT Next Visit Plan Reassess DN for upper trap, rhomboid, levator.  Improve thoracic mobility.    PT Home Exercise Plan [id number]    Consulted and Agree with Plan of Care Patient           Patient will benefit from skilled therapeutic intervention in order to improve the following deficits and impairments:  Decreased endurance,Hypomobility,Decreased activity tolerance,Pain,Impaired UE functional use,Decreased strength,Increased fascial restricitons,Increased muscle spasms,Decreased mobility,Decreased balance,Decreased range of motion,Improper body mechanics,Impaired perceived functional ability,Postural dysfunction,Impaired flexibility  Visit Diagnosis: Cervicalgia  Pain in thoracic spine  Chronic left shoulder pain  Abnormal posture     Problem List Patient Active Problem List   Diagnosis Date Noted  . Motion sickness 05/18/2020  . Shoulder pain, left 05/03/2020  . Plantar fasciitis of left foot 01/18/2020  . 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 Dyslipidemia 03/09/2019  . Otitis media 08/04/2018  . Acute sinusitis 08/04/2018  . Anal fissure  01/13/2018  . Shoulder pain, right 01/13/2018  . Ankle edema 11/25/2017  . Trochanteric bursitis of left hip 11/25/2017  . Cough 08/24/2016  . Wheezing 08/24/2016  . Inflammatory arthritis 07/13/2016  . Primary osteoarthritis of both hands 07/13/2016  . Primary osteoarthritis of both feet 07/13/2016  . Spondylosis of lumbar region without myelopathy or radiculopathy 07/13/2016  . Trochanteric bursitis of both hips 07/13/2016  . History of hepatitis B 07/13/2016  . Acute bronchitis 07/06/2016  . Constipation 07/12/2015  . Vitamin D deficiency 07/12/2015  . Pain of  both heels 11/03/2013  . Arthritis pain, hand 11/17/2012  . Mouth sores 11/17/2012  . Blood pressure elevated without history of HTN 07/07/2012  . Palpitations 04/07/2012  . Patellar tendonitis, right 11/07/2011  . Well adult exam 03/05/2011  . ABDOMINAL PAIN, GENERALIZED 06/13/2010  . HEMORRHOIDS 05/22/2010  . RECTAL PAIN 05/22/2010  . HEMATOCHEZIA 05/22/2010  . HYPOGLYCEMIA 12/01/2009  . HYPOGONADISM 12/01/2009  . LOW BACK PAIN, ACUTE 12/01/2009  . HERPES SIMPLEX WITHOUT MENTION OF COMPLICATION 32/07/3341  . PANIC DISORDER 11/24/2009  . DEPRESSIVE DISORDER 11/24/2009  . GERD 09/13/2009  . PES PLANUS 03/16/2009  . REACTIVE AIRWAY DISEASE 03/09/2009  . SNORING 03/09/2009  . Anxiety disorder 02/24/2009  . HYPOTHYROIDISM 10/25/2008  . BACK PAIN 08/23/2008  . Pain in joint, lower leg 12/29/2007  . MRSA INFECTION 01/21/2007  . HEPATITIS B 01/21/2007  . ALLERGIC RHINITIS 01/21/2007  . ABDOMINAL WALL HERNIA 01/21/2007      Laureen Abrahams, PT, DPT 05/30/20 9:28 AM    [facility] Physical Therapy [address] [place], [place], [address] Phone: [phone]   Fax:  [phone]  Name: Glen [name] MRN: [id number] Date of Birth: November 26, 1965

## 2020-06-07 ENCOUNTER — Other Ambulatory Visit: Payer: Self-pay

## 2020-06-07 ENCOUNTER — Encounter: Payer: Self-pay | Admitting: Physical Therapy

## 2020-06-07 ENCOUNTER — Ambulatory Visit: Payer: Self-pay | Admitting: Physical Therapy

## 2020-06-07 DIAGNOSIS — M25512 Pain in left shoulder: Secondary | ICD-10-CM

## 2020-06-07 DIAGNOSIS — R293 Abnormal posture: Secondary | ICD-10-CM

## 2020-06-07 DIAGNOSIS — G8929 Other chronic pain: Secondary | ICD-10-CM

## 2020-06-07 DIAGNOSIS — M542 Cervicalgia: Secondary | ICD-10-CM

## 2020-06-07 DIAGNOSIS — M546 Pain in thoracic spine: Secondary | ICD-10-CM

## 2020-06-07 NOTE — Therapy (Signed)
[facility] Physical Therapy [address] [place], [place], [address] Phone: [phone]   Fax:  [phone]  Physical Therapy Treatment  Patient Details  Name: Glen [name] MRN: [id number] Date of Birth: Apr 19, 1966 Referring Provider (PT): Dr. Lynne Leader   Encounter Date: 06/07/2020   PT End of Session - 06/07/20 1250    Visit Number 3    Number of Visits 12    Date for PT Re-Evaluation 07/18/20    PT Start Time 7425    PT Stop Time 1228    PT Time Calculation (min) 43 min    Activity Tolerance Patient tolerated treatment well    Behavior During Therapy [facility] for tasks assessed/performed           Past Medical History:  Diagnosis Date  . Allergic rhinitis   . Anxiety   . Asthmatic bronchitis   . Dry eyes   . GERD (gastroesophageal reflux disease)   . Hepatitis B infection 2008   recovered  . Hypothyroidism 2010   Dr. Wende Bushy  . Internal hemorrhoids   . Urethritis 2010    Past Surgical History:  Procedure Laterality Date  . FACIAL COSMETIC SURGERY    . LIPOSUCTION    . TEAR DUCT PROBING  2009    There were no vitals filed for this visit.   Subjective Assessment - 06/07/20 1146    Subjective had MRI done - has bulging disc in C4/5    Patient Stated Goals Reduce pain, be able to perform work    Currently in Pain? Yes    Pain Score 6     Pain Location Shoulder    Pain Orientation Left;Posterior    Pain Descriptors / Indicators Burning;Tightness    Pain Type Chronic pain    Pain Onset More than a month ago    Pain Frequency Constant    Aggravating Factors  sleeping, occupation    Pain Relieving Factors injection, TENS unit, meds                             [facility] Adult PT Treatment/Exercise - 06/07/20 1151      Shoulder Exercises: ROM/Strengthening   UBE (Upper Arm Bike) L3 x 6 min (3' each direction)      Modalities   Modalities Traction      Traction   Type of Traction Cervical    Min (lbs) 18    Max  (lbs) 23    Hold Time 60    Rest Time 20    Time 10      Manual Therapy   Manual therapy comments compression to Lt upper trap, levator scapula, supraspinatus and rhomboids            Trigger Point Dry Needling - 06/07/20 1250    Consent Given? Yes    Education Handout Provided Previously provided    Muscles Treated Head and Neck Levator scapulae    Muscles Treated Upper Quadrant Supraspinatus;Subscapularis    Levator Scapulae Response Twitch response elicited    Supraspinatus Response Twitch response elicited                  PT Short Term Goals - 05/30/20 0925      PT SHORT TERM GOAL #1   Title Patient will demonstrate independent use of home exercise program to maintain progress from in clinic treatments.    Time 3    Period Weeks    Status  Achieved    Target Date 06/13/20             PT Long Term Goals - 05/30/20 0925      PT LONG TERM GOAL #1   Title Patient will demonstrate/report pain at worst less than or equal to 2/10 to facilitate minimal limitation in daily activity secondary to pain symptoms.    Time 8    Period Weeks    Status On-going    Target Date 07/18/20      PT LONG TERM GOAL #2   Title Patient will demonstrate independent use of home exercise program to facilitate ability to maintain/progress functional gains from skilled physical therapy services.    Time 8    Period Weeks    Status On-going      PT LONG TERM GOAL #3   Title Pt. will demonstrate cervical and thoracic movement at PLOF s limitation 100 % WFL to facilitate usual daily and work related activity.    Time 8    Period Weeks    Status On-going      PT LONG TERM GOAL #4   Title Pt. will demonstrate /report ability to sleep s restriction due to symptoms.    Time 8    Period Weeks    Status On-going                 Plan - 06/07/20 1250    Clinical Impression Statement Pt tolerated session well today with positive response to DN and traction today reporting  decreased burning sensation following traction today.  Will continue to beneift from PT to maximize function.    Examination-Activity Limitations Sit;Sleep;Carry;Reach Overhead;Lift    Examination-Participation Restrictions Occupation;Community Activity;Driving    Stability/Clinical Decision Making Stable/Uncomplicated    Rehab Potential Good    PT Frequency --   1-2x/week   PT Duration 8 weeks    PT Treatment/Interventions ADLs/Self Care Home Management;Cryotherapy;Electrical Stimulation;Iontophoresis 4mg /ml Dexamethasone;Moist Heat;Traction;Ultrasound;Neuromuscular re-education;Stair training;Functional mobility training;Therapeutic activities;Therapeutic exercise;Balance training;Manual techniques;Joint Manipulations;Spinal Manipulations;Passive range of motion;Dry needling    PT Next Visit Plan Reassess DN for upper trap, rhomboid, levator.  Improve thoracic mobility., assess response to traction    PT Home Exercise Plan [id number]    [id number] and Agree with Plan of Care Patient           Patient will benefit from skilled therapeutic intervention in order to improve the following deficits and impairments:  Decreased endurance,Hypomobility,Decreased activity tolerance,Pain,Impaired UE functional use,Decreased strength,Increased fascial restricitons,Increased muscle spasms,Decreased mobility,Decreased balance,Decreased range of motion,Improper body mechanics,Impaired perceived functional ability,Postural dysfunction,Impaired flexibility  Visit Diagnosis: Cervicalgia  Pain in thoracic spine  Chronic left shoulder pain  Abnormal posture     Problem List Patient Active Problem List   Diagnosis Date Noted  . Motion sickness 05/18/2020  . Shoulder pain, left 05/03/2020  . Plantar fasciitis of left foot 01/18/2020  . 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 Dyslipidemia 03/09/2019  . Otitis media 08/04/2018  . Acute sinusitis 08/04/2018  . Anal fissure 01/13/2018  . Shoulder pain, right  01/13/2018  . Ankle edema 11/25/2017  . Trochanteric bursitis of left hip 11/25/2017  . Cough 08/24/2016  . Wheezing 08/24/2016  . Inflammatory arthritis 07/13/2016  . Primary osteoarthritis of both hands 07/13/2016  . Primary osteoarthritis of both feet 07/13/2016  . Spondylosis of lumbar region without myelopathy or radiculopathy 07/13/2016  . Trochanteric bursitis of both hips 07/13/2016  . History of hepatitis B 07/13/2016  . Acute bronchitis 07/06/2016  . Constipation 07/12/2015  .  Vitamin D deficiency 07/12/2015  . Pain of both heels 11/03/2013  . Arthritis pain, hand 11/17/2012  . Mouth sores 11/17/2012  . Blood pressure elevated without history of HTN 07/07/2012  . Palpitations 04/07/2012  . Patellar tendonitis, right 11/07/2011  . Well adult exam 03/05/2011  . ABDOMINAL PAIN, GENERALIZED 06/13/2010  . HEMORRHOIDS 05/22/2010  . RECTAL PAIN 05/22/2010  . HEMATOCHEZIA 05/22/2010  . HYPOGLYCEMIA 12/01/2009  . HYPOGONADISM 12/01/2009  . LOW BACK PAIN, ACUTE 12/01/2009  . HERPES SIMPLEX WITHOUT MENTION OF COMPLICATION 09/32/3557  . PANIC DISORDER 11/24/2009  . DEPRESSIVE DISORDER 11/24/2009  . GERD 09/13/2009  . PES PLANUS 03/16/2009  . REACTIVE AIRWAY DISEASE 03/09/2009  . SNORING 03/09/2009  . Anxiety disorder 02/24/2009  . HYPOTHYROIDISM 10/25/2008  . BACK PAIN 08/23/2008  . Pain in joint, lower leg 12/29/2007  . MRSA INFECTION 01/21/2007  . HEPATITIS B 01/21/2007  . ALLERGIC RHINITIS 01/21/2007  . ABDOMINAL WALL HERNIA 01/21/2007     Laureen Abrahams, PT, DPT 06/07/20 12:52 PM     [facility] Physical Therapy [address] [place], [place], [address] Phone: [phone]   Fax:  [phone]  Name: Glen [name] MRN: [id number] Date of Birth: [birth year]

## 2020-06-09 ENCOUNTER — Other Ambulatory Visit: Payer: No Typology Code available for payment source

## 2020-06-13 ENCOUNTER — Ambulatory Visit: Payer: Self-pay | Admitting: Physical Therapy

## 2020-06-13 ENCOUNTER — Encounter: Payer: Self-pay | Admitting: Physical Therapy

## 2020-06-13 ENCOUNTER — Other Ambulatory Visit: Payer: Self-pay

## 2020-06-13 DIAGNOSIS — M25512 Pain in left shoulder: Secondary | ICD-10-CM

## 2020-06-13 DIAGNOSIS — R293 Abnormal posture: Secondary | ICD-10-CM

## 2020-06-13 DIAGNOSIS — M546 Pain in thoracic spine: Secondary | ICD-10-CM

## 2020-06-13 DIAGNOSIS — G8929 Other chronic pain: Secondary | ICD-10-CM

## 2020-06-13 DIAGNOSIS — M542 Cervicalgia: Secondary | ICD-10-CM

## 2020-06-13 NOTE — Therapy (Addendum)
[facility] Physical Therapy [address] [place], [place], [address] Phone: [phone]   Fax:  [phone]  Physical Therapy Treatment/Discharge Summary  Patient Details  Name: Glen [name] MRN: [id number] Date of Birth: 09/30/1965 Referring Provider (PT): Dr. Evan Corey   Encounter Date: 06/13/2020   PT End of Session - 06/13/20 1001    Visit Number 4    Number of Visits 12    Date for PT Re-Evaluation 07/18/20    PT Start Time 0930    PT Stop Time 0957    PT Time Calculation (min) 27 min    Activity Tolerance Patient tolerated treatment well    Behavior During Therapy WFL for tasks assessed/performed           Past Medical History:  Diagnosis Date  . Allergic rhinitis   . Anxiety   . Asthmatic bronchitis   . Dry eyes   . GERD (gastroesophageal reflux disease)   . Hepatitis B infection 2008   recovered  . Hypothyroidism 2010   Dr. Roy Watkins  . Internal hemorrhoids   . Urethritis 2010    Past Surgical History:  Procedure Laterality Date  . FACIAL COSMETIC SURGERY    . LIPOSUCTION    . TEAR DUCT PROBING  2009    There were no vitals filed for this visit.   Subjective Assessment - 06/13/20 0927    Subjective pain is a little better; still having some radicular pain.  injection scheduled 1/4 @ Dr. Ramos office    Patient Stated Goals Reduce pain, be able to perform work    Currently in Pain? Yes    Pain Score 6     Pain Location Scapula    Pain Orientation Left;Posterior    Pain Descriptors / Indicators Burning;Tightness    Pain Type Chronic pain    Pain Onset More than a month ago    Pain Frequency Constant                             [facility] Adult PT Treatment/Exercise - 06/13/20 0001      Manual Therapy   Manual therapy comments compression to Lt levator scapula and rhomboids            Trigger Point Dry Needling - 06/13/20 1000    Consent Given? Yes    Education Handout Provided Previously provided     Muscles Treated Head and Neck Levator scapulae    Muscles Treated Upper Quadrant Rhomboids    Levator Scapulae Response Twitch response elicited    Rhomboids Response Twitch response elicited                  PT Short Term Goals - 05/30/20 0925      PT SHORT TERM GOAL #1   Title Patient will demonstrate independent use of home exercise program to maintain progress from in clinic treatments.    Time 3    Period Weeks    Status Achieved    Target Date 06/13/20             PT Long Term Goals - 05/30/20 0925      PT LONG TERM GOAL #1   Title Patient will demonstrate/report pain at worst less than or equal to 2/10 to facilitate minimal limitation in daily activity secondary to pain symptoms.    Time 8    Period Weeks    Status On-going    Target Date 07/18/20        PT LONG TERM GOAL #2   Title Patient will demonstrate independent use of home exercise program to facilitate ability to maintain/progress functional gains from skilled physical therapy services.    Time 8    Period Weeks    Status On-going      PT LONG TERM GOAL #3   Title Pt. will demonstrate cervical and thoracic movement at PLOF s limitation 100 % WFL to facilitate usual daily and work related activity.    Time 8    Period Weeks    Status On-going      PT LONG TERM GOAL #4   Title Pt. will demonstrate /report ability to sleep s restriction due to symptoms.    Time 8    Period Weeks    Status On-going                 Plan - 06/13/20 1001    Clinical Impression Statement Positive response to manual therapy and DN over past few sessions, and also felt traction was not significant beneficial.  Plan to hold PT until after injection and will see if additional interventions needed.    Examination-Activity Limitations Sit;Sleep;Carry;Reach Overhead;Lift    Examination-Participation Restrictions Occupation;Community Activity;Driving    Stability/Clinical Decision Making Stable/Uncomplicated     Rehab Potential Good    PT Frequency --   1-2x/week   PT Duration 8 weeks    PT Treatment/Interventions ADLs/Self Care Home Management;Cryotherapy;Electrical Stimulation;Iontophoresis 4mg/ml Dexamethasone;Moist Heat;Traction;Ultrasound;Neuromuscular re-education;Stair training;Functional mobility training;Therapeutic activities;Therapeutic exercise;Balance training;Manual techniques;Joint Manipulations;Spinal Manipulations;Passive range of motion;Dry needling    PT Next Visit Plan Reassess DN for upper trap, rhomboid, levator.  Improve thoracic mobility; see how injection went and how he's responding    PT Home Exercise Plan [id number]    Consulted and Agree with Plan of Care Patient           Patient will benefit from skilled therapeutic intervention in order to improve the following deficits and impairments:  Decreased endurance,Hypomobility,Decreased activity tolerance,Pain,Impaired UE functional use,Decreased strength,Increased fascial restricitons,Increased muscle spasms,Decreased mobility,Decreased balance,Decreased range of motion,Improper body mechanics,Impaired perceived functional ability,Postural dysfunction,Impaired flexibility  Visit Diagnosis: Cervicalgia  Pain in thoracic spine  Chronic left shoulder pain  Abnormal posture     Problem List Patient Active Problem List   Diagnosis Date Noted  . Motion sickness 05/18/2020  . Shoulder pain, left 05/03/2020  . Plantar fasciitis of left foot 01/18/2020  . 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 Dyslipidemia 03/09/2019  . Otitis media 08/04/2018  . Acute sinusitis 08/04/2018  . Anal fissure 01/13/2018  . Shoulder pain, right 01/13/2018  . Ankle edema 11/25/2017  . Trochanteric bursitis of left hip 11/25/2017  . Cough 08/24/2016  . Wheezing 08/24/2016  . Inflammatory arthritis 07/13/2016  . Primary osteoarthritis of both hands 07/13/2016  . Primary osteoarthritis of both feet 07/13/2016  . Spondylosis of lumbar region without  myelopathy or radiculopathy 07/13/2016  . Trochanteric bursitis of both hips 07/13/2016  . History of hepatitis B 07/13/2016  . Acute bronchitis 07/06/2016  . Constipation 07/12/2015  . Vitamin D deficiency 07/12/2015  . Pain of both heels 11/03/2013  . Arthritis pain, hand 11/17/2012  . Mouth sores 11/17/2012  . Blood pressure elevated without history of HTN 07/07/2012  . Palpitations 04/07/2012  . Patellar tendonitis, right 11/07/2011  . Well adult exam 03/05/2011  . ABDOMINAL PAIN, GENERALIZED 06/13/2010  . HEMORRHOIDS 05/22/2010  . RECTAL PAIN 05/22/2010  . HEMATOCHEZIA 05/22/2010  . HYPOGLYCEMIA 12/01/2009  . HYPOGONADISM 12/01/2009  . LOW BACK   PAIN, ACUTE 12/01/2009  . HERPES SIMPLEX WITHOUT MENTION OF COMPLICATION 11/24/2009  . PANIC DISORDER 11/24/2009  . DEPRESSIVE DISORDER 11/24/2009  . GERD 09/13/2009  . PES PLANUS 03/16/2009  . REACTIVE AIRWAY DISEASE 03/09/2009  . SNORING 03/09/2009  . Anxiety disorder 02/24/2009  . HYPOTHYROIDISM 10/25/2008  . BACK PAIN 08/23/2008  . Pain in joint, lower leg 12/29/2007  . MRSA INFECTION 01/21/2007  . HEPATITIS B 01/21/2007  . ALLERGIC RHINITIS 01/21/2007  . ABDOMINAL WALL HERNIA 01/21/2007       F , PT, DPT 06/13/20 10:04 AM     [facility] Therapy [address] [place], [place], [address] Phone: [phone]   Fax:  [phone]  Name: Glen [name] MRN: [id number] Date of Birth: 04/17/1966     PHYSICAL THERAPY DISCHARGE SUMMARY  Visits from Start of Care: 4  Current functional level related to goals / functional outcomes: See above   Remaining deficits: See above-planning surgery   Education / Equipment: HEP  Plan: Patient agrees to discharge.  Patient goals were not met. Patient is being discharged due to not returning since the last visit.  ?????      F , PT, DPT 07/27/20 2:21 PM  [facility] Physical Therapy [address] [place], [address] Phone: [phone]   Fax:  [phone]  

## 2020-06-14 ENCOUNTER — Encounter: Payer: No Typology Code available for payment source | Admitting: Physical Therapy

## 2020-06-15 ENCOUNTER — Encounter: Payer: Self-pay | Admitting: Podiatry

## 2020-06-15 ENCOUNTER — Ambulatory Visit (INDEPENDENT_AMBULATORY_CARE_PROVIDER_SITE_OTHER): Payer: Self-pay | Admitting: Podiatry

## 2020-06-15 ENCOUNTER — Other Ambulatory Visit: Payer: Self-pay

## 2020-06-15 DIAGNOSIS — M216X2 Other acquired deformities of left foot: Secondary | ICD-10-CM

## 2020-06-15 DIAGNOSIS — M722 Plantar fascial fibromatosis: Secondary | ICD-10-CM

## 2020-06-15 DIAGNOSIS — M21862 Other specified acquired deformities of left lower leg: Secondary | ICD-10-CM

## 2020-06-15 DIAGNOSIS — Z9889 Other specified postprocedural states: Secondary | ICD-10-CM

## 2020-06-15 NOTE — Progress Notes (Signed)
Subjective:  Patient ID: Glen [name], male    DOB: [birth year],  MRN: [id number]  Chief Complaint  Patient presents with  . Routine Post Op    PT stated that he is doing well he has no pain at this time. His only concern is that he is not able to stand on his tip toes.     54 y.o. male returns for post-op check.  Patient is doing a lot better.  He states he does not have pain.  He has been ambulating in regular shoes on without any acute complaints.  Review of Systems: Negative except as noted in the HPI. Denies N/V/F/Ch.  Past Medical History:  Diagnosis Date  . Allergic rhinitis   . Anxiety   . Asthmatic bronchitis   . Dry eyes   . GERD (gastroesophageal reflux disease)   . Hepatitis B infection 2008   recovered  . Hypothyroidism 2010   Dr. Raquel Sarna  . Internal hemorrhoids   . Urethritis 2010    Current Outpatient Medications:  .  Cholecalciferol (VITAMIN D3) 2000 units capsule, Take 1 capsule (2,000 Units total) by mouth daily., Disp: 100 capsule, Rfl: 3 .  citalopram (CELEXA) 10 MG tablet, Take 1 tablet by mouth once daily, Disp: 90 tablet, Rfl: 0 .  methylPREDNISolone (MEDROL DOSEPAK) 4 MG TBPK tablet, As directed, Disp: 21 tablet, Rfl: 0 .  omeprazole (PRILOSEC) 40 MG capsule, Take 1 capsule (40 mg total) by mouth daily., Disp: 30 capsule, Rfl: 3 .  propranolol (INDERAL) 20 MG tablet, Take 1-2 tablets (20-40 mg total) by mouth 2 (two) times daily as needed (palpitations). (Patient not taking: Reported on 05/18/2020), Disp: 60 tablet, Rfl: 3 .  tiZANidine (ZANAFLEX) 4 MG tablet, Take 1 tablet (4 mg total) by mouth every 6 (six) hours as needed for muscle spasms. (Patient not taking: Reported on 05/18/2020), Disp: 30 tablet, Rfl: 1 .  valACYclovir (VALTREX) 500 MG tablet, TAKE 1 TABLET BY MOUTH TWICE DAILY AS NEEDED FOR FEVER BLISTERS FOR 5 DAYS, Disp: 30 tablet, Rfl: 0 .  ZOMIG 5 MG nasal solution, USE ONE DOSE IN NOSTRIL AS NEEDED FOR MIGRAINE, Disp: 6 Units, Rfl:  3  Social History   Tobacco Use  Smoking Status Former Smoker  . Packs/day: 1.50  . Types: Cigarettes  . Quit date: 06/19/1999  . Years since quitting: 21.0  Smokeless Tobacco Never Used    Allergies  Allergen Reactions  . Codeine     ? Mental status changes  . Penicillins     Mother said don't take   Objective:  There were no vitals filed for this visit. There is no height or weight on file to calculate BMI. Constitutional Well developed. Well nourished.  Vascular Foot warm and well perfused. Capillary refill normal to all digits.   Neurologic Normal speech. Oriented to person, place, and time. Epicritic sensation to light touch grossly present bilaterally.  Dermatologic  skin completely reepithelialized.  Greater than 10 of dorsiflexion noted to the ankle.  Orthopedic:  Mild tenderness to palpation noted about the surgical site.   Radiographs: None Assessment:   1. Plantar fasciitis, left   2. Gastrocnemius equinus, left   3. Status post foot surgery    Plan:  Patient was evaluated and treated and all questions answered.  S/p foot surgery left -Progressing as expected post-operatively. -XR: None -WB Status: Weightbearing as tolerated in regular shoes -Sutures: Removed.  No cellulitis is noted.  No clinical signs of infection noted. -Medications: None -  Patient has transition to regular shoes without acute complaints.  His range of motion has improved drastically.  He does not need any physical therapy.  Given amazing improvement patient is officially discharged from my care.  If any foot and ankle issues arise in the future come back and see me.  He states understanding  No follow-ups on file.

## 2020-06-20 ENCOUNTER — Encounter: Payer: No Typology Code available for payment source | Admitting: Rehabilitative and Restorative Service Providers"

## 2020-06-21 ENCOUNTER — Encounter: Payer: No Typology Code available for payment source | Admitting: Rehabilitative and Restorative Service Providers"

## 2020-06-28 ENCOUNTER — Other Ambulatory Visit: Payer: Self-pay | Admitting: Internal Medicine

## 2020-06-28 ENCOUNTER — Encounter: Payer: Self-pay | Admitting: Physical Therapy

## 2020-07-03 ENCOUNTER — Other Ambulatory Visit: Payer: Self-pay | Admitting: Internal Medicine

## 2020-07-03 MED ORDER — TRAMADOL HCL 50 MG PO TABS: 50.0000 mg | ORAL_TABLET | Freq: Four times a day (QID) | ORAL | 0 refills | Status: AC | PRN

## 2020-07-20 ENCOUNTER — Other Ambulatory Visit: Payer: Self-pay | Admitting: Neurosurgery

## 2020-07-26 ENCOUNTER — Encounter: Payer: Self-pay | Admitting: Podiatry

## 2020-07-27 ENCOUNTER — Telehealth: Payer: Self-pay | Admitting: *Deleted

## 2020-07-27 NOTE — Telephone Encounter (Signed)
Patient is calling with some concerns about his left ankle still swelling thru out day 3 months after surgery,does stand a lot at work.  Called and scheduled /confirmed appointment with patient on Friday 07/29/20.

## 2020-07-29 ENCOUNTER — Other Ambulatory Visit: Payer: Self-pay

## 2020-07-29 ENCOUNTER — Ambulatory Visit (INDEPENDENT_AMBULATORY_CARE_PROVIDER_SITE_OTHER): Payer: 59

## 2020-07-29 ENCOUNTER — Ambulatory Visit (INDEPENDENT_AMBULATORY_CARE_PROVIDER_SITE_OTHER): Payer: 59 | Admitting: Podiatry

## 2020-07-29 DIAGNOSIS — M25472 Effusion, left ankle: Secondary | ICD-10-CM | POA: Diagnosis not present

## 2020-07-29 DIAGNOSIS — M722 Plantar fascial fibromatosis: Secondary | ICD-10-CM

## 2020-08-02 ENCOUNTER — Encounter: Payer: Self-pay | Admitting: Podiatry

## 2020-08-02 NOTE — Progress Notes (Signed)
Subjective:  Patient ID: Glen [name], male    DOB: 1965/10/13,  MRN: [id number]  Chief Complaint  Patient presents with  . Foot Pain    Left foot pain. Pt stated that his foot has had some swelling and he just wants to make sure everything is okay.    55 y.o. male presents with the above complaint.  Patient presents with a new complaining of left ankle pain.  Patient states that he is getting some swelling around the ankle especially with his been standing on his feet for long period of time.  He states his surgical sites are doing well however he gets swelling around circumferentially around the ankle.  He wants to know if this is due to surgery or something else.  He denies any other acute complaints he does not wear direct compression socks when he is working.   Review of Systems: Negative except as noted in the HPI. Denies N/V/F/Ch.  Past Medical History:  Diagnosis Date  . Allergic rhinitis   . Anxiety   . Asthmatic bronchitis   . Dry eyes   . GERD (gastroesophageal reflux disease)   . Hepatitis B infection 2008   recovered  . Hypothyroidism 2010   Dr. Wende Bushy  . Internal hemorrhoids   . Urethritis 2010    Current Outpatient Medications:  .  Cholecalciferol (VITAMIN D3) 2000 units capsule, Take 1 capsule (2,000 Units total) by mouth daily., Disp: 100 capsule, Rfl: 3 .  citalopram (CELEXA) 10 MG tablet, Take 1 tablet by mouth once daily, Disp: 90 tablet, Rfl: 0 .  methylPREDNISolone (MEDROL DOSEPAK) 4 MG TBPK tablet, As directed, Disp: 21 tablet, Rfl: 0 .  omeprazole (PRILOSEC) 40 MG capsule, Take 1 capsule (40 mg total) by mouth daily., Disp: 30 capsule, Rfl: 3 .  propranolol (INDERAL) 20 MG tablet, Take 1-2 tablets (20-40 mg total) by mouth 2 (two) times daily as needed (palpitations). (Patient not taking: Reported on 05/18/2020), Disp: 60 tablet, Rfl: 3 .  tiZANidine (ZANAFLEX) 4 MG tablet, Take 1 tablet (4 mg total) by mouth every 6 (six) hours as needed for muscle  spasms. (Patient not taking: Reported on 05/18/2020), Disp: 30 tablet, Rfl: 1 .  valACYclovir (VALTREX) 500 MG tablet, TAKE 1 TABLET BY MOUTH TWICE DAILY AS NEEDED FOR FEVER BLISTERS FOR 5 DAYS, Disp: 30 tablet, Rfl: 0 .  ZOMIG 5 MG nasal solution, USE ONE DOSE IN NOSTRIL AS NEEDED FOR MIGRAINE, Disp: 6 Units, Rfl: 3  Social History   Tobacco Use  Smoking Status Former Smoker  . Packs/day: 1.50  . Types: Cigarettes  . Quit date: 06/19/1999  . Years since quitting: 21.1  Smokeless Tobacco Never Used    Allergies  Allergen Reactions  . Codeine     ? Mental status changes  . Penicillins     Mother said don't take   Objective:  There were no vitals filed for this visit. There is no height or weight on file to calculate BMI. Constitutional Well developed. Well nourished.  Vascular Dorsalis pedis pulses palpable bilaterally. Posterior tibial pulses palpable bilaterally. Capillary refill normal to all digits.  No cyanosis or clubbing noted. Pedal hair growth normal.  Neurologic Normal speech. Oriented to person, place, and time. Epicritic sensation to light touch grossly present bilaterally.  Dermatologic Nails well groomed and normal in appearance. No open wounds. No skin lesions.  Orthopedic:  Mild to moderate generalized ankle edema noted.  1+ pitting edema.  No pain on palpation to the  edema.  No pain with range of motion of the ankle joint.  No pain at the surgical sites.   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  There is an increase in soft tissue density and volume around the ankle.  No bony abnormalities noted.  No fractures noted. Assessment:   1. Edema of left ankle    Plan:  Patient was evaluated and treated and all questions answered.  Left ankle generalized edema secondary to possible arthritis versus dependent position -I explained the patient the etiology of generalized edema and various treatment  options were discussed.  This is likely attributed to the dependent edema as patient swelling does reduce first thing in the morning.  I discussed with him the use of compression socks when he is working in a dependent position.  Patient states understanding will obtain them.  Clinically he does not have any deep ankle pain however we will continue to monitor that.  No follow-ups on file.

## 2020-08-08 ENCOUNTER — Other Ambulatory Visit: Payer: Self-pay

## 2020-08-08 ENCOUNTER — Encounter: Payer: Self-pay | Admitting: Internal Medicine

## 2020-08-08 ENCOUNTER — Ambulatory Visit (INDEPENDENT_AMBULATORY_CARE_PROVIDER_SITE_OTHER): Payer: 59 | Admitting: Internal Medicine

## 2020-08-08 VITALS — BP 138/84 | HR 76 | Temp 98.3°F | Ht 74.0 in | Wt 210.2 lb

## 2020-08-08 DIAGNOSIS — E559 Vitamin D deficiency, unspecified: Secondary | ICD-10-CM | POA: Diagnosis not present

## 2020-08-08 DIAGNOSIS — M542 Cervicalgia: Secondary | ICD-10-CM | POA: Diagnosis not present

## 2020-08-08 DIAGNOSIS — Z23 Encounter for immunization: Secondary | ICD-10-CM | POA: Diagnosis not present

## 2020-08-08 DIAGNOSIS — Z0000 Encounter for general adult medical examination without abnormal findings: Secondary | ICD-10-CM

## 2020-08-08 LAB — CBC WITH DIFFERENTIAL/PLATELET
Basophils Absolute: 0 10*3/uL (ref 0.0–0.1)
Basophils Relative: 0.5 % (ref 0.0–3.0)
Eosinophils Absolute: 0.1 10*3/uL (ref 0.0–0.7)
Eosinophils Relative: 0.8 % (ref 0.0–5.0)
HCT: 42.3 % (ref 39.0–52.0)
Hemoglobin: 14.7 g/dL (ref 13.0–17.0)
Lymphocytes Relative: 21.2 % (ref 12.0–46.0)
Lymphs Abs: 1.3 10*3/uL (ref 0.7–4.0)
MCHC: 34.8 g/dL (ref 30.0–36.0)
MCV: 90.1 fl (ref 78.0–100.0)
Monocytes Absolute: 0.4 10*3/uL (ref 0.1–1.0)
Monocytes Relative: 6 % (ref 3.0–12.0)
Neutro Abs: 4.5 10*3/uL (ref 1.4–7.7)
Neutrophils Relative %: 71.5 % (ref 43.0–77.0)
Platelets: 236 10*3/uL (ref 150.0–400.0)
RBC: 4.69 Mil/uL (ref 4.22–5.81)
RDW: 13.1 % (ref 11.5–15.5)
WBC: 6.4 10*3/uL (ref 4.0–10.5)

## 2020-08-08 LAB — COMPREHENSIVE METABOLIC PANEL
ALT: 16 U/L (ref 0–53)
AST: 20 U/L (ref 0–37)
Albumin: 4.3 g/dL (ref 3.5–5.2)
Alkaline Phosphatase: 47 U/L (ref 39–117)
BUN: 11 mg/dL (ref 6–23)
CO2: 29 mEq/L (ref 19–32)
Calcium: 9.6 mg/dL (ref 8.4–10.5)
Chloride: 102 mEq/L (ref 96–112)
Creatinine, Ser: 0.92 mg/dL (ref 0.40–1.50)
GFR: 93.68 mL/min (ref >60.00)
Glucose, Bld: 92 mg/dL (ref 70–99)
Potassium: 4.6 mEq/L (ref 3.5–5.1)
Sodium: 140 mEq/L (ref 135–145)
Total Bilirubin: 0.8 mg/dL (ref 0.2–1.2)
Total Protein: 6.7 g/dL (ref 6.0–8.3)

## 2020-08-08 LAB — URINALYSIS
Bilirubin Urine: NEGATIVE
Hgb urine dipstick: NEGATIVE
Ketones, ur: NEGATIVE
Leukocytes,Ua: NEGATIVE
Nitrite: NEGATIVE
Specific Gravity, Urine: 1.015 (ref 1.000–1.030)
Total Protein, Urine: NEGATIVE
Urine Glucose: NEGATIVE
Urobilinogen, UA: 0.2 (ref 0.0–1.0)
pH: 7 (ref 5.0–8.0)

## 2020-08-08 LAB — PSA: PSA: 1.08 ng/mL (ref 0.10–4.00)

## 2020-08-08 LAB — LIPID PANEL
Cholesterol: 198 mg/dL (ref 0–200)
HDL: 53.4 mg/dL (ref >39.00)
LDL Cholesterol: 132 mg/dL — ABNORMAL HIGH (ref 0–99)
NonHDL: 144.75
Total CHOL/HDL Ratio: 4
Triglycerides: 66 mg/dL (ref 0.0–149.0)
VLDL: 13.2 mg/dL (ref 0.0–40.0)

## 2020-08-08 LAB — VITAMIN B12: Vitamin B-12: 337 pg/mL (ref 211–911)

## 2020-08-08 LAB — VITAMIN D 25 HYDROXY (VIT D DEFICIENCY, FRACTURES): VITD: 38.03 ng/mL (ref 30.00–100.00)

## 2020-08-08 LAB — TSH: TSH: 1.11 u[IU]/mL (ref 0.35–4.50)

## 2020-08-08 MED ORDER — CITALOPRAM HYDROBROMIDE 10 MG PO TABS: 10.0000 mg | ORAL_TABLET | Freq: Every day | ORAL | 3 refills | Status: DC

## 2020-08-08 MED ORDER — TRAMADOL HCL 50 MG PO TABS
50.0000 mg | ORAL_TABLET | Freq: Three times a day (TID) | ORAL | 1 refills | Status: AC | PRN
Start: 2020-08-08 — End: 2020-08-13

## 2020-08-08 NOTE — Assessment & Plan Note (Signed)
Vit D 

## 2020-08-08 NOTE — Assessment & Plan Note (Signed)
Surgeery

## 2020-08-08 NOTE — Progress Notes (Signed)
Subjective:  Patient ID: Glen [name], male    DOB: [birth year]  Age: 55 y.o. MRN: [id number]  CC: Annual Exam   HPI [name] presents for a well exam  Outpatient Medications Prior to Visit  Medication Sig Dispense Refill  . Cholecalciferol (VITAMIN D3) 2000 units capsule Take 1 capsule (2,000 Units total) by mouth daily. 100 capsule 3  . citalopram (CELEXA) 10 MG tablet Take 1 tablet by mouth once daily 90 tablet 0  . omeprazole (PRILOSEC) 40 MG capsule Take 1 capsule (40 mg total) by mouth daily. 30 capsule 3  . valACYclovir (VALTREX) 500 MG tablet TAKE 1 TABLET BY MOUTH TWICE DAILY AS NEEDED FOR FEVER BLISTERS FOR 5 DAYS 30 tablet 0  . ZOMIG 5 MG nasal solution USE ONE DOSE IN NOSTRIL AS NEEDED FOR MIGRAINE 6 Units 3  . methylPREDNISolone (MEDROL DOSEPAK) 4 MG TBPK tablet As directed 21 tablet 0  . propranolol (INDERAL) 20 MG tablet Take 1-2 tablets (20-40 mg total) by mouth 2 (two) times daily as needed (palpitations). (Patient not taking: Reported on 05/18/2020) 60 tablet 3  . tiZANidine (ZANAFLEX) 4 MG tablet Take 1 tablet (4 mg total) by mouth every 6 (six) hours as needed for muscle spasms. (Patient not taking: No sig reported) 30 tablet 1   No facility-administered medications prior to visit.    ROS: Review of Systems  Constitutional: Negative for appetite change, fatigue and unexpected weight change.  HENT: Negative for congestion, nosebleeds, sneezing, sore throat and trouble swallowing.   Eyes: Negative for itching and visual disturbance.  Respiratory: Negative for cough.   Cardiovascular: Negative for chest pain, palpitations and leg swelling.  Gastrointestinal: Negative for abdominal distention, blood in stool, diarrhea and nausea.  Genitourinary: Negative for frequency and hematuria.  Musculoskeletal: Positive for neck pain and neck stiffness. Negative for back pain, gait problem and joint swelling.  Skin: Negative for rash.  Neurological: Negative for  dizziness, tremors, speech difficulty and weakness.  Psychiatric/Behavioral: Negative for agitation, dysphoric mood and sleep disturbance. The patient is not nervous/anxious.     Objective:  BP 138/84 (BP Location: Left Arm)   Pulse 76   Temp 98.3 F (36.8 C) (Oral)   Ht 6\' 2"  (1.88 m)   Wt 210 lb 3.2 oz (95.3 kg)   SpO2 98%   BMI 26.99 kg/m   BP Readings from Last 3 Encounters:  08/08/20 138/84  05/18/20 138/80  05/16/20 110/68    Wt Readings from Last 3 Encounters:  08/08/20 210 lb 3.2 oz (95.3 kg)  05/18/20 209 lb 3.2 oz (94.9 kg)  05/16/20 212 lb 12.8 oz (96.5 kg)    Physical Exam Constitutional:      General: He is not in acute distress.    Appearance: He is well-developed.     Comments: NAD  HENT:     Mouth/Throat:     Mouth: Oropharynx is clear and moist.  Eyes:     Conjunctiva/sclera: Conjunctivae normal.     Pupils: Pupils are equal, round, and reactive to light.  Neck:     Thyroid: No thyromegaly.     Vascular: No JVD.  Cardiovascular:     Rate and Rhythm: Normal rate and regular rhythm.     Pulses: Intact distal pulses.     Heart sounds: Normal heart sounds. No murmur heard. No friction rub. No gallop.   Pulmonary:     Effort: Pulmonary effort is normal. No respiratory distress.     Breath sounds:  Normal breath sounds. No wheezing or rales.  Chest:     Chest wall: No tenderness.  Abdominal:     General: Bowel sounds are normal. There is no distension.     Palpations: Abdomen is soft. There is no mass.     Tenderness: There is no abdominal tenderness. There is no guarding or rebound.  Musculoskeletal:        General: Tenderness present. No edema. Normal range of motion.     Cervical back: Normal range of motion.  Lymphadenopathy:     Cervical: No cervical adenopathy.  Skin:    General: Skin is warm and dry.     Findings: No rash.  Neurological:     Mental Status: He is alert and oriented to person, place, and time.     Cranial Nerves: No  cranial nerve deficit.     Motor: No abnormal muscle tone.     Coordination: He displays a negative Romberg sign. Coordination normal.     Gait: Gait normal.     Deep Tendon Reflexes: Reflexes are normal and symmetric.  Psychiatric:        Mood and Affect: Mood and affect normal.        Behavior: Behavior normal.        Thought Content: Thought content normal.        Judgment: Judgment normal.    Neck stiff   Lab Results  Component Value Date   WBC 5.1 03/02/2019   HGB 14.8 03/02/2019   HCT 42.0 03/02/2019   PLT 227.0 03/02/2019   GLUCOSE 96 03/02/2019   CHOL 197 03/02/2019   TRIG 41.0 03/02/2019   HDL 68.40 03/02/2019   LDLDIRECT 150.6 06/29/2013   LDLCALC 121 (H) 03/02/2019   ALT 20 03/02/2019   AST 23 03/02/2019   NA 138 03/02/2019   K 4.0 03/02/2019   CL 101 03/02/2019   CREATININE 0.99 03/02/2019   BUN 19 03/02/2019   CO2 30 03/02/2019   TSH 1.35 03/02/2019   PSA 0.90 03/02/2019   HGBA1C 5.3 12/02/2009    DG Cervical Spine Complete  Result Date: 05/04/2020 CLINICAL DATA:  Cervicalgia EXAM: CERVICAL SPINE-AP, NEUTRAL LATERAL, FLEXION LATERAL, EXTENSION LATERAL IMAGES COMPARISON:  None. FINDINGS: Frontal, neutral lateral, flexion lateral, extension lateral images were obtained. There is no fracture. There is no spondylolisthesis on neutral lateral imaging. There is no appreciable change in lateral alignment between neutral lateral, flexion lateral, and extension lateral imaging. Prevertebral soft tissues and predental space regions are normal. There is moderately severe disc space narrowing at C5-6, C6-7, and C7-T1. There are anterior osteophytes at C5, C6, and C7. No erosion. There is relative lack of lordosis on neutral lateral imaging. Lung apices are clear. IMPRESSION: Relative lack of lordosis on neutral lateral imaging may be indicative of a degree of muscle spasm. There is no fracture. There is no spondylolisthesis on neutral lateral imaging. There is no  appreciable change in lateral alignment between neutral lateral, flexion lateral, and extension lateral imaging. There is disc space narrowing consistent with osteoarthritic change at C5-6, C6-7, and C7-T1. Electronically Signed   By: Lowella Grip III M.D.   On: 05/04/2020 15:05    Assessment & Plan:    Walker Kehr, MD

## 2020-08-08 NOTE — Patient Instructions (Signed)

## 2020-08-08 NOTE — Assessment & Plan Note (Addendum)

## 2020-08-13 ENCOUNTER — Other Ambulatory Visit: Payer: Self-pay

## 2020-08-13 ENCOUNTER — Encounter (HOSPITAL_COMMUNITY): Payer: Self-pay | Admitting: Pharmacy Technician

## 2020-08-13 ENCOUNTER — Emergency Department (HOSPITAL_COMMUNITY)
Admission: EM | Admit: 2020-08-13 | Discharge: 2020-08-13 | Disposition: A | Discharge status: Home / Self Care | Payer: 59 | Attending: Emergency Medicine | Admitting: Emergency Medicine

## 2020-08-13 DIAGNOSIS — R42 Dizziness and giddiness: Secondary | ICD-10-CM | POA: Insufficient documentation

## 2020-08-13 DIAGNOSIS — Z79899 Other long term (current) drug therapy: Secondary | ICD-10-CM | POA: Insufficient documentation

## 2020-08-13 DIAGNOSIS — Z87891 Personal history of nicotine dependence: Secondary | ICD-10-CM | POA: Diagnosis not present

## 2020-08-13 DIAGNOSIS — J45909 Unspecified asthma, uncomplicated: Secondary | ICD-10-CM | POA: Insufficient documentation

## 2020-08-13 DIAGNOSIS — F419 Anxiety disorder, unspecified: Secondary | ICD-10-CM | POA: Insufficient documentation

## 2020-08-13 DIAGNOSIS — E039 Hypothyroidism, unspecified: Secondary | ICD-10-CM | POA: Diagnosis not present

## 2020-08-13 DIAGNOSIS — R55 Syncope and collapse: Secondary | ICD-10-CM | POA: Diagnosis not present

## 2020-08-13 DIAGNOSIS — R202 Paresthesia of skin: Secondary | ICD-10-CM | POA: Diagnosis not present

## 2020-08-13 DIAGNOSIS — R030 Elevated blood-pressure reading, without diagnosis of hypertension: Secondary | ICD-10-CM

## 2020-08-13 DIAGNOSIS — R064 Hyperventilation: Secondary | ICD-10-CM

## 2020-08-13 LAB — CBC
HCT: 41.9 % (ref 39.0–52.0)
Hemoglobin: 14.6 g/dL (ref 13.0–17.0)
MCH: 31.9 pg (ref 26.0–34.0)
MCHC: 34.8 g/dL (ref 30.0–36.0)
MCV: 91.7 fL (ref 80.0–100.0)
Platelets: 229 10*3/uL (ref 150–400)
RBC: 4.57 MIL/uL (ref 4.22–5.81)
RDW: 12.6 % (ref 11.5–15.5)
WBC: 4.5 10*3/uL (ref 4.0–10.5)
nRBC: 0 % (ref 0.0–0.2)

## 2020-08-13 LAB — COMPREHENSIVE METABOLIC PANEL
ALT: 19 U/L (ref 0–44)
AST: 24 U/L (ref 15–41)
Albumin: 4.1 g/dL (ref 3.5–5.0)
Alkaline Phosphatase: 43 U/L (ref 38–126)
Anion gap: 10 (ref 5–15)
BUN: 8 mg/dL (ref 6–20)
CO2: 24 mmol/L (ref 22–32)
Calcium: 9.7 mg/dL (ref 8.9–10.3)
Chloride: 104 mmol/L (ref 98–111)
Creatinine, Ser: 0.97 mg/dL (ref 0.61–1.24)
GFR, Estimated: >60 mL/min (ref >60)
Glucose, Bld: 111 mg/dL — ABNORMAL HIGH (ref 70–99)
Potassium: 3.8 mmol/L (ref 3.5–5.1)
Sodium: 138 mmol/L (ref 135–145)
Total Bilirubin: 1 mg/dL (ref 0.3–1.2)
Total Protein: 6.9 g/dL (ref 6.5–8.1)

## 2020-08-13 LAB — TROPONIN I (HIGH SENSITIVITY): Troponin I (High Sensitivity): 3 ng/L (ref <18)

## 2020-08-13 NOTE — ED Triage Notes (Signed)
Pt bib ems with reports of 3 near syncopal events this morning. Pt reports a heaviness to his chest during these events. BP 170/104, HR 90, 99% RA. Pt arrives in NAD.

## 2020-08-13 NOTE — ED Notes (Signed)
Pt ambulated to the restroom without difficulty. Gait steady and even.

## 2020-08-13 NOTE — Discharge Instructions (Addendum)
It was our pleasure to provide your ER care today - we hope that you feel better.  Rest. Drink plenty of fluids.   Your blood pressure is high this AM - follow up with primary care doctor in the next 1-2 weeks.   Return to ER if worse, new symptoms, fevers, new or severe pain, chest pain, trouble breathing, fainting, or other concern.

## 2020-08-13 NOTE — ED Notes (Signed)
Pt given graham crackers and ginger ale ?

## 2020-08-13 NOTE — ED Notes (Signed)
Patient Alert and oriented to baseline. Stable and ambulatory to baseline. Patient verbalized understanding of the discharge instructions.  Patient belongings were taken by the patient.   

## 2020-08-13 NOTE — ED Provider Notes (Signed)
[facility] EMERGENCY DEPARTMENT Provider Note   CSN: [id number] Arrival date & time: 08/13/20  4967     History Chief Complaint  Patient presents with  . Near Syncope    Glen [name] is a 55 y.o. male.  Patient indicates onset feeling faint earlier today. States this AM had drank coffee x 2, and vitamin, but hadnt eaten. States then noted felt faint, and noted tingling sensation to bilateral hands, and was feeling anxious. States felt lightheaded as if about to faint. No loc or syncope. States then he became more anxious when went work, earlier symptoms recurred, and came to get checked. Also concerned that bp is high. States yesterday felt fine/normal. Denies change in meds/new meds. No fever or chills. No uri symptoms. No headache. No chest pain or discomfort. No abd pain or vomiting/diarrhea. No melena or rectal bleeding. No palpitations. States recent increased stress/anxiety, including about upcoming cervical disc surgery - no acute flare in those symptoms. Currently is feeling improved, no faintness or dizziness.   The history is provided by the patient and a significant other.  Near Syncope Pertinent negatives include no chest pain, no abdominal pain, no headaches and no shortness of breath.       Past Medical History:  Diagnosis Date  . Allergic rhinitis   . Anxiety   . Asthmatic bronchitis   . Dry eyes   . GERD (gastroesophageal reflux disease)   . Hepatitis B infection 2008   recovered  . Hypothyroidism 2010   Dr. Wende Bushy  . Internal hemorrhoids   . Urethritis 2010    Patient Active Problem List   Diagnosis Date Noted  . Neck pain 08/08/2020  . Motion sickness 05/18/2020  . Shoulder pain, left 05/03/2020  . Plantar fasciitis of left foot 01/18/2020  . 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 Dyslipidemia 03/09/2019  . Otitis media 08/04/2018  . Acute sinusitis 08/04/2018  . Anal fissure 01/13/2018  . Shoulder pain, right 01/13/2018  . Ankle  edema 11/25/2017  . Trochanteric bursitis of left hip 11/25/2017  . Cough 08/24/2016  . Wheezing 08/24/2016  . Inflammatory arthritis 07/13/2016  . Primary osteoarthritis of both hands 07/13/2016  . Primary osteoarthritis of both feet 07/13/2016  . Spondylosis of lumbar region without myelopathy or radiculopathy 07/13/2016  . Trochanteric bursitis of both hips 07/13/2016  . History of hepatitis B 07/13/2016  . Acute bronchitis 07/06/2016  . Constipation 07/12/2015  . Vitamin D deficiency 07/12/2015  . Pain of both heels 11/03/2013  . Arthritis pain, hand 11/17/2012  . Mouth sores 11/17/2012  . Blood pressure elevated without history of HTN 07/07/2012  . Palpitations 04/07/2012  . Patellar tendonitis, right 11/07/2011  . Well adult exam 03/05/2011  . ABDOMINAL PAIN, GENERALIZED 06/13/2010  . HEMORRHOIDS 05/22/2010  . RECTAL PAIN 05/22/2010  . HEMATOCHEZIA 05/22/2010  . HYPOGLYCEMIA 12/01/2009  . HYPOGONADISM 12/01/2009  . LOW BACK PAIN, ACUTE 12/01/2009  . HERPES SIMPLEX WITHOUT MENTION OF COMPLICATION 59/16/3846  . PANIC DISORDER 11/24/2009  . DEPRESSIVE DISORDER 11/24/2009  . GERD 09/13/2009  . PES PLANUS 03/16/2009  . REACTIVE AIRWAY DISEASE 03/09/2009  . SNORING 03/09/2009  . Anxiety disorder 02/24/2009  . HYPOTHYROIDISM 10/25/2008  . BACK PAIN 08/23/2008  . Pain in joint, lower leg 12/29/2007  . MRSA INFECTION 01/21/2007  . HEPATITIS B 01/21/2007  . ALLERGIC RHINITIS 01/21/2007  . ABDOMINAL WALL HERNIA 01/21/2007    Past Surgical History:  Procedure Laterality Date  . FACIAL COSMETIC SURGERY    .  LIPOSUCTION    . TEAR DUCT PROBING  2009       Family History  Problem Relation Age of Onset  . Stroke Mother   . Lupus Mother   . Arthritis Mother   . Depression Other   . Hypertension Other   . Stroke Other   . Heart disease Paternal Uncle 70       atrial fib  . Heart disease Paternal Grandfather 50       MI  . Arthritis Maternal Grandmother      Social History   Tobacco Use  . Smoking status: Former Smoker    Packs/day: 1.50    Types: Cigarettes    Quit date: 06/19/1999    Years since quitting: 21.1  . Smokeless tobacco: Never Used  Substance Use Topics  . Alcohol use: Yes  . Drug use: No    Home Medications Prior to Admission medications   Medication Sig Start Date End Date Taking? Authorizing Provider  Cholecalciferol (VITAMIN D3) 2000 units capsule Take 1 capsule (2,000 Units total) by mouth daily. 07/11/15   Plotnikov, Evie Lacks, MD  citalopram (CELEXA) 10 MG tablet Take 1 tablet (10 mg total) by mouth daily. 08/08/20   Plotnikov, Evie Lacks, MD  omeprazole (PRILOSEC) 40 MG capsule Take 1 capsule (40 mg total) by mouth daily. 07/11/15   Plotnikov, Evie Lacks, MD  traMADol (ULTRAM) 50 MG tablet Take 1 tablet (50 mg total) by mouth every 8 (eight) hours as needed for up to 5 days. 08/08/20 08/13/20  Plotnikov, Evie Lacks, MD  valACYclovir (VALTREX) 500 MG tablet TAKE 1 TABLET BY MOUTH TWICE DAILY AS NEEDED FOR FEVER BLISTERS FOR 5 DAYS 02/17/20   Plotnikov, Evie Lacks, MD  ZOMIG 5 MG nasal solution USE ONE DOSE IN NOSTRIL AS NEEDED FOR MIGRAINE 01/25/16   Plotnikov, Evie Lacks, MD    Allergies    Codeine and Penicillins  Review of Systems   Review of Systems  Constitutional: Negative for chills and fever.  HENT: Negative for sore throat.   Eyes: Negative for visual disturbance.  Respiratory: Negative for shortness of breath.   Cardiovascular: Positive for near-syncope. Negative for chest pain, palpitations and leg swelling.  Gastrointestinal: Negative for abdominal pain, diarrhea and vomiting.  Genitourinary: Negative for dysuria and flank pain.  Musculoskeletal: Negative for back pain.  Skin: Negative for rash.  Neurological: Positive for numbness. Negative for speech difficulty, weakness and headaches.  Hematological: Does not bruise/bleed easily.  Psychiatric/Behavioral: Negative for confusion. The patient is  nervous/anxious.     Physical Exam Updated Vital Signs BP (!) 153/95   Pulse 70   Temp 98.3 F (36.8 C) (Oral)   Resp 13   SpO2 98%   Physical Exam Vitals and nursing note reviewed.  Constitutional:      Appearance: Normal appearance. He is well-developed.  HENT:     Head: Atraumatic.     Nose: Nose normal.     Mouth/Throat:     Mouth: Mucous membranes are moist.     Pharynx: Oropharynx is clear.  Eyes:     General: No scleral icterus.    Conjunctiva/sclera: Conjunctivae normal.     Pupils: Pupils are equal, round, and reactive to light.  Neck:     Vascular: No carotid bruit.     Trachea: No tracheal deviation.  Cardiovascular:     Rate and Rhythm: Normal rate and regular rhythm.     Pulses: Normal pulses.     Heart sounds: Normal heart  sounds. No murmur heard. No friction rub. No gallop.   Pulmonary:     Effort: Pulmonary effort is normal. No accessory muscle usage or respiratory distress.     Breath sounds: Normal breath sounds.  Abdominal:     General: Bowel sounds are normal. There is no distension.     Palpations: Abdomen is soft.     Tenderness: There is no abdominal tenderness. There is no guarding.  Genitourinary:    Comments: No cva tenderness. Musculoskeletal:        General: No swelling or tenderness.     Cervical back: Normal range of motion and neck supple. No rigidity.     Right lower leg: No edema.     Left lower leg: No edema.  Skin:    General: Skin is warm and dry.     Findings: No rash.  Neurological:     Mental Status: He is alert.     Comments: Alert, speech clear. Steady gait.   Psychiatric:     Comments: Mildly anxious.      ED Results / Procedures / Treatments   Labs (all labs ordered are listed, but only abnormal results are displayed) Results for orders placed or performed during the hospital encounter of 08/13/20  CBC  Result Value Ref Range   WBC 4.5 4.0 - 10.5 K/uL   RBC 4.57 4.22 - 5.81 MIL/uL   Hemoglobin 14.6 13.0 -  17.0 g/dL   HCT 41.9 39.0 - 52.0 %   MCV 91.7 80.0 - 100.0 fL   MCH 31.9 26.0 - 34.0 pg   MCHC 34.8 30.0 - 36.0 g/dL   RDW 12.6 11.5 - 15.5 %   Platelets 229 150 - 400 K/uL   nRBC 0.0 0.0 - 0.2 %  Comprehensive metabolic panel  Result Value Ref Range   Sodium 138 135 - 145 mmol/L   Potassium 3.8 3.5 - 5.1 mmol/L   Chloride 104 98 - 111 mmol/L   CO2 24 22 - 32 mmol/L   Glucose, Bld 111 (H) 70 - 99 mg/dL   BUN 8 6 - 20 mg/dL   Creatinine, Ser 0.97 0.61 - 1.24 mg/dL   Calcium 9.7 8.9 - 10.3 mg/dL   Total Protein 6.9 6.5 - 8.1 g/dL   Albumin 4.1 3.5 - 5.0 g/dL   AST 24 15 - 41 U/L   ALT 19 0 - 44 U/L   Alkaline Phosphatase 43 38 - 126 U/L   Total Bilirubin 1.0 0.3 - 1.2 mg/dL   GFR, Estimated >60 >60 mL/min   Anion gap 10 5 - 15  Troponin I (High Sensitivity)  Result Value Ref Range   Troponin I (High Sensitivity) 3 <18 ng/L   DG Foot Complete Left  Result Date: 07/29/2020 Please see detailed radiograph report in office note.   EKG EKG Interpretation  Date/Time:  Saturday August 13 2020 09:09:45 EST Ventricular Rate:  92 PR Interval:    QRS Duration: 104 QT Interval:  382 QTC Calculation: 473 R Axis:   61 Text Interpretation: Sinus rhythm Nonspecific T wave abnormality No previous tracing Confirmed by Lajean Saver [phone]) on 08/13/2020 9:20:17 AM   Radiology No results found.  Procedures Procedures   Medications Ordered in ED Medications - No data to display  ED Course  I have reviewed the triage vital signs and the nursing notes.  Pertinent labs & imaging results that were available during my care of the patient were reviewed by me and considered in my medical  decision making (see chart for details).    MDM Rules/Calculators/A&P                         Iv ns. Continuous pulse ox and cardiac monitoring. Ecg. Labs.   Reviewed nursing notes and prior charts for additional history.   Labs reviewed/interpreted by me - chem normal. hgb normal.    Recheck, remains in nsr, no dysrhythmia.  No recurrent dizziness or near syncope.   Po fluids, food, ambulate in hall.   Pt remains asymptomatic and appears stable for d/c.   Rec pcp f/u.  Return precautions provided.    Final Clinical Impression(s) / ED Diagnoses Final diagnoses:  None    Rx / DC Orders ED Discharge Orders    None       Lajean Saver, MD 08/13/20 1128

## 2020-08-14 ENCOUNTER — Encounter: Payer: Self-pay | Admitting: Internal Medicine

## 2020-08-15 ENCOUNTER — Other Ambulatory Visit: Payer: Self-pay

## 2020-08-15 ENCOUNTER — Encounter: Payer: Self-pay | Admitting: Internal Medicine

## 2020-08-15 ENCOUNTER — Ambulatory Visit: Payer: 59 | Admitting: Internal Medicine

## 2020-08-15 VITALS — BP 140/82 | HR 91 | Temp 99.0°F | Ht 74.0 in | Wt 208.2 lb

## 2020-08-15 DIAGNOSIS — F419 Anxiety disorder, unspecified: Secondary | ICD-10-CM

## 2020-08-15 DIAGNOSIS — F410 Panic disorder [episodic paroxysmal anxiety] without agoraphobia: Secondary | ICD-10-CM

## 2020-08-15 DIAGNOSIS — R0789 Other chest pain: Secondary | ICD-10-CM

## 2020-08-15 DIAGNOSIS — E785 Hyperlipidemia, unspecified: Secondary | ICD-10-CM | POA: Diagnosis not present

## 2020-08-15 MED ORDER — CLONAZEPAM 0.5 MG PO TABS: 0.2500 mg | ORAL_TABLET | Freq: Two times a day (BID) | ORAL | 1 refills | Status: DC | PRN

## 2020-08-15 MED ORDER — VALSARTAN 80 MG PO TABS: 80.0000 mg | ORAL_TABLET | Freq: Every day | ORAL | 11 refills | Status: DC

## 2020-08-15 NOTE — Patient Instructions (Signed)
Cardiac CT calcium scoring test $99 Tel # is [phone]   Computed tomography, more commonly known as a CT or CAT scan, is a diagnostic medical imaging test. Like traditional x-rays, it produces multiple images or pictures of the inside of the body. The cross-sectional images generated during a CT scan can be reformatted in multiple planes. They can even generate three-dimensional images. These images can be viewed on a computer monitor, printed on film or by a 3D printer, or transferred to a CD or DVD. CT images of internal organs, bones, soft tissue and blood vessels provide greater detail than traditional x-rays, particularly of soft tissues and blood vessels. A cardiac CT scan for coronary calcium is a non-invasive way of obtaining information about the presence, location and extent of calcified plaque in the coronary arteries--the vessels that supply oxygen-containing blood to the heart muscle. Calcified plaque results when there is a build-up of fat and other substances under the inner layer of the artery. This material can calcify which signals the presence of atherosclerosis, a disease of the vessel wall, also called coronary artery disease (CAD). People with this disease have an increased risk for heart attacks. In addition, over time, progression of plaque build up (CAD) can narrow the arteries or even close off blood flow to the heart. The result may be chest pain, sometimes called "angina," or a heart attack. Because calcium is a marker of CAD, the amount of calcium detected on a cardiac CT scan is a helpful prognostic tool. The findings on cardiac CT are expressed as a calcium score. Another name for this test is coronary artery calcium scoring.  What are some common uses of the procedure? The goal of cardiac CT scan for calcium scoring is to determine if CAD is present and to what extent, even if there are no symptoms. It is a screening study that may be recommended by a physician for  patients with risk factors for CAD but no clinical symptoms. The major risk factors for CAD are: . high blood cholesterol levels  . family history of heart attacks  . diabetes  . high blood pressure  . cigarette smoking  . overweight or obese  . physical inactivity   A negative cardiac CT scan for calcium scoring shows no calcification within the coronary arteries. This suggests that CAD is absent or so minimal it cannot be seen by this technique. The chance of having a heart attack over the next two to five years is very low under these circumstances. A positive test means that CAD is present, regardless of whether or not the patient is experiencing any symptoms. The amount of calcification--expressed as the calcium score--may help to predict the likelihood of a myocardial infarction (heart attack) in the coming years and helps your medical doctor or cardiologist decide whether the patient may need to take preventive medicine or undertake other measures such as diet and exercise to lower the risk for heart attack. The extent of CAD is graded according to your calcium score:  Calcium Score  Presence of CAD (coronary artery disease)  0 No evidence of CAD   1-10 Minimal evidence of CAD  11-100 Mild evidence of CAD  101-400 Moderate evidence of CAD  Over 400 Extensive evidence of CAD    

## 2020-08-15 NOTE — Progress Notes (Signed)
Subjective:  Patient ID: Glen [name], male    DOB: 26-Dec-1965  Age: 55 y.o. MRN: [id number]  CC: Hypertension (F/u from ER. Pt states he went on Sat. He states he felt like he was going to pass out)   HPI Glen [name] presents for CP, near-syncope on 2/26 -he woke up the morning feeling well.  He is sx's started after he took vitamins on empty stomach.  She started to feel tightness in his chest spreading into his arms and weakness.  He ate something and felt better.  He took a shower and went to work.  At work he had the second wave of feeling weak and tight in the chest and arms, like she would be able to hold things.  He was pale and trembling.  He felt panicky.  He was lightheaded and dizzy.  Ambulance was called and he was taken to the hospital.  ER evaluation was negative and she was discharged home.  ER notes, tests reviewed  Outpatient Medications Prior to Visit  Medication Sig Dispense Refill  . acetaminophen (TYLENOL) 325 MG tablet Take 325 mg by mouth daily as needed for mild pain.    . Cholecalciferol (VITAMIN D3) 2000 units capsule Take 1 capsule (2,000 Units total) by mouth daily. 100 capsule 3  . citalopram (CELEXA) 10 MG tablet Take 1 tablet (10 mg total) by mouth daily. 90 tablet 3  . Ginger, Zingiber officinalis, (GINGER PO) Take 1 tablet by mouth daily.    Marland Kitchen omeprazole (PRILOSEC) 40 MG capsule Take 1 capsule (40 mg total) by mouth daily. 30 capsule 3  . PSYLLIUM PO Take 1 tablet by mouth daily.    . Turmeric (QC TUMERIC COMPLEX PO) Take 1 tablet by mouth daily.    . valACYclovir (VALTREX) 500 MG tablet TAKE 1 TABLET BY MOUTH TWICE DAILY AS NEEDED FOR FEVER BLISTERS FOR 5 DAYS (Patient taking differently: Take 500 mg by mouth daily as needed (fever blisters).) 30 tablet 0  . ZOMIG 5 MG nasal solution USE ONE DOSE IN NOSTRIL AS NEEDED FOR MIGRAINE 6 Units 3   No facility-administered medications prior to visit.    ROS: Review of Systems  Constitutional: Negative for  appetite change, fatigue and unexpected weight change.  HENT: Negative for congestion, nosebleeds, sneezing, sore throat and trouble swallowing.   Eyes: Negative for itching and visual disturbance.  Respiratory: Negative for cough.   Cardiovascular: Negative for chest pain, palpitations and leg swelling.  Gastrointestinal: Negative for abdominal distention, blood in stool, diarrhea and nausea.  Genitourinary: Negative for frequency and hematuria.  Musculoskeletal: Negative for back pain, gait problem, joint swelling and neck pain.  Skin: Negative for rash.  Neurological: Negative for dizziness, tremors, speech difficulty and weakness.  Psychiatric/Behavioral: Negative for agitation, dysphoric mood and sleep disturbance. The patient is nervous/anxious.     Objective:  BP 140/82 (BP Location: Left Arm)   Pulse 91   Temp 99 F (37.2 C) (Oral)   Ht 6\' 2"  (1.88 m)   Wt 208 lb 3.2 oz (94.4 kg)   SpO2 98%   BMI 26.73 kg/m   BP Readings from Last 3 Encounters:  08/15/20 140/82  08/13/20 (!) 157/90  08/08/20 138/84    Wt Readings from Last 3 Encounters:  08/15/20 208 lb 3.2 oz (94.4 kg)  08/08/20 210 lb 3.2 oz (95.3 kg)  05/18/20 209 lb 3.2 oz (94.9 kg)    Physical Exam Constitutional:      General: He is  not in acute distress.    Appearance: He is well-developed.     Comments: NAD  HENT:     Mouth/Throat:     Mouth: Oropharynx is clear and moist.  Eyes:     Conjunctiva/sclera: Conjunctivae normal.     Pupils: Pupils are equal, round, and reactive to light.  Neck:     Thyroid: No thyromegaly.     Vascular: No JVD.  Cardiovascular:     Rate and Rhythm: Normal rate and regular rhythm.     Pulses: Intact distal pulses.     Heart sounds: Normal heart sounds. No murmur heard. No friction rub. No gallop.   Pulmonary:     Effort: Pulmonary effort is normal. No respiratory distress.     Breath sounds: Normal breath sounds. No wheezing or rales.  Chest:     Chest wall: No  tenderness.  Abdominal:     General: Bowel sounds are normal. There is no distension.     Palpations: Abdomen is soft. There is no mass.     Tenderness: There is no abdominal tenderness. There is no guarding or rebound.  Musculoskeletal:        General: No tenderness or edema. Normal range of motion.     Cervical back: Normal range of motion.  Lymphadenopathy:     Cervical: No cervical adenopathy.  Skin:    General: Skin is warm and dry.     Findings: No rash.  Neurological:     Mental Status: He is alert and oriented to person, place, and time.     Cranial Nerves: No cranial nerve deficit.     Motor: No abnormal muscle tone.     Coordination: He displays a negative Romberg sign. Coordination normal.     Gait: Gait normal.     Deep Tendon Reflexes: Reflexes are normal and symmetric.  Psychiatric:        Mood and Affect: Mood and affect normal.        Behavior: Behavior normal.        Thought Content: Thought content normal.        Judgment: Judgment normal.     Lab Results  Component Value Date   WBC 4.5 08/13/2020   HGB 14.6 08/13/2020   HCT 41.9 08/13/2020   PLT 229 08/13/2020   GLUCOSE 111 (H) 08/13/2020   CHOL 198 08/08/2020   TRIG 66.0 08/08/2020   HDL 53.40 08/08/2020   LDLDIRECT 150.6 06/29/2013   LDLCALC 132 (H) 08/08/2020   ALT 19 08/13/2020   AST 24 08/13/2020   NA 138 08/13/2020   K 3.8 08/13/2020   CL 104 08/13/2020   CREATININE 0.97 08/13/2020   BUN 8 08/13/2020   CO2 24 08/13/2020   TSH 1.11 08/08/2020   PSA 1.08 08/08/2020   HGBA1C 5.3 12/02/2009    No results found.  Assessment & Plan:    Walker Kehr, MD

## 2020-08-16 DIAGNOSIS — R0789 Other chest pain: Secondary | ICD-10-CM | POA: Insufficient documentation

## 2020-08-16 NOTE — Assessment & Plan Note (Signed)
Glen [name] is sensitive to benzodiazepines.  The make him sleepy.  We will try clonazepam 0.5 mg 1/4-1/2 tablet as needed.  Continue citalopram

## 2020-08-16 NOTE — Assessment & Plan Note (Signed)
likely due to esophageal spasm.  ER work-up was negative

## 2020-08-16 NOTE — Assessment & Plan Note (Signed)
Obtain a calcium CT scan for calcium scoring

## 2020-08-17 NOTE — Telephone Encounter (Signed)
Duplicate requests. Pt has sent several mychart msg's and has called requesting xanax. MD has ALL msg's... closing this encounter.Marland KitchenJohny Chess

## 2020-08-17 NOTE — Telephone Encounter (Signed)
   Patient calling to report can not take clonazePAM (KLONOPIN) 0.5 MG tablet  Requesting Xanax  Please call

## 2020-08-19 ENCOUNTER — Other Ambulatory Visit: Payer: Self-pay | Admitting: Internal Medicine

## 2020-08-19 MED ORDER — ALPRAZOLAM 0.25 MG PO TABS: 0.2500 mg | ORAL_TABLET | Freq: Two times a day (BID) | ORAL | 3 refills | Status: DC | PRN

## 2020-08-22 ENCOUNTER — Ambulatory Visit: Payer: 59 | Admitting: Internal Medicine

## 2020-08-22 ENCOUNTER — Encounter: Payer: Self-pay | Admitting: Internal Medicine

## 2020-08-22 ENCOUNTER — Other Ambulatory Visit: Payer: Self-pay

## 2020-08-22 DIAGNOSIS — R030 Elevated blood-pressure reading, without diagnosis of hypertension: Secondary | ICD-10-CM | POA: Diagnosis not present

## 2020-08-22 DIAGNOSIS — F410 Panic disorder [episodic paroxysmal anxiety] without agoraphobia: Secondary | ICD-10-CM | POA: Diagnosis not present

## 2020-08-22 DIAGNOSIS — F419 Anxiety disorder, unspecified: Secondary | ICD-10-CM

## 2020-08-22 MED ORDER — CITALOPRAM HYDROBROMIDE 20 MG PO TABS: 20.0000 mg | ORAL_TABLET | Freq: Every day | ORAL | 3 refills | Status: DC

## 2020-08-22 NOTE — Progress Notes (Signed)
Subjective:  Patient ID: Glen [name], male    DOB: 11/19/1965  Age: 55 y.o. MRN: [id number]  CC: Follow-up and Anxiety   HPI Glen [name] presents for anxiety and panic attacks.   Glen [name] has gotten insight from his clients that his panic attacks problem was likely related to a probable tramadol withdrawal. Pt stopped Tramadol abruptly after he was taking it 3 times a day on schedule.  He stopped tramadol because he had no pain.  He started to use Tylenol.  His anxiety symptoms developed shortly after.  F/u elevated BP SBP 130-137 lately   Outpatient Medications Prior to Visit  Medication Sig Dispense Refill  . acetaminophen (TYLENOL) 650 MG CR tablet Take 650 mg by mouth every 8 (eight) hours as needed for pain.    Marland Kitchen ALPRAZolam (XANAX) 0.25 MG tablet Take 1 tablet (0.25 mg total) by mouth 2 (two) times daily as needed for anxiety (Panic attacks). 60 tablet 3  . citalopram (CELEXA) 10 MG tablet Take 1 tablet (10 mg total) by mouth daily. (Patient taking differently: Take 20 mg by mouth daily.) 90 tablet 3  . omeprazole (PRILOSEC) 40 MG capsule Take 1 capsule (40 mg total) by mouth daily. 30 capsule 3  . traMADol (ULTRAM) 50 MG tablet Take 50 mg by mouth 2 (two) times daily as needed for severe pain.    . valACYclovir (VALTREX) 500 MG tablet TAKE 1 TABLET BY MOUTH TWICE DAILY AS NEEDED FOR FEVER BLISTERS FOR 5 DAYS (Patient taking differently: Take 500 mg by mouth daily as needed (fever blisters).) 30 tablet 0  . valsartan (DIOVAN) 80 MG tablet Take 1 tablet (80 mg total) by mouth daily. 30 tablet 11  . ZOMIG 5 MG nasal solution USE ONE DOSE IN NOSTRIL AS NEEDED FOR MIGRAINE (Patient taking differently: Place 1 spray into the nose as needed for migraine.) 6 Units 3  . Cholecalciferol (VITAMIN D3) 2000 units capsule Take 1 capsule (2,000 Units total) by mouth daily. (Patient not taking: Reported on 08/22/2020) 100 capsule 3  . Ginger, Zingiber officinalis, (GINGER PO) Take 1 tablet by  mouth daily. (Patient not taking: Reported on 08/22/2020)    . Turmeric (QC TUMERIC COMPLEX PO) Take 1 tablet by mouth daily. (Patient not taking: Reported on 08/22/2020)     No facility-administered medications prior to visit.    ROS: Review of Systems  Constitutional: Negative for appetite change, fatigue and unexpected weight change.  HENT: Negative for congestion, nosebleeds, sneezing, sore throat and trouble swallowing.   Eyes: Negative for itching and visual disturbance.  Respiratory: Negative for cough.   Cardiovascular: Negative for chest pain, palpitations and leg swelling.  Gastrointestinal: Negative for abdominal distention, blood in stool, diarrhea and nausea.  Genitourinary: Negative for frequency and hematuria.  Musculoskeletal: Positive for neck pain. Negative for back pain, gait problem and joint swelling.  Skin: Negative for rash.  Neurological: Negative for dizziness, tremors, speech difficulty and weakness.  Psychiatric/Behavioral: Negative for agitation, dysphoric mood and sleep disturbance. The patient is nervous/anxious.     Objective:  BP 138/84 (BP Location: Left Arm)   Pulse 78   Temp 99.4 F (37.4 C) (Oral)   Ht 6\' 2"  (1.88 m)   Wt 207 lb 3.2 oz (94 kg)   SpO2 98%   BMI 26.60 kg/m   BP Readings from Last 3 Encounters:  08/22/20 138/84  08/15/20 140/82  08/13/20 (!) 157/90    Wt Readings from Last 3 Encounters:  08/22/20 207 lb  3.2 oz (94 kg)  08/15/20 208 lb 3.2 oz (94.4 kg)  08/08/20 210 lb 3.2 oz (95.3 kg)    Physical Exam Constitutional:      General: He is not in acute distress.    Appearance: He is well-developed.     Comments: NAD  HENT:     Mouth/Throat:     Mouth: Oropharynx is clear and moist.  Eyes:     Conjunctiva/sclera: Conjunctivae normal.     Pupils: Pupils are equal, round, and reactive to light.  Neck:     Thyroid: No thyromegaly.     Vascular: No JVD.  Cardiovascular:     Rate and Rhythm: Normal rate and regular  rhythm.     Pulses: Intact distal pulses.     Heart sounds: Normal heart sounds. No murmur heard. No friction rub. No gallop.   Pulmonary:     Effort: Pulmonary effort is normal. No respiratory distress.     Breath sounds: Normal breath sounds. No wheezing or rales.  Chest:     Chest wall: No tenderness.  Abdominal:     General: Bowel sounds are normal. There is no distension.     Palpations: Abdomen is soft. There is no mass.     Tenderness: There is no abdominal tenderness. There is no guarding or rebound.  Musculoskeletal:        General: No tenderness or edema. Normal range of motion.     Cervical back: Normal range of motion.  Lymphadenopathy:     Cervical: No cervical adenopathy.  Skin:    General: Skin is warm and dry.     Findings: No rash.  Neurological:     Mental Status: He is alert and oriented to person, place, and time.     Cranial Nerves: No cranial nerve deficit.     Motor: No abnormal muscle tone.     Coordination: He displays a negative Romberg sign. Coordination normal.     Gait: Gait normal.     Deep Tendon Reflexes: Reflexes are normal and symmetric.  Psychiatric:        Mood and Affect: Mood and affect normal.        Behavior: Behavior normal.        Thought Content: Thought content normal.        Judgment: Judgment normal.       Lab Results  Component Value Date   WBC 4.5 08/13/2020   HGB 14.6 08/13/2020   HCT 41.9 08/13/2020   PLT 229 08/13/2020   GLUCOSE 111 (H) 08/13/2020   CHOL 198 08/08/2020   TRIG 66.0 08/08/2020   HDL 53.40 08/08/2020   LDLDIRECT 150.6 06/29/2013   LDLCALC 132 (H) 08/08/2020   ALT 19 08/13/2020   AST 24 08/13/2020   NA 138 08/13/2020   K 3.8 08/13/2020   CL 104 08/13/2020   CREATININE 0.97 08/13/2020   BUN 8 08/13/2020   CO2 24 08/13/2020   TSH 1.11 08/08/2020   PSA 1.08 08/08/2020   HGBA1C 5.3 12/02/2009    No results found.  Assessment & Plan:    Walker Kehr, MD

## 2020-08-22 NOTE — Assessment & Plan Note (Addendum)
Probable tramadol withdrawal triggering panic attacks. Cont w/Citalopram 20 mg/d now  Glen [name] has gotten insight from his clients that his panic attacks problem was likely related to a probable tramadol withdrawal. Pt stopped Tramadol abruptly after he was taking it 3 times a day on schedule.  He stopped tramadol because he had no pain.  He started to use Tylenol.  His anxiety symptoms developed shortly after.

## 2020-08-22 NOTE — Assessment & Plan Note (Addendum)
Probable tramadol withdrawal triggering the attack

## 2020-08-25 NOTE — Assessment & Plan Note (Addendum)
Better on Valsartan

## 2020-08-29 ENCOUNTER — Other Ambulatory Visit (HOSPITAL_COMMUNITY)
Admission: RE | Admit: 2020-08-29 | Discharge: 2020-08-29 | Disposition: A | Discharge status: Home / Self Care | Payer: 59 | Source: Ambulatory Visit | Attending: Neurosurgery | Admitting: Neurosurgery

## 2020-08-29 DIAGNOSIS — Z20822 Contact with and (suspected) exposure to covid-19: Secondary | ICD-10-CM | POA: Diagnosis not present

## 2020-08-29 DIAGNOSIS — Z01812 Encounter for preprocedural laboratory examination: Secondary | ICD-10-CM | POA: Insufficient documentation

## 2020-08-29 LAB — SARS CORONAVIRUS 2 (TAT 6-24 HRS): SARS Coronavirus 2: NEGATIVE

## 2020-08-30 ENCOUNTER — Encounter (HOSPITAL_COMMUNITY): Payer: Self-pay | Admitting: Neurosurgery

## 2020-08-30 NOTE — Progress Notes (Signed)
Spoke with pt for pre-op call. Pt recently started Valsartan due to BP being high when he had a reaction for stopping Tramadol suddenly". Had never had high BP prior to this episode. Pt denies cardiac history or Diabetes. Pt states he is "very" sensitive to strong pain medications.   Covid test done 08/29/20 and it's negative. Pt states he's been in quarantine since the test was done and understands that he stays in quarantine until he comes to the hospital tomorrow.

## 2020-08-31 ENCOUNTER — Encounter (HOSPITAL_COMMUNITY): Payer: Self-pay | Admitting: Neurosurgery

## 2020-08-31 ENCOUNTER — Ambulatory Visit (HOSPITAL_COMMUNITY)
Admission: RE | Admit: 2020-08-31 | Discharge: 2020-09-01 | Disposition: A | Discharge status: Home / Self Care | Payer: 59 | Attending: Neurosurgery | Admitting: Neurosurgery

## 2020-08-31 ENCOUNTER — Ambulatory Visit (HOSPITAL_COMMUNITY): Payer: 59

## 2020-08-31 ENCOUNTER — Ambulatory Visit (HOSPITAL_COMMUNITY)
Admission: RE | Disposition: A | Discharge status: Home / Self Care | Payer: Self-pay | Source: Home / Self Care | Attending: Neurosurgery

## 2020-08-31 ENCOUNTER — Other Ambulatory Visit: Payer: Self-pay

## 2020-08-31 DIAGNOSIS — Z79899 Other long term (current) drug therapy: Secondary | ICD-10-CM | POA: Diagnosis not present

## 2020-08-31 DIAGNOSIS — M50122 Cervical disc disorder at C5-C6 level with radiculopathy: Secondary | ICD-10-CM | POA: Diagnosis not present

## 2020-08-31 DIAGNOSIS — Z87891 Personal history of nicotine dependence: Secondary | ICD-10-CM | POA: Diagnosis not present

## 2020-08-31 DIAGNOSIS — M50123 Cervical disc disorder at C6-C7 level with radiculopathy: Secondary | ICD-10-CM | POA: Insufficient documentation

## 2020-08-31 DIAGNOSIS — M4722 Other spondylosis with radiculopathy, cervical region: Secondary | ICD-10-CM | POA: Diagnosis not present

## 2020-08-31 DIAGNOSIS — M4802 Spinal stenosis, cervical region: Secondary | ICD-10-CM | POA: Diagnosis not present

## 2020-08-31 DIAGNOSIS — Z419 Encounter for procedure for purposes other than remedying health state, unspecified: Secondary | ICD-10-CM

## 2020-08-31 HISTORY — DX: Unspecified osteoarthritis, unspecified site: M19.90

## 2020-08-31 HISTORY — DX: Headache, unspecified: R51.9

## 2020-08-31 HISTORY — DX: Other specified postprocedural states: R11.2

## 2020-08-31 HISTORY — DX: Nausea with vomiting, unspecified: R11.2

## 2020-08-31 HISTORY — DX: Other specified postprocedural states: Z98.890

## 2020-08-31 HISTORY — PX: ANTERIOR CERVICAL DECOMP/DISCECTOMY FUSION: SHX1161

## 2020-08-31 LAB — CBC
HCT: 43.9 % (ref 39.0–52.0)
Hemoglobin: 15.1 g/dL (ref 13.0–17.0)
MCH: 31.7 pg (ref 26.0–34.0)
MCHC: 34.4 g/dL (ref 30.0–36.0)
MCV: 92 fL (ref 80.0–100.0)
Platelets: 247 10*3/uL (ref 150–400)
RBC: 4.77 MIL/uL (ref 4.22–5.81)
RDW: 12.2 % (ref 11.5–15.5)
WBC: 5.1 10*3/uL (ref 4.0–10.5)
nRBC: 0 % (ref 0.0–0.2)

## 2020-08-31 LAB — COMPREHENSIVE METABOLIC PANEL
ALT: 19 U/L (ref 0–44)
AST: 22 U/L (ref 15–41)
Albumin: 3.9 g/dL (ref 3.5–5.0)
Alkaline Phosphatase: 43 U/L (ref 38–126)
Anion gap: 6 (ref 5–15)
BUN: 10 mg/dL (ref 6–20)
CO2: 26 mmol/L (ref 22–32)
Calcium: 9.4 mg/dL (ref 8.9–10.3)
Chloride: 105 mmol/L (ref 98–111)
Creatinine, Ser: 0.85 mg/dL (ref 0.61–1.24)
GFR, Estimated: >60 mL/min (ref >60)
Glucose, Bld: 100 mg/dL — ABNORMAL HIGH (ref 70–99)
Potassium: 4.1 mmol/L (ref 3.5–5.1)
Sodium: 137 mmol/L (ref 135–145)
Total Bilirubin: 1.1 mg/dL (ref 0.3–1.2)
Total Protein: 6.5 g/dL (ref 6.5–8.1)

## 2020-08-31 LAB — TYPE AND SCREEN
ABO/RH(D): A POS
Antibody Screen: NEGATIVE

## 2020-08-31 LAB — ABO/RH: ABO/RH(D): A POS

## 2020-08-31 SURGERY — ANTERIOR CERVICAL DECOMPRESSION/DISCECTOMY FUSION 2 LEVELS
Anesthesia: General

## 2020-08-31 MED ORDER — ALUM & MAG HYDROXIDE-SIMETH 200-200-20 MG/5ML PO SUSP: 30.0000 mL | Freq: Four times a day (QID) | ORAL | Status: DC | PRN

## 2020-08-31 MED ORDER — ACETAMINOPHEN 10 MG/ML IV SOLN
INTRAVENOUS | Status: AC
  Filled 2020-08-31: qty 100

## 2020-08-31 MED ORDER — ZOLMITRIPTAN 5 MG NA SOLN: 1.0000 | NASAL | Status: DC | PRN

## 2020-08-31 MED ORDER — AMISULPRIDE (ANTIEMETIC) 5 MG/2ML IV SOLN: 10.0000 mg | Freq: Once | INTRAVENOUS | Status: DC | PRN

## 2020-08-31 MED ORDER — ONDANSETRON HCL 4 MG/2ML IJ SOLN
INTRAMUSCULAR | Status: DC | PRN
  Administered 2020-08-31: 4 mg via INTRAVENOUS

## 2020-08-31 MED ORDER — THROMBIN 5000 UNITS EX KIT
PACK | CUTANEOUS | Status: AC
  Filled 2020-08-31: qty 1

## 2020-08-31 MED ORDER — THROMBIN 5000 UNITS EX SOLR
OROMUCOSAL | Status: DC | PRN
  Administered 2020-08-31 (×2): 5 mL via TOPICAL

## 2020-08-31 MED ORDER — BUPIVACAINE-EPINEPHRINE (PF) 0.25% -1:200000 IJ SOLN
INTRAMUSCULAR | Status: DC | PRN
  Administered 2020-08-31: 10 mL

## 2020-08-31 MED ORDER — ONDANSETRON HCL 4 MG/2ML IJ SOLN
INTRAMUSCULAR | Status: AC
  Filled 2020-08-31: qty 2

## 2020-08-31 MED ORDER — PANTOPRAZOLE SODIUM 40 MG PO TBEC
40.0000 mg | DELAYED_RELEASE_TABLET | Freq: Every day | ORAL | Status: DC
  Administered 2020-08-31: 40 mg via ORAL
  Filled 2020-08-31: qty 1

## 2020-08-31 MED ORDER — VANCOMYCIN HCL 1000 MG/200ML IV SOLN
1000.0000 mg | Freq: Once | INTRAVENOUS | Status: AC
  Administered 2020-08-31: 1000 mg via INTRAVENOUS
  Filled 2020-08-31: qty 200

## 2020-08-31 MED ORDER — KETOROLAC TROMETHAMINE 30 MG/ML IJ SOLN
30.0000 mg | Freq: Once | INTRAMUSCULAR | Status: AC
  Administered 2020-08-31: 30 mg via INTRAVENOUS

## 2020-08-31 MED ORDER — LACTATED RINGERS IV SOLN: INTRAVENOUS | Status: DC

## 2020-08-31 MED ORDER — CHLORHEXIDINE GLUCONATE 0.12 % MT SOLN: 15.0000 mL | Freq: Once | OROMUCOSAL | Status: AC

## 2020-08-31 MED ORDER — PROPOFOL 10 MG/ML IV BOLUS
INTRAVENOUS | Status: DC | PRN
  Administered 2020-08-31: 200 mg via INTRAVENOUS

## 2020-08-31 MED ORDER — FENTANYL CITRATE (PF) 100 MCG/2ML IJ SOLN
25.0000 ug | INTRAMUSCULAR | Status: DC | PRN
Start: 2020-08-31 — End: 2020-08-31

## 2020-08-31 MED ORDER — DEXAMETHASONE SODIUM PHOSPHATE 4 MG/ML IJ SOLN
4.0000 mg | Freq: Four times a day (QID) | INTRAMUSCULAR | Status: AC
  Administered 2020-08-31: 4 mg via INTRAVENOUS
  Filled 2020-08-31: qty 1

## 2020-08-31 MED ORDER — PHENOL 1.4 % MT LIQD: 1.0000 | OROMUCOSAL | Status: DC | PRN

## 2020-08-31 MED ORDER — ONDANSETRON HCL 4 MG PO TABS: 4.0000 mg | ORAL_TABLET | Freq: Four times a day (QID) | ORAL | Status: DC | PRN

## 2020-08-31 MED ORDER — ACETAMINOPHEN 325 MG PO TABS: 650.0000 mg | ORAL_TABLET | ORAL | Status: DC | PRN

## 2020-08-31 MED ORDER — CHLORHEXIDINE GLUCONATE CLOTH 2 % EX PADS: 6.0000 | MEDICATED_PAD | Freq: Once | CUTANEOUS | Status: DC

## 2020-08-31 MED ORDER — BUPIVACAINE-EPINEPHRINE (PF) 0.25% -1:200000 IJ SOLN
INTRAMUSCULAR | Status: AC
  Filled 2020-08-31: qty 20

## 2020-08-31 MED ORDER — DEXAMETHASONE SODIUM PHOSPHATE 10 MG/ML IJ SOLN
INTRAMUSCULAR | Status: AC
  Filled 2020-08-31: qty 1

## 2020-08-31 MED ORDER — ROCURONIUM BROMIDE 10 MG/ML (PF) SYRINGE
PREFILLED_SYRINGE | INTRAVENOUS | Status: DC | PRN
  Administered 2020-08-31: 100 mg via INTRAVENOUS
  Administered 2020-08-31 (×2): 30 mg via INTRAVENOUS
  Administered 2020-08-31: 40 mg via INTRAVENOUS

## 2020-08-31 MED ORDER — IRBESARTAN 150 MG PO TABS
75.0000 mg | ORAL_TABLET | Freq: Every day | ORAL | Status: DC
  Administered 2020-08-31: 75 mg via ORAL
  Filled 2020-08-31: qty 1

## 2020-08-31 MED ORDER — ORAL CARE MOUTH RINSE: 15.0000 mL | Freq: Once | OROMUCOSAL | Status: AC

## 2020-08-31 MED ORDER — MORPHINE SULFATE (PF) 4 MG/ML IV SOLN
4.0000 mg | INTRAVENOUS | Status: DC | PRN
Start: 2020-08-31 — End: 2020-09-01

## 2020-08-31 MED ORDER — PROPOFOL 1000 MG/100ML IV EMUL
INTRAVENOUS | Status: AC
  Filled 2020-08-31: qty 100

## 2020-08-31 MED ORDER — ONDANSETRON HCL 4 MG/2ML IJ SOLN: 4.0000 mg | Freq: Four times a day (QID) | INTRAMUSCULAR | Status: DC | PRN

## 2020-08-31 MED ORDER — ACETAMINOPHEN 650 MG RE SUPP: 650.0000 mg | RECTAL | Status: DC | PRN

## 2020-08-31 MED ORDER — VANCOMYCIN HCL IN DEXTROSE 1-5 GM/200ML-% IV SOLN
1000.0000 mg | INTRAVENOUS | Status: AC
  Administered 2020-08-31: 1000 mg via INTRAVENOUS
  Filled 2020-08-31: qty 200

## 2020-08-31 MED ORDER — BACITRACIN ZINC 500 UNIT/GM EX OINT
TOPICAL_OINTMENT | CUTANEOUS | Status: AC
  Filled 2020-08-31: qty 28.35

## 2020-08-31 MED ORDER — BISACODYL 10 MG RE SUPP: 10.0000 mg | Freq: Every day | RECTAL | Status: DC | PRN

## 2020-08-31 MED ORDER — PHENYLEPHRINE HCL-NACL 10-0.9 MG/250ML-% IV SOLN: INTRAVENOUS | Status: DC | PRN

## 2020-08-31 MED ORDER — CHLORHEXIDINE GLUCONATE 0.12 % MT SOLN
OROMUCOSAL | Status: AC
  Administered 2020-08-31: 15 mL via OROMUCOSAL
  Filled 2020-08-31: qty 15

## 2020-08-31 MED ORDER — HYDROMORPHONE HCL 2 MG PO TABS: 2.0000 mg | ORAL_TABLET | ORAL | Status: DC | PRN

## 2020-08-31 MED ORDER — SUGAMMADEX SODIUM 200 MG/2ML IV SOLN
INTRAVENOUS | Status: DC | PRN
  Administered 2020-08-31: 400 mg via INTRAVENOUS

## 2020-08-31 MED ORDER — PROPOFOL 10 MG/ML IV BOLUS
INTRAVENOUS | Status: AC
  Filled 2020-08-31: qty 20

## 2020-08-31 MED ORDER — DEXAMETHASONE 4 MG PO TABS
4.0000 mg | ORAL_TABLET | Freq: Four times a day (QID) | ORAL | Status: AC
  Administered 2020-08-31: 4 mg via ORAL
  Filled 2020-08-31: qty 1

## 2020-08-31 MED ORDER — LIDOCAINE 2% (20 MG/ML) 5 ML SYRINGE
INTRAMUSCULAR | Status: DC | PRN
  Administered 2020-08-31: 100 mg via INTRAVENOUS

## 2020-08-31 MED ORDER — DOCUSATE SODIUM 100 MG PO CAPS
100.0000 mg | ORAL_CAPSULE | Freq: Two times a day (BID) | ORAL | Status: DC
  Administered 2020-08-31: 100 mg via ORAL
  Filled 2020-08-31: qty 1

## 2020-08-31 MED ORDER — THROMBIN (RECOMBINANT) 5000 UNITS EX SOLR
CUTANEOUS | Status: AC
  Filled 2020-08-31: qty 5000

## 2020-08-31 MED ORDER — PROPOFOL 500 MG/50ML IV EMUL
INTRAVENOUS | Status: DC | PRN
  Administered 2020-08-31: 150 ug/kg/min via INTRAVENOUS

## 2020-08-31 MED ORDER — SCOPOLAMINE 1 MG/3DAYS TD PT72
1.0000 | MEDICATED_PATCH | TRANSDERMAL | Status: DC
  Administered 2020-08-31: 1.5 mg via TRANSDERMAL
  Filled 2020-08-31: qty 1

## 2020-08-31 MED ORDER — FENTANYL CITRATE (PF) 100 MCG/2ML IJ SOLN
INTRAMUSCULAR | Status: DC | PRN
  Administered 2020-08-31: 100 ug via INTRAVENOUS

## 2020-08-31 MED ORDER — PANTOPRAZOLE SODIUM 40 MG PO TBEC: 80.0000 mg | DELAYED_RELEASE_TABLET | Freq: Every day | ORAL | Status: DC

## 2020-08-31 MED ORDER — FENTANYL CITRATE (PF) 250 MCG/5ML IJ SOLN
INTRAMUSCULAR | Status: AC
  Filled 2020-08-31: qty 5

## 2020-08-31 MED ORDER — ACETAMINOPHEN 500 MG PO TABS
1000.0000 mg | ORAL_TABLET | Freq: Once | ORAL | Status: AC
  Administered 2020-08-31: 1000 mg via ORAL
  Filled 2020-08-31: qty 2

## 2020-08-31 MED ORDER — ACETAMINOPHEN 10 MG/ML IV SOLN
1000.0000 mg | Freq: Once | INTRAVENOUS | Status: AC
  Administered 2020-08-31: 1000 mg via INTRAVENOUS

## 2020-08-31 MED ORDER — CELECOXIB 200 MG PO CAPS
200.0000 mg | ORAL_CAPSULE | Freq: Once | ORAL | Status: AC
  Administered 2020-08-31: 200 mg via ORAL
  Filled 2020-08-31: qty 1

## 2020-08-31 MED ORDER — ALPRAZOLAM 0.25 MG PO TABS: 0.2500 mg | ORAL_TABLET | Freq: Two times a day (BID) | ORAL | Status: DC | PRN

## 2020-08-31 MED ORDER — MENTHOL 3 MG MT LOZG: 1.0000 | LOZENGE | OROMUCOSAL | Status: DC | PRN

## 2020-08-31 MED ORDER — 0.9 % SODIUM CHLORIDE (POUR BTL) OPTIME
TOPICAL | Status: DC | PRN
  Administered 2020-08-31: 1000 mL

## 2020-08-31 MED ORDER — VITAMIN D 25 MCG (1000 UNIT) PO TABS
2000.0000 [IU] | ORAL_TABLET | Freq: Every day | ORAL | Status: DC
  Administered 2020-08-31: 2000 [IU] via ORAL
  Filled 2020-08-31: qty 2

## 2020-08-31 MED ORDER — ACETAMINOPHEN 500 MG PO TABS
1000.0000 mg | ORAL_TABLET | Freq: Four times a day (QID) | ORAL | Status: DC
  Administered 2020-08-31 – 2020-09-01 (×2): 1000 mg via ORAL
  Filled 2020-08-31 (×3): qty 2

## 2020-08-31 MED ORDER — DEXAMETHASONE SODIUM PHOSPHATE 10 MG/ML IJ SOLN
INTRAMUSCULAR | Status: DC | PRN
  Administered 2020-08-31: 10 mg via INTRAVENOUS

## 2020-08-31 MED ORDER — PHENYLEPHRINE 40 MCG/ML (10ML) SYRINGE FOR IV PUSH (FOR BLOOD PRESSURE SUPPORT)
PREFILLED_SYRINGE | INTRAVENOUS | Status: DC | PRN
  Administered 2020-08-31 (×2): 80 ug via INTRAVENOUS

## 2020-08-31 MED ORDER — PANTOPRAZOLE SODIUM 40 MG IV SOLR: 40.0000 mg | Freq: Every day | INTRAVENOUS | Status: DC

## 2020-08-31 MED ORDER — MIDAZOLAM HCL 2 MG/2ML IJ SOLN
INTRAMUSCULAR | Status: DC | PRN
  Administered 2020-08-31: 2 mg via INTRAVENOUS

## 2020-08-31 MED ORDER — MIDAZOLAM HCL 2 MG/2ML IJ SOLN
INTRAMUSCULAR | Status: AC
  Filled 2020-08-31: qty 2

## 2020-08-31 MED ORDER — ROCURONIUM BROMIDE 10 MG/ML (PF) SYRINGE
PREFILLED_SYRINGE | INTRAVENOUS | Status: AC
  Filled 2020-08-31: qty 10

## 2020-08-31 MED ORDER — KETOROLAC TROMETHAMINE 30 MG/ML IJ SOLN
INTRAMUSCULAR | Status: AC
  Filled 2020-08-31: qty 1

## 2020-08-31 MED ORDER — CITALOPRAM HYDROBROMIDE 20 MG PO TABS: 20.0000 mg | ORAL_TABLET | Freq: Every day | ORAL | Status: DC

## 2020-08-31 MED ORDER — BACITRACIN ZINC 500 UNIT/GM EX OINT
TOPICAL_OINTMENT | CUTANEOUS | Status: DC | PRN
  Administered 2020-08-31: 1 via TOPICAL

## 2020-08-31 SURGICAL SUPPLY — 58 items
APL SKNCLS STERI-STRIP NONHPOA (GAUZE/BANDAGES/DRESSINGS) ×1
BAND INSRT 18 STRL LF DISP RB (MISCELLANEOUS)
BAND RUBBER #18 3X1/16 STRL (MISCELLANEOUS) IMPLANT
BENZOIN TINCTURE PRP APPL 2/3 (GAUZE/BANDAGES/DRESSINGS) ×3 IMPLANT
BIT DRILL NEURO 2X3.1 SFT TUCH (MISCELLANEOUS) ×1 IMPLANT
BLADE SURG 15 STRL LF DISP TIS (BLADE) ×1 IMPLANT
BLADE SURG 15 STRL SS (BLADE) ×2
BLADE ULTRA TIP 2M (BLADE) ×2 IMPLANT
BUR BARREL STRAIGHT FLUTE 4.0 (BURR) ×2 IMPLANT
BUR MATCHSTICK NEURO 3.0 LAGG (BURR) ×2 IMPLANT
CANISTER SUCT 3000ML PPV (MISCELLANEOUS) ×2 IMPLANT
CARTRIDGE OIL MAESTRO DRILL (MISCELLANEOUS) ×1 IMPLANT
COVER MAYO STAND STRL (DRAPES) ×2 IMPLANT
COVER WAND RF STERILE (DRAPES) ×2 IMPLANT
DECANTER SPIKE VIAL GLASS SM (MISCELLANEOUS) ×2 IMPLANT
DEVICE FUSION VIST S 14X14X6MM (Trauma) IMPLANT
DIFFUSER DRILL AIR PNEUMATIC (MISCELLANEOUS) ×2 IMPLANT
DRAPE LAPAROTOMY 100X72 PEDS (DRAPES) ×2 IMPLANT
DRAPE MICROSCOPE LEICA (MISCELLANEOUS) IMPLANT
DRAPE SURG 17X23 STRL (DRAPES) ×4 IMPLANT
DRILL NEURO 2X3.1 SOFT TOUCH (MISCELLANEOUS) ×2
DRSG OPSITE POSTOP 3X4 (GAUZE/BANDAGES/DRESSINGS) ×2 IMPLANT
DRSG OPSITE POSTOP 4X6 (GAUZE/BANDAGES/DRESSINGS) ×1 IMPLANT
ELECT REM PT RETURN 9FT ADLT (ELECTROSURGICAL) ×2
ELECTRODE REM PT RTRN 9FT ADLT (ELECTROSURGICAL) ×1 IMPLANT
GAUZE 4X4 16PLY RFD (DISPOSABLE) IMPLANT
GLOVE BIO SURGEON STRL SZ8 (GLOVE) ×2 IMPLANT
GLOVE BIO SURGEON STRL SZ8.5 (GLOVE) ×2 IMPLANT
GLOVE EXAM NITRILE XL STR (GLOVE) IMPLANT
GOWN STRL REUS W/ TWL LRG LVL3 (GOWN DISPOSABLE) IMPLANT
GOWN STRL REUS W/ TWL XL LVL3 (GOWN DISPOSABLE) IMPLANT
GOWN STRL REUS W/TWL LRG LVL3 (GOWN DISPOSABLE)
GOWN STRL REUS W/TWL XL LVL3 (GOWN DISPOSABLE)
HEMOSTAT POWDER KIT SURGIFOAM (HEMOSTASIS) ×3 IMPLANT
KIT BASIN OR (CUSTOM PROCEDURE TRAY) ×2 IMPLANT
KIT TURNOVER KIT B (KITS) ×2 IMPLANT
MARKER SKIN DUAL TIP RULER LAB (MISCELLANEOUS) ×2 IMPLANT
NDL SPNL 18GX3.5 QUINCKE PK (NEEDLE) ×1 IMPLANT
NEEDLE HYPO 22GX1.5 SAFETY (NEEDLE) ×2 IMPLANT
NEEDLE SPNL 18GX3.5 QUINCKE PK (NEEDLE) ×2 IMPLANT
NS IRRIG 1000ML POUR BTL (IV SOLUTION) ×2 IMPLANT
OIL CARTRIDGE MAESTRO DRILL (MISCELLANEOUS) ×2
PACK LAMINECTOMY NEURO (CUSTOM PROCEDURE TRAY) ×2 IMPLANT
PEEK VISTA 14X14X7MM (Peek) ×1 IMPLANT
PIN DISTRACTION 14MM (PIN) ×4 IMPLANT
PLATE ANT CERV XTEND 2 LV 28 (Plate) ×1 IMPLANT
PUTTY DBM 5CC CALC GRAN ×1 IMPLANT
SCREW XTD VAR 4.2 SELF TAP (Screw) ×6 IMPLANT
SPONGE INTESTINAL PEANUT (DISPOSABLE) ×4 IMPLANT
SPONGE SURGIFOAM ABS GEL SZ50 (HEMOSTASIS) IMPLANT
STRIP CLOSURE SKIN 1/2X4 (GAUZE/BANDAGES/DRESSINGS) ×2 IMPLANT
SUT VIC AB 0 CT1 27 (SUTURE) ×2
SUT VIC AB 0 CT1 27XBRD ANTBC (SUTURE) ×1 IMPLANT
SUT VIC AB 3-0 SH 8-18 (SUTURE) ×2 IMPLANT
TOWEL GREEN STERILE (TOWEL DISPOSABLE) ×2 IMPLANT
TOWEL GREEN STERILE FF (TOWEL DISPOSABLE) ×2 IMPLANT
VISTA S O 14X14X6MM (Trauma) ×2 IMPLANT
WATER STERILE IRR 1000ML POUR (IV SOLUTION) ×2 IMPLANT

## 2020-08-31 NOTE — Transfer of Care (Signed)
Immediate Anesthesia Transfer of Care Note  Patient: Glen [name]  Procedure(s) Performed: ANTERIOR CERVICAL DECOMPRESSION/DISCECTOMY FUSION, INTERBODY PROSTHESIS, PLATE/SCREWS CERVICAL FIVE-SIX, CERVICAL SIX-SEVEN (N/A )  Patient Location: PACU  Anesthesia Type:General  Level of Consciousness: drowsy  Airway & Oxygen Therapy: Patient Spontanous Breathing  Post-op Assessment: Report given to RN  Post vital signs: Reviewed and stable  Last Vitals:  Vitals Value Taken Time  BP 115/72 08/31/20 1438  Temp    Pulse 81 08/31/20 1441  Resp 17 08/31/20 1441  SpO2 96 % 08/31/20 1441  Vitals shown include unvalidated device data.  Last Pain:  Vitals:   08/31/20 0818  TempSrc: Oral         Complications: No complications documented.

## 2020-08-31 NOTE — Progress Notes (Signed)
Orthopedic Tech Progress Note Patient Details:  Glen [name] Jul 30, 1965 [id number] Ordered brace Patient ID: Glen [name], male   DOB: 06/29/1965, 55 y.o.   MRN: [id number]   [name] 08/31/2020, 3:44 PM

## 2020-08-31 NOTE — Progress Notes (Signed)
Pharmacy Antibiotic Note  Glen [name] is a 55 y.o. male admitted on 08/31/2020 with cervical radiculopathy.  Pt is S/P cervical discectomy/decompression this afternoon. Pharmacy has been consulted for vancomycin dosing for post-op surgical prophylaxis.  Pt rec'd vancomycin 1 gm IV X 1 pre op. Per surgeon note, pt has no drains in place post op.  WBC 5.1; CrCl 114.2 ml/min  Plan: Vancomycin 1 gm IV X 1 at 1830 PM this evening  Height: 6\' 2"  (188 cm) Weight: 93 kg (205 lb) IBW/kg (Calculated) : 82.2  Temp (24hrs), Avg:98.5 F (36.9 C), Min:98.3 F (36.8 C), Max:98.8 F (37.1 C)  Recent Labs  Lab 08/31/20 0858  WBC 5.1  CREATININE 0.85    Estimated Creatinine Clearance: 114.2 mL/min (by C-G formula based on SCr of 0.85 mg/dL).    Allergies  Allergen Reactions  . Codeine Other (See Comments)    "Makes me really sick, I got hallucinations when the dose was really high" 08/13/20 Pt also states he is "very" sensitive to strong pain medications  . Penicillins Other (See Comments)    "I don't remember, but mom said don't take" 08/13/20    Microbiology results: None thus far this admission  Thank you for allowing pharmacy to be a part of this patient's care.  Gillermina Hu, PharmD, BCPS, [facility] Clinical Pharmacist 08/31/2020 5:23 PM

## 2020-08-31 NOTE — Op Note (Signed)
Brief history: The patient is a 55 year old white male who has complained of neck and left great and right arm pain consistent with a cervical radiculopathy.  He has failed medical management and was worked up with a cervical MRI.  This demonstrated spondylosis and foraminal stenosis most prominent at C5-6 and C6-7.  I discussed the various treatment options with him.  He has decided proceed with surgery after weighing the risk, benefits and alternatives.  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 Earle Gell  Asst.: Arnetha Massy, NP  Anesthesia: Gen. endotracheal  Estimated blood loss: 100 cc  Drains: None  Complications: None  Description of procedure: The patient was brought to the operating room by the anesthesia team. General endotracheal anesthesia was induced. A roll was placed under the patient's shoulders to keep the neck in the neutral position. The patient's anterior cervical region was then prepared with Betadine scrub and Betadine solution. Sterile drapes were applied.  The area to be incised was then injected with Marcaine with epinephrine solution. I then used a scalpel to make a transverse incision in the patient's left anterior neck. I used the Metzenbaum scissors to divide the platysmal muscle and then to dissect medial to the sternocleidomastoid muscle, jugular vein, and carotid artery. I carefully dissected down towards the anterior cervical spine identifying the esophagus and retracting it medially. Then using Kitner swabs to clear soft  tissue from the anterior cervical spine. We then inserted a bent spinal needle into the upper exposed intervertebral disc space. We then obtained intraoperative radiographs confirm our location.  I then used electrocautery to detach the medial border of the longus colli muscle bilaterally from the C5-6 and C6-7 intervertebral disc spaces. I then inserted the Caspar self-retaining retractor underneath the longus colli muscle bilaterally to provide exposure.  We then incised the intervertebral disc at C5-6 . We then performed a partial intervertebral discectomy with a pituitary forceps and the Karlin curettes. I then inserted distraction screws into the vertebral bodies at C5 and C6. We then distracted the interspace. We then used the high-speed drill to decorticate the vertebral endplates at D7-8, to drill away the remainder of the intervertebral disc, to drill away some posterior spondylosis, and to thin out the posterior longitudinal ligament. I then incised ligament with the arachnoid knife. We then removed the ligament with a Kerrison punches undercutting the vertebral endplates and decompressing the thecal sac. We then performed foraminotomies about the bilateral C6 nerve roots. This completed the decompression at this level.  We then repeat his procedure in analogous fashion at C6-7 decompressing the thecal sac and the bilateral C7 nerve roots.  We now turned our to attention to the interbody fusion. We used the trial spacers to determine the appropriate size for the interbody prosthesis. We then pre-filled prosthesis with a combination of local morcellized autograft bone that we obtained during decompression as well as Zimmer DBM. We then inserted the prosthesis into the distracted interspace at C5-6 and C6-7. We then removed the distraction screws. There was a good snug fit of the prosthesis in the interspace.  Having completed the fusion we now turned attention to the anterior spinal  instrumentation.  We used the high-speed drill to drill away some anterior spondylosis at the disc spaces so that the plate lay down flat. We selected the appropriate length titanium anterior cervical plate. We laid it along the anterior aspect of the vertebral bodies from C5-C7. We then drilled 14 mm holes at C5, C6 and C7. We then secured the plate to the vertebral bodies by placing two 14 mm self-tapping screws at C5, C6 and C7. We then obtained intraoperative radiograph. The demonstrating good position of the instrumentation. We therefore secured the screws the plate the locking each cam. This completed the instrumentation.  We then obtained hemostasis using bipolar electrocautery. We irrigated the wound out with bacitracin solution. We then removed the retractor. We inspected the esophagus for any damage. There was none apparent. We then reapproximated patient's platysmal muscle with interrupted 3-0 Vicryl suture. We then reapproximated the subcutaneous tissue with interrupted 3-0 Vicryl suture. The skin was reapproximated with Steri-Strips and benzoin. The wound was then covered with bacitracin ointment. A sterile dressing was applied. The drapes were removed. Patient was subsequently extubated by the anesthesia team and transported to the post anesthesia care unit in stable condition. All sponge instrument and needle counts were reportedly correct at the end of this case.

## 2020-08-31 NOTE — H&P (Signed)
Subjective: The patient is a 55 year old white male who has complained of neck and left arm pain consistent with a cervical radiculopathy.  He has failed medical management and was worked up with a cervical MRI which demonstrated spondylosis and stenosis most prominent at C5-6 and C6-7.  I discussed the various treatment options with him.  He has decided to proceed with surgery after weighing the risk, benefits and alternatives.  Past Medical History:  Diagnosis Date  . Allergic rhinitis   . Anxiety   . Arthritis   . Asthmatic bronchitis   . Dry eyes   . GERD (gastroesophageal reflux disease)   . Headache    "prone to migraines"  . Hepatitis B infection 2008   recovered  . Hypothyroidism 2010   Dr. Wende Bushy, been tested since and this is an inacurrate diagnosis  . Internal hemorrhoids   . PONV (postoperative nausea and vomiting)   . Urethritis 2010    Past Surgical History:  Procedure Laterality Date  . FACIAL COSMETIC SURGERY    . LIPOSUCTION    . TEAR DUCT PROBING  2009    Allergies  Allergen Reactions  . Codeine Other (See Comments)    "Makes me really sick, I got hallucinations when the dose was really high" 08/13/20 Pt also states he is "very" sensitive to strong pain medications  . Penicillins Other (See Comments)    "I don't remember, but mom said don't take" 08/13/20    Social History   Tobacco Use  . Smoking status: Former Smoker    Packs/day: 1.50    Types: Cigarettes    Quit date: 06/19/1999    Years since quitting: 21.2  . Smokeless tobacco: Never Used  Substance Use Topics  . Alcohol use: Not Currently    Family History  Problem Relation Age of Onset  . Stroke Mother   . Lupus Mother   . Arthritis Mother   . Depression Other   . Hypertension Other   . Stroke Other   . Heart disease Paternal Uncle 70       atrial fib  . Heart disease Paternal Grandfather 25       MI  . Arthritis Maternal Grandmother    Prior to Admission medications    Medication Sig Start Date End Date Taking? Authorizing Provider  acetaminophen (TYLENOL) 650 MG CR tablet Take 650 mg by mouth every 8 (eight) hours as needed for pain.   Yes [provider]  ALPRAZolam (XANAX) 0.25 MG tablet Take 1 tablet (0.25 mg total) by mouth 2 (two) times daily as needed for anxiety (Panic attacks). 08/19/20  Yes Plotnikov, Evie Lacks, MD  Cholecalciferol (VITAMIN D3) 2000 units capsule Take 1 capsule (2,000 Units total) by mouth daily. 07/11/15  Yes Plotnikov, Evie Lacks, MD  citalopram (CELEXA) 20 MG tablet Take 1 tablet (20 mg total) by mouth daily. 08/22/20  Yes Plotnikov, Evie Lacks, MD  Ginger, Zingiber officinalis, (GINGER PO) Take 1 tablet by mouth daily.   Yes [provider]  omeprazole (PRILOSEC) 40 MG capsule Take 1 capsule (40 mg total) by mouth daily. 07/11/15  Yes Plotnikov, Evie Lacks, MD  traMADol (ULTRAM) 50 MG tablet Take 50 mg by mouth 2 (two) times daily as needed for severe pain.   Yes [provider]  Turmeric (QC TUMERIC COMPLEX PO) Take 1 tablet by mouth daily.   Yes [provider]  valACYclovir (VALTREX) 500 MG tablet TAKE 1 TABLET BY MOUTH TWICE DAILY AS NEEDED FOR FEVER  BLISTERS FOR 5 DAYS Patient taking differently: Take 500 mg by mouth daily as needed (fever blisters). 02/17/20  Yes Plotnikov, Evie Lacks, MD  valsartan (DIOVAN) 80 MG tablet Take 1 tablet (80 mg total) by mouth daily. 08/15/20  Yes Plotnikov, Evie Lacks, MD  ZOMIG 5 MG nasal solution USE ONE DOSE IN NOSTRIL AS NEEDED FOR MIGRAINE Patient taking differently: Place 1 spray into the nose as needed for migraine. 01/25/16   Plotnikov, Evie Lacks, MD     Review of Systems  Positive ROS: As above  All other systems have been reviewed and were otherwise negative with the exception of those mentioned in the HPI and as above.  Objective: Vital signs in last 24 hours: Temp:  [98.3 F (36.8 C)] 98.3 F (36.8 C) (03/16 0818) Pulse Rate:  [77] 77 (03/16  0818) Resp:  [17] 17 (03/16 0818) BP: (129)/(87) 129/87 (03/16 0818) SpO2:  [100 %] 100 % (03/16 0818) Weight:  [93 kg] 93 kg (03/16 0818) Estimated body mass index is 26.32 kg/m as calculated from the following:   Height as of this encounter: 6\' 2"  (1.88 m).   Weight as of this encounter: 93 kg.   General Appearance: Alert Head: Normocephalic, without obvious abnormality, atraumatic Eyes: PERRL, conjunctiva/corneas clear, EOM's intact,    Ears: Normal  Throat: Normal  Neck: Decreased range of motion, positive Spurling test Back: unremarkable Lungs: Clear to auscultation bilaterally, respirations unlabored Heart: Regular rate and rhythm, no murmur, rub or gallop Abdomen: Soft, non-tender Extremities: Extremities normal, atraumatic, no cyanosis or edema Skin: unremarkable  NEUROLOGIC:   Mental status: alert and oriented,Motor Exam - grossly normal Sensory Exam - grossly normal Reflexes:  Coordination - grossly normal Gait - grossly normal Balance - grossly normal Cranial Nerves: I: smell Not tested  II: visual acuity  OS: Normal  OD: Normal   II: visual fields Full to confrontation  II: pupils Equal, round, reactive to light  III,VII: ptosis None  III,IV,VI: extraocular muscles  Full ROM  V: mastication Normal  V: facial light touch sensation  Normal  V,VII: corneal reflex  Present  VII: facial muscle function - upper  Normal  VII: facial muscle function - lower Normal  VIII: hearing Not tested  IX: soft palate elevation  Normal  IX,X: gag reflex Present  XI: trapezius strength  5/5  XI: sternocleidomastoid strength 5/5  XI: neck flexion strength  5/5  XII: tongue strength  Normal    Data Review Lab Results  Component Value Date   WBC 5.1 08/31/2020   HGB 15.1 08/31/2020   HCT 43.9 08/31/2020   MCV 92.0 08/31/2020   PLT 247 08/31/2020   Lab Results  Component Value Date   NA 137 08/31/2020   K 4.1 08/31/2020   CL 105 08/31/2020   CO2 26 08/31/2020    BUN 10 08/31/2020   CREATININE 0.85 08/31/2020   GLUCOSE 100 (H) 08/31/2020   No results found for: INR, PROTIME  Assessment/Plan: 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  I have reviewed his imaging studies with him and pointed out the abnormalities.  We have discussed the various treatment options including surgery.  I have described the surgical treatment option of a C5-6 and C6-7 anterior cervicectomy, fusion and plating.  I have shown him surgical models.  I have given him a surgical pamphlet.  We have discussed the risk, benefits, alternatives, expected postoperative course, and likelihood of achieving our  goals with surgery.  I have answered all his questions.  He has decided to proceed with surgery.   Ophelia Charter 08/31/2020 11:26 AM

## 2020-08-31 NOTE — Anesthesia Procedure Notes (Signed)
Procedure Name: Intubation Date/Time: 08/31/2020 11:47 AM Performed by: Barrington Ellison, CRNA Pre-anesthesia Checklist: Patient identified, Emergency Drugs available, Suction available and Patient being monitored Patient Re-evaluated:Patient Re-evaluated prior to induction Oxygen Delivery Method: Circle system utilized Preoxygenation: Pre-oxygenation with 100% oxygen Induction Type: IV induction Ventilation: Mask ventilation without difficulty Laryngoscope Size: Mac and 4 Grade View: Grade I Tube type: Oral Tube size: 7.5 mm Number of attempts: 1 Airway Equipment and Method: Stylet and Oral airway Placement Confirmation: ETT inserted through vocal cords under direct vision,  positive ETCO2 and breath sounds checked- equal and bilateral Secured at: 22 cm Tube secured with: Tape Dental Injury: Teeth and Oropharynx as per pre-operative assessment  Comments: Intubated by [facility]

## 2020-08-31 NOTE — Anesthesia Preprocedure Evaluation (Addendum)
Anesthesia Evaluation  Patient identified by MRN, date of birth, ID band Patient awake    Reviewed: Allergy & Precautions, NPO status , Patient's Chart, lab work & pertinent test results  History of Anesthesia Complications (+) PONV and history of anesthetic complications  Airway Mallampati: II  TM Distance: >3 FB Neck ROM: Full    Dental no notable dental hx. (+) Dental Advisory Given   Pulmonary neg pulmonary ROS, former smoker,    Pulmonary exam normal        Cardiovascular negative cardio ROS Normal cardiovascular exam     Neuro/Psych PSYCHIATRIC DISORDERS Anxiety Depression negative neurological ROS     GI/Hepatic Neg liver ROS, GERD  Medicated,  Endo/Other  negative endocrine ROS  Renal/GU negative Renal ROS     Musculoskeletal negative musculoskeletal ROS (+)   Abdominal   Peds  Hematology negative hematology ROS (+)   Anesthesia Other Findings   Reproductive/Obstetrics                            Anesthesia Physical Anesthesia Plan  ASA: II  Anesthesia Plan:    Post-op Pain Management:    Induction: Intravenous  PONV Risk Score and Plan: 4 or greater and Ondansetron, Dexamethasone, Midazolam, Scopolamine patch - Pre-op, TIVA and Propofol infusion  Airway Management Planned: Oral ETT  Additional Equipment:   Intra-op Plan:   Post-operative Plan: Extubation in OR  Informed Consent: I have reviewed the patients History and Physical, chart, labs and discussed the procedure including the risks, benefits and alternatives for the proposed anesthesia with the patient or authorized representative who has indicated his/her understanding and acceptance.     Dental advisory given  Plan Discussed with: Anesthesiologist and CRNA  Anesthesia Plan Comments:        Anesthesia Quick Evaluation

## 2020-08-31 NOTE — Anesthesia Postprocedure Evaluation (Signed)
Anesthesia Post Note  Patient: FULLER MAKIN  Procedure(s) Performed: ANTERIOR CERVICAL DECOMPRESSION/DISCECTOMY FUSION, INTERBODY PROSTHESIS, PLATE/SCREWS CERVICAL FIVE-SIX, CERVICAL SIX-SEVEN (N/A )     Patient location during evaluation: PACU Anesthesia Type: General Level of consciousness: sedated Pain management: pain level controlled Vital Signs Assessment: post-procedure vital signs reviewed and stable Respiratory status: spontaneous breathing and respiratory function stable Cardiovascular status: stable Postop Assessment: no apparent nausea or vomiting Anesthetic complications: no   No complications documented.  Last Vitals:  Vitals:   08/31/20 1555 08/31/20 1630  BP:  126/82  Pulse:  76  Resp:  20  Temp:    SpO2: 100% 98%    Last Pain:  Vitals:   08/31/20 1540  TempSrc:   PainSc: 7                  Ollie Delano DANIEL

## 2020-09-01 ENCOUNTER — Encounter (HOSPITAL_COMMUNITY): Payer: Self-pay | Admitting: Neurosurgery

## 2020-09-01 DIAGNOSIS — M50122 Cervical disc disorder at C5-C6 level with radiculopathy: Secondary | ICD-10-CM | POA: Diagnosis not present

## 2020-09-01 MED ORDER — DOCUSATE SODIUM 100 MG PO CAPS: 100.0000 mg | ORAL_CAPSULE | Freq: Two times a day (BID) | ORAL | 0 refills | Status: DC

## 2020-09-01 MED ORDER — OXYCODONE-ACETAMINOPHEN 5-325 MG PO TABS: 1.0000 | ORAL_TABLET | ORAL | Status: DC | PRN

## 2020-09-01 MED ORDER — OXYCODONE-ACETAMINOPHEN 5-325 MG PO TABS: 1.0000 | ORAL_TABLET | ORAL | 0 refills | Status: DC | PRN

## 2020-09-01 MED FILL — Thrombin For Soln Kit 5000 Unit: CUTANEOUS | Qty: 1 | Status: AC

## 2020-09-01 MED FILL — Thrombin (Recombinant) For Soln 5000 Unit: CUTANEOUS | Qty: 5000 | Status: AC

## 2020-09-01 NOTE — Discharge Summary (Signed)
Physician Discharge Summary  Patient ID: Glen [name] MRN: [id number] DOB/AGE: 24-Dec-1965 55 y.o.  Admit date: 08/31/2020 Discharge date: 09/01/2020  Admission Diagnoses: Cervical spondylosis, cervical radiculopathy, cervicalgia, cervical stenosis  Discharge Diagnoses: The same Active Problems:   Cervical spondylosis with radiculopathy   Discharged Condition: good  Hospital Course: I performed a C5-6 and C6-7 anterior cervical discectomy, fusion and plating on the patient on 08/31/2020.  The surgery went well.  The patient's postoperative course was unremarkable.  On postoperative day #1 he requested discharge to home.  He was given oral and written discharge instructions.  All his questions were answered.  Consults: OT, care management Significant Diagnostic Studies: None Treatments: C5-6 and C6-7 anterior cervical discectomy, fusion and plating. Discharge Exam: Blood pressure 119/73, pulse 76, temperature (!) 97.5 F (36.4 C), temperature source Oral, resp. rate 18, height 6\' 2"  (1.88 m), weight 93 kg, SpO2 99 %. The patient is alert and pleasant.  He looks well.  His dressing is clean and dry.  There is no hematoma or shift.  His strength is normal.  Disposition: Home  Discharge Instructions    Call MD for:  difficulty breathing, headache or visual disturbances   Complete by: As directed    Call MD for:  extreme fatigue   Complete by: As directed    Call MD for:  hives   Complete by: As directed    Call MD for:  persistant dizziness or light-headedness   Complete by: As directed    Call MD for:  persistant nausea and vomiting   Complete by: As directed    Call MD for:  redness, tenderness, or signs of infection (pain, swelling, redness, odor or green/yellow discharge around incision site)   Complete by: As directed    Call MD for:  severe uncontrolled pain   Complete by: As directed    Call MD for:  temperature >100.4   Complete by: As directed    Diet - low sodium  heart healthy   Complete by: As directed    Discharge instructions   Complete by: As directed    Call [phone] for a followup appointment. Take a stool softener while you are using pain medications.   Driving Restrictions   Complete by: As directed    Do not drive for 2 weeks.   Increase activity slowly   Complete by: As directed    Lifting restrictions   Complete by: As directed    Do not lift more than 5 pounds. No excessive bending or twisting.   May shower / Bathe   Complete by: As directed    Remove the dressing for 3 days after surgery.  You may shower, but leave the incision alone.   Remove dressing in 48 hours   Complete by: As directed      Allergies as of 09/01/2020      Reactions   Codeine Other (See Comments)   "Makes me really sick, I got hallucinations when the dose was really high" 08/13/20 Pt also states he is "very" sensitive to strong pain medications   Penicillins Other (See Comments)   "I don't remember, but mom said don't take" 08/13/20      Medication List    STOP taking these medications   acetaminophen 650 MG CR tablet Commonly known as: TYLENOL     TAKE these medications   ALPRAZolam 0.25 MG tablet Commonly known as: XANAX Take 1 tablet (0.25 mg total) by mouth 2 (two) times daily as  needed for anxiety (Panic attacks).   citalopram 20 MG tablet Commonly known as: CeleXA Take 1 tablet (20 mg total) by mouth daily.   docusate sodium 100 MG capsule Commonly known as: COLACE Take 1 capsule (100 mg total) by mouth 2 (two) times daily.   GINGER PO Take 1 tablet by mouth daily.   omeprazole 40 MG capsule Commonly known as: PRILOSEC Take 1 capsule (40 mg total) by mouth daily.   oxyCODONE-acetaminophen 5-325 MG tablet Commonly known as: PERCOCET/ROXICET Take 1 tablet by mouth every 4 (four) hours as needed for moderate pain.   QC TUMERIC COMPLEX PO Take 1 tablet by mouth daily.   traMADol 50 MG tablet Commonly known as: ULTRAM Take  50 mg by mouth 2 (two) times daily as needed for severe pain.   valACYclovir 500 MG tablet Commonly known as: VALTREX TAKE 1 TABLET BY MOUTH TWICE DAILY AS NEEDED FOR FEVER BLISTERS FOR 5 DAYS What changed:   how much to take  how to take this  when to take this  reasons to take this  additional instructions   valsartan 80 MG tablet Commonly known as: Diovan Take 1 tablet (80 mg total) by mouth daily.   Vitamin D3 50 MCG (2000 UT) capsule Take 1 capsule (2,000 Units total) by mouth daily.   Zomig 5 MG nasal solution Generic drug: zolmitriptan USE ONE DOSE IN NOSTRIL AS NEEDED FOR MIGRAINE What changed: See the new instructions.        Signed: Ophelia Charter 09/01/2020, 7:54 AM

## 2020-09-01 NOTE — Progress Notes (Signed)
Patient is discharged from room 3C03 at this time. Alert and in stable condition. IV site d/c'd and instructions read to patient with understanding verbalized and all questions answered. Left unit via wheelchair with all belongings at side.

## 2020-09-01 NOTE — Evaluation (Signed)
Occupational Therapy Evaluation Patient Details Name: Glen [name] MRN: [id number] DOB: [birth year] Today's Date: 09/01/2020    History of Present Illness 55 year old white male who has complained of neck and left arm pain consistent with a cervical radiculopathy.  Underwent C5-6 and C6-7 anterior cervical discectomy, fusion.   Clinical Impression   Patient was admitted for the diagnosis and procedure above.  PTA, independent in all self care and mobility.  Continues to work full time.  Deficits impacting function are listed below.  Currently, patient is functioning ADL and mobility at or near his baseline.  No further OT needs in the acute or post acute setting.      Follow Up Recommendations  No OT follow up    Equipment Recommendations  None recommended by OT    Recommendations for Other Services       Precautions / Restrictions Precautions Precautions: Cervical Precaution Booklet Issued: No Required Braces or Orthoses: Cervical Brace Cervical Brace: Hard collar;For comfort Restrictions Weight Bearing Restrictions: No      Mobility Bed Mobility Overal bed mobility: Independent                  Transfers Overall transfer level: Independent                    Balance Overall balance assessment: No apparent balance deficits (not formally assessed)                                         ADL either performed or assessed with clinical judgement   ADL Overall ADL's : At baseline;Modified independent                                       General ADL Comments: Perform ADL from modified sit/stand position     Vision Baseline Vision/History: No visual deficits Patient Visual Report: No change from baseline       Perception     Praxis      Pertinent Vitals/Pain Pain Assessment: 0-10 Pain Score: 2  Pain Location: surgical site Pain Descriptors / Indicators: Tender Pain Intervention(s): Monitored during session      Hand Dominance Right   Extremity/Trunk Assessment Upper Extremity Assessment Upper Extremity Assessment: Overall WFL for tasks assessed   Lower Extremity Assessment Lower Extremity Assessment: Overall WFL for tasks assessed   Cervical / Trunk Assessment Cervical / Trunk Assessment: Normal   Communication Communication Communication: No difficulties   Cognition Arousal/Alertness: Awake/alert Behavior During Therapy: WFL for tasks assessed/performed Overall Cognitive Status: Within Functional Limits for tasks assessed                                                      Home Living Family/patient expects to be discharged to:: Private residence Living Arrangements: Spouse/significant other Available Help at Discharge: Family;Available 24 hours/day Type of Home: House Home Access: Level entry     Home Layout: One level     Bathroom Shower/Tub: Occupational psychologist: Standard     Home Equipment: None          Prior Functioning/Environment Level of Independence: Independent  Comments: working full time        OT Problem List: Pain      OT Treatment/Interventions:      OT Goals(Current goals can be found in the care plan section) Acute Rehab OT Goals Patient Stated Goal: return to work OT Goal Formulation: With patient Time For Goal Achievement: 09/01/20 Potential to Achieve Goals: Good  OT Frequency:     Barriers to D/C:   None noted         Co-evaluation              AM-PAC OT "6 Clicks" Daily Activity     Outcome Measure Help from another person eating meals?: None Help from another person taking care of personal grooming?: None Help from another person toileting, which includes using toliet, bedpan, or urinal?: None Help from another person bathing (including washing, rinsing, drying)?: None Help from another person to put on and taking off regular upper body clothing?: None Help from another  person to put on and taking off regular lower body clothing?: None 6 Click Score: 24   End of Session Equipment Utilized During Treatment: Cervical collar Nurse Communication: Mobility status  Activity Tolerance: Patient tolerated treatment well Patient left: in bed;with call bell/phone within reach  OT Visit Diagnosis: Pain Pain - part of body:  (cervical)                Time: 0811-0828 OT Time Calculation (min): 17 min Charges:  OT General Charges $OT Visit: 1 Visit OT Evaluation $OT Eval Moderate Complexity: 1 Mod  09/01/2020  Rich, OTR/L  Acute Rehabilitation Services  Office:  [phone]   Metta Clines 09/01/2020, 8:37 AM

## 2020-09-06 ENCOUNTER — Encounter: Payer: Self-pay | Admitting: Internal Medicine

## 2020-09-22 ENCOUNTER — Encounter: Payer: Self-pay | Admitting: Internal Medicine

## 2020-09-22 ENCOUNTER — Other Ambulatory Visit: Payer: Self-pay | Admitting: Internal Medicine

## 2020-09-22 DIAGNOSIS — K2270 Barrett's esophagus without dysplasia: Secondary | ICD-10-CM

## 2020-09-22 DIAGNOSIS — K5120 Ulcerative (chronic) proctitis without complications: Secondary | ICD-10-CM | POA: Insufficient documentation

## 2020-09-22 MED ORDER — OMEPRAZOLE 40 MG PO CPDR: 40.0000 mg | DELAYED_RELEASE_CAPSULE | Freq: Every day | ORAL | 1 refills | Status: DC

## 2020-10-18 ENCOUNTER — Other Ambulatory Visit: Payer: Self-pay

## 2020-10-18 ENCOUNTER — Ambulatory Visit (INDEPENDENT_AMBULATORY_CARE_PROVIDER_SITE_OTHER): Payer: 59 | Admitting: Internal Medicine

## 2020-10-18 ENCOUNTER — Encounter: Payer: Self-pay | Admitting: Internal Medicine

## 2020-10-18 VITALS — BP 138/72 | HR 71 | Temp 98.4°F | Ht 74.0 in | Wt 215.2 lb

## 2020-10-18 DIAGNOSIS — F419 Anxiety disorder, unspecified: Secondary | ICD-10-CM

## 2020-10-18 DIAGNOSIS — R635 Abnormal weight gain: Secondary | ICD-10-CM | POA: Insufficient documentation

## 2020-10-18 DIAGNOSIS — E785 Hyperlipidemia, unspecified: Secondary | ICD-10-CM | POA: Diagnosis not present

## 2020-10-18 MED ORDER — OMEPRAZOLE 40 MG PO CPDR: 40.0000 mg | DELAYED_RELEASE_CAPSULE | Freq: Every day | ORAL | 3 refills | Status: DC

## 2020-10-18 MED ORDER — CITALOPRAM HYDROBROMIDE 20 MG PO TABS: 10.0000 mg | ORAL_TABLET | Freq: Every day | ORAL | 3 refills | Status: DC

## 2020-10-18 NOTE — Patient Instructions (Signed)

## 2020-10-18 NOTE — Assessment & Plan Note (Signed)
A cardiac CT scan for calcium scoring sch in 2022

## 2020-10-18 NOTE — Assessment & Plan Note (Signed)
Reduce Celexa to 1/2 a day

## 2020-10-18 NOTE — Progress Notes (Signed)
Subjective:  Patient ID: Glen [name], male    DOB: August 11, 1965  Age: 55 y.o. MRN: [id number]  CC: Follow-up (2 MONTH F/U)   HPI Glen [name] presents for neck pain, anxiety, HTN SBP 140s at home  Outpatient Medications Prior to Visit  Medication Sig Dispense Refill  . ALPRAZolam (XANAX) 0.25 MG tablet Take 1 tablet (0.25 mg total) by mouth 2 (two) times daily as needed for anxiety (Panic attacks). 60 tablet 3  . Cholecalciferol (VITAMIN D3) 2000 units capsule Take 1 capsule (2,000 Units total) by mouth daily. 100 capsule 3  . citalopram (CELEXA) 20 MG tablet Take 1 tablet (20 mg total) by mouth daily. 90 tablet 3  . docusate sodium (COLACE) 100 MG capsule Take 1 capsule (100 mg total) by mouth 2 (two) times daily. 60 capsule 0  . Ginger, Zingiber officinalis, (GINGER PO) Take 1 tablet by mouth daily.    Marland Kitchen omeprazole (PRILOSEC) 40 MG capsule Take 1 capsule (40 mg total) by mouth daily. 90 capsule 1  . oxyCODONE-acetaminophen (PERCOCET/ROXICET) 5-325 MG tablet Take 1 tablet by mouth every 4 (four) hours as needed for moderate pain. 30 tablet 0  . Turmeric (QC TUMERIC COMPLEX PO) Take 1 tablet by mouth daily.    . valACYclovir (VALTREX) 500 MG tablet TAKE 1 TABLET BY MOUTH TWICE DAILY AS NEEDED FOR FEVER BLISTERS FOR 5 DAYS (Patient taking differently: Take 500 mg by mouth daily as needed (fever blisters).) 30 tablet 0  . valsartan (DIOVAN) 80 MG tablet Take 1 tablet (80 mg total) by mouth daily. 30 tablet 11  . ZOMIG 5 MG nasal solution USE ONE DOSE IN NOSTRIL AS NEEDED FOR MIGRAINE (Patient taking differently: Place 1 spray into the nose as needed for migraine.) 6 Units 3  . traMADol (ULTRAM) 50 MG tablet Take 50 mg by mouth 2 (two) times daily as needed for severe pain. (Patient not taking: Reported on 10/18/2020)     [name] facility-administered medications prior to visit.    ROS: Review of Systems  Constitutional: Negative for appetite change, fatigue and unexpected weight change.   HENT: Negative for congestion, nosebleeds, sneezing, sore throat and trouble swallowing.   Eyes: Negative for itching and visual disturbance.  Respiratory: Negative for cough.   Cardiovascular: Negative for chest pain, palpitations and leg swelling.  Gastrointestinal: Negative for abdominal distention, blood in stool, diarrhea and nausea.  Genitourinary: Negative for frequency and hematuria.  Musculoskeletal: Positive for arthralgias. Negative for back pain, gait problem, joint swelling and neck pain.  Skin: Negative for rash.  Neurological: Negative for dizziness, tremors, speech difficulty and weakness.  Psychiatric/Behavioral: Negative for agitation, dysphoric mood, sleep disturbance and suicidal ideas. The patient is nervous/anxious.     Objective:  BP 138/72 (BP Location: Left Arm)   Pulse 71   Temp 98.4 F (36.9 C) (Oral)   Ht 6\' 2"  (1.88 m)   Wt 215 lb 3.2 oz (97.6 kg)   SpO2 97%   BMI 27.63 kg/m   BP Readings from Last 3 Encounters:  10/18/20 138/72  09/01/20 119/73  08/22/20 138/84    Wt Readings from Last 3 Encounters:  10/18/20 215 lb 3.2 oz (97.6 kg)  08/31/20 205 lb (93 kg)  08/22/20 207 lb 3.2 oz (94 kg)    Physical Exam Constitutional:      General: He is not in acute distress.    Appearance: He is well-developed.     Comments: NAD  Eyes:     Conjunctiva/sclera: Conjunctivae  normal.     Pupils: Pupils are equal, round, and reactive to light.  Neck:     Thyroid: [name] thyromegaly.     Vascular: [name] JVD.  Cardiovascular:     Rate and Rhythm: Normal rate and regular rhythm.     Heart sounds: Normal heart sounds. [name] murmur heard. [name] friction rub. [name] gallop.   Pulmonary:     Effort: Pulmonary effort is normal. [name] respiratory distress.     Breath sounds: Normal breath sounds. [name] wheezing or rales.  Chest:     Chest wall: [name] tenderness.  Abdominal:     General: Bowel sounds are normal. There is [name] distension.     Palpations: Abdomen is soft. There is [name]  mass.     Tenderness: There is [name] abdominal tenderness. There is [name] guarding or rebound.  Musculoskeletal:        General: [name] tenderness. Normal range of motion.     Cervical back: Normal range of motion.  Lymphadenopathy:     Cervical: [name] cervical adenopathy.  Skin:    General: Skin is warm and dry.     Findings: [name] rash.  Neurological:     Mental Status: He is alert and oriented to person, place, and time.     Cranial Nerves: [name] cranial nerve deficit.     Motor: [name] abnormal muscle tone.     Coordination: Coordination normal.     Gait: Gait normal.     Deep Tendon Reflexes: Reflexes are normal and symmetric.  Psychiatric:        Behavior: Behavior normal.        Thought Content: Thought content normal.        Judgment: Judgment normal.     Lab Results  Component Value Date   WBC 5.1 08/31/2020   HGB 15.1 08/31/2020   HCT 43.9 08/31/2020   PLT 247 08/31/2020   GLUCOSE 100 (H) 08/31/2020   CHOL 198 08/08/2020   TRIG 66.0 08/08/2020   HDL 53.40 08/08/2020   LDLDIRECT 150.6 06/29/2013   LDLCALC 132 (H) 08/08/2020   ALT 19 08/31/2020   AST 22 08/31/2020   NA 137 08/31/2020   K 4.1 08/31/2020   CL 105 08/31/2020   CREATININE 0.85 08/31/2020   BUN 10 08/31/2020   CO2 26 08/31/2020   TSH 1.11 08/08/2020   PSA 1.08 08/08/2020   HGBA1C 5.3 12/02/2009    [name] results found.  Assessment & Plan:    Walker Kehr, MD

## 2020-10-18 NOTE — Assessment & Plan Note (Signed)
Reduce Celexa to 1/2 a day due to wt gain

## 2020-10-19 ENCOUNTER — Ambulatory Visit (INDEPENDENT_AMBULATORY_CARE_PROVIDER_SITE_OTHER): Payer: 59 | Admitting: Podiatry

## 2020-10-19 ENCOUNTER — Ambulatory Visit (INDEPENDENT_AMBULATORY_CARE_PROVIDER_SITE_OTHER): Payer: 59

## 2020-10-19 DIAGNOSIS — M19072 Primary osteoarthritis, left ankle and foot: Secondary | ICD-10-CM

## 2020-10-19 DIAGNOSIS — M722 Plantar fascial fibromatosis: Secondary | ICD-10-CM

## 2020-10-19 DIAGNOSIS — M7752 Other enthesopathy of left foot: Secondary | ICD-10-CM

## 2020-10-21 ENCOUNTER — Encounter: Payer: Self-pay | Admitting: Podiatry

## 2020-10-21 NOTE — Progress Notes (Signed)
Subjective:  Patient ID: Glen [name], male    DOB: 1966-04-26,  MRN: [id number]  Chief Complaint  Patient presents with  . Foot Pain    Left foot pain    55 y.o. male presents with the above complaint.  Patient presents with a new complaint of left ankle joint pain.  Patient states that it is burning around the entire portion of the ankle especially after has been on his feet for long period of time.  He has pain across the entire top and the side of the ankle joint.  He states there is something there could be swelling however is pain with a long period of standing.  He states that it is painful to walk on.  He is doing well from his surgical site.  He denies any other acute complaints.  He would like to discuss treatment options for this.   Review of Systems: Negative except as noted in the HPI. Denies N/V/F/Ch.  Past Medical History:  Diagnosis Date  . Allergic rhinitis   . Anxiety   . Arthritis   . Asthmatic bronchitis   . Dry eyes   . GERD (gastroesophageal reflux disease)   . Headache    "prone to migraines"  . Hepatitis B infection 2008   recovered  . Hypothyroidism 2010   Dr. Wende Bushy, been tested since and this is an inacurrate diagnosis  . Internal hemorrhoids   . PONV (postoperative nausea and vomiting)   . Urethritis 2010    Current Outpatient Medications:  .  ALPRAZolam (XANAX) 0.25 MG tablet, Take 1 tablet (0.25 mg total) by mouth 2 (two) times daily as needed for anxiety (Panic attacks)., Disp: 60 tablet, Rfl: 3 .  Cholecalciferol (VITAMIN D3) 2000 units capsule, Take 1 capsule (2,000 Units total) by mouth daily., Disp: 100 capsule, Rfl: 3 .  citalopram (CELEXA) 20 MG tablet, Take 0.5 tablets (10 mg total) by mouth daily., Disp: 90 tablet, Rfl: 3 .  docusate sodium (COLACE) 100 MG capsule, Take 1 capsule (100 mg total) by mouth 2 (two) times daily., Disp: 60 capsule, Rfl: 0 .  Ginger, Zingiber officinalis, (GINGER PO), Take 1 tablet by mouth daily., Disp: ,  Rfl:  .  omeprazole (PRILOSEC) 40 MG capsule, Take 1 capsule (40 mg total) by mouth daily., Disp: 90 capsule, Rfl: 3 .  Turmeric (QC TUMERIC COMPLEX PO), Take 1 tablet by mouth daily., Disp: , Rfl:  .  valACYclovir (VALTREX) 500 MG tablet, TAKE 1 TABLET BY MOUTH TWICE DAILY AS NEEDED FOR FEVER BLISTERS FOR 5 DAYS (Patient taking differently: Take 500 mg by mouth daily as needed (fever blisters).), Disp: 30 tablet, Rfl: 0 .  valsartan (DIOVAN) 80 MG tablet, Take 1 tablet (80 mg total) by mouth daily., Disp: 30 tablet, Rfl: 11 .  ZOMIG 5 MG nasal solution, USE ONE DOSE IN NOSTRIL AS NEEDED FOR MIGRAINE (Patient taking differently: Place 1 spray into the nose as needed for migraine.), Disp: 6 Units, Rfl: 3  Social History   Tobacco Use  Smoking Status Former Smoker  . Packs/day: 1.50  . Types: Cigarettes  . Quit date: 06/19/1999  . Years since quitting: 21.3  Smokeless Tobacco Never Used    Allergies  Allergen Reactions  . Codeine Other (See Comments)    "Makes me really sick, I got hallucinations when the dose was really high" 08/13/20 Pt also states he is "very" sensitive to strong pain medications  . Penicillins Other (See Comments)    "I don't  remember, but mom said don't take" 08/13/20   Objective:  There were no vitals filed for this visit. There is no height or weight on file to calculate BMI. Constitutional Well developed. Well nourished.  Vascular Dorsalis pedis pulses palpable bilaterally. Posterior tibial pulses palpable bilaterally. Capillary refill normal to all digits.  No cyanosis or clubbing noted. Pedal hair growth normal.  Neurologic Normal speech. Oriented to person, place, and time. Epicritic sensation to light touch grossly present bilaterally.  Dermatologic Nails well groomed and normal in appearance. No open wounds. No skin lesions.  Orthopedic:  Pain on palpation to the left ankle joint.  Pain with range of motion of the ankle joint.  Deep intra-articular  ankle joint pain noted.  No pain with range of motion of the subtalar joint.   Radiographs: 3 views of skeletally mature adult left ankle: Osteoarthritic changes noted appears to be moderate in nature to the left ankle.  No osteophytes noted.  No anterior lipping noted.  No arthritis at the subtalar joint. Assessment:   1. Arthritis of ankle, left   2. Capsulitis of ankle, left    Plan:  Patient was evaluated and treated and all questions answered.  Left ankle arthritis with underlying capsulitis -I explained to the patient the etiology of arthritis and worse treatment options were extensively discussed.  Given the amount of pain that he is having I believe patient will benefit from a steroid injection help decrease acute inflammatory component associate with pain.  Patient agrees with the plan like to proceed with steroid injection -A steroid injection was performed at left ankle joint using 1% plain Lidocaine and 10 mg of Kenalog. This was well tolerated. -If there is no improvement we will discuss doing diagnostic arthroscopy with intervention.   No follow-ups on file.

## 2020-10-27 ENCOUNTER — Telehealth: Payer: Self-pay | Admitting: Internal Medicine

## 2020-10-27 DIAGNOSIS — K2270 Barrett's esophagus without dysplasia: Secondary | ICD-10-CM

## 2020-10-27 NOTE — Telephone Encounter (Signed)
Hi Dr. Carlean Purl,   We received a referral from PCP for patient to have another colonoscopy that is currently due for diagnosis Barrett's esophagitis and ulcerative proctitis. Patient has been seeing Dr. Earlean Shawl in past and due to the provider retiring he would like to transfer to [facility].    Thank you

## 2020-10-31 ENCOUNTER — Encounter: Payer: Self-pay | Admitting: Internal Medicine

## 2020-10-31 ENCOUNTER — Ambulatory Visit (INDEPENDENT_AMBULATORY_CARE_PROVIDER_SITE_OTHER): Payer: 59 | Admitting: Internal Medicine

## 2020-10-31 ENCOUNTER — Other Ambulatory Visit: Payer: Self-pay

## 2020-10-31 DIAGNOSIS — H66002 Acute suppurative otitis media without spontaneous rupture of ear drum, left ear: Secondary | ICD-10-CM

## 2020-10-31 MED ORDER — DOXYCYCLINE HYCLATE 100 MG PO TABS: 100.0000 mg | ORAL_TABLET | Freq: Two times a day (BID) | ORAL | 0 refills | Status: DC

## 2020-10-31 NOTE — Progress Notes (Signed)
Subjective:  Patient ID: Glen [name], male    DOB: 1966/01/31  Age: 55 y.o. MRN: [id number]  CC: Follow-up (Fluid in (L) ear)   HPI ULUS HAZEN presents for L ear discomfort, mild dizziness x 2 wks On Flonase  Outpatient Medications Prior to Visit  Medication Sig Dispense Refill  . ALPRAZolam (XANAX) 0.25 MG tablet Take 1 tablet (0.25 mg total) by mouth 2 (two) times daily as needed for anxiety (Panic attacks). 60 tablet 3  . Cholecalciferol (VITAMIN D3) 2000 units capsule Take 1 capsule (2,000 Units total) by mouth daily. 100 capsule 3  . citalopram (CELEXA) 20 MG tablet Take 0.5 tablets (10 mg total) by mouth daily. 90 tablet 3  . docusate sodium (COLACE) 100 MG capsule Take 1 capsule (100 mg total) by mouth 2 (two) times daily. 60 capsule 0  . Ginger, Zingiber officinalis, (GINGER PO) Take 1 tablet by mouth daily.    Marland Kitchen omeprazole (PRILOSEC) 40 MG capsule Take 1 capsule (40 mg total) by mouth daily. 90 capsule 3  . Turmeric (QC TUMERIC COMPLEX PO) Take 1 tablet by mouth daily.    . valACYclovir (VALTREX) 500 MG tablet TAKE 1 TABLET BY MOUTH TWICE DAILY AS NEEDED FOR FEVER BLISTERS FOR 5 DAYS (Patient taking differently: Take 500 mg by mouth daily as needed (fever blisters).) 30 tablet 0  . valsartan (DIOVAN) 80 MG tablet Take 1 tablet (80 mg total) by mouth daily. 30 tablet 11  . ZOMIG 5 MG nasal solution USE ONE DOSE IN NOSTRIL AS NEEDED FOR MIGRAINE (Patient taking differently: Place 1 spray into the nose as needed for migraine.) 6 Units 3   No facility-administered medications prior to visit.    ROS: Review of Systems  Constitutional: Negative for appetite change, fatigue and unexpected weight change.  HENT: Positive for ear pain, hearing loss and postnasal drip. Negative for congestion, nosebleeds, sneezing, sore throat and trouble swallowing.   Eyes: Negative for itching and visual disturbance.  Respiratory: Negative for cough.   Cardiovascular: Negative for chest pain,  palpitations and leg swelling.  Gastrointestinal: Negative for abdominal distention, blood in stool, diarrhea and nausea.  Genitourinary: Negative for frequency and hematuria.  Musculoskeletal: Negative for back pain, gait problem, joint swelling and neck pain.  Skin: Negative for rash.  Neurological: Positive for dizziness. Negative for tremors, speech difficulty and weakness.  Psychiatric/Behavioral: Negative for agitation, dysphoric mood and sleep disturbance. The patient is not nervous/anxious.     Objective:  BP 118/72 (BP Location: Left Arm)   Pulse 72   Temp 99.1 F (37.3 C) (Oral)   Ht 6\' 2"  (1.88 m)   Wt 212 lb 3.2 oz (96.3 kg)   SpO2 98%   BMI 27.24 kg/m   BP Readings from Last 3 Encounters:  10/31/20 118/72  10/18/20 138/72  09/01/20 119/73    Wt Readings from Last 3 Encounters:  10/31/20 212 lb 3.2 oz (96.3 kg)  10/18/20 215 lb 3.2 oz (97.6 kg)  08/31/20 205 lb (93 kg)    Physical Exam Constitutional:      General: He is not in acute distress.    Appearance: He is well-developed.     Comments: NAD  Eyes:     Conjunctiva/sclera: Conjunctivae normal.     Pupils: Pupils are equal, round, and reactive to light.  Neck:     Thyroid: No thyromegaly.     Vascular: No JVD.  Cardiovascular:     Rate and Rhythm: Normal rate and regular  rhythm.     Heart sounds: Normal heart sounds. No murmur heard. No friction rub. No gallop.   Pulmonary:     Effort: Pulmonary effort is normal. No respiratory distress.     Breath sounds: Normal breath sounds. No wheezing or rales.  Chest:     Chest wall: No tenderness.  Abdominal:     General: Bowel sounds are normal. There is no distension.     Palpations: Abdomen is soft. There is no mass.     Tenderness: There is no abdominal tenderness. There is no guarding or rebound.  Musculoskeletal:        General: No tenderness. Normal range of motion.     Cervical back: Normal range of motion.  Lymphadenopathy:     Cervical: No  cervical adenopathy.  Skin:    General: Skin is warm and dry.     Findings: No rash.  Neurological:     Mental Status: He is alert and oriented to person, place, and time.     Cranial Nerves: No cranial nerve deficit.     Motor: No abnormal muscle tone.     Coordination: Coordination normal.     Gait: Gait normal.     Deep Tendon Reflexes: Reflexes are normal and symmetric.  Psychiatric:        Behavior: Behavior normal.        Thought Content: Thought content normal.        Judgment: Judgment normal.    L TM w/fluid behind  Lab Results  Component Value Date   WBC 5.1 08/31/2020   HGB 15.1 08/31/2020   HCT 43.9 08/31/2020   PLT 247 08/31/2020   GLUCOSE 100 (H) 08/31/2020   CHOL 198 08/08/2020   TRIG 66.0 08/08/2020   HDL 53.40 08/08/2020   LDLDIRECT 150.6 06/29/2013   LDLCALC 132 (H) 08/08/2020   ALT 19 08/31/2020   AST 22 08/31/2020   NA 137 08/31/2020   K 4.1 08/31/2020   CL 105 08/31/2020   CREATININE 0.85 08/31/2020   BUN 10 08/31/2020   CO2 26 08/31/2020   TSH 1.11 08/08/2020   PSA 1.08 08/08/2020   HGBA1C 5.3 12/02/2009    No results found.  Assessment & Plan:   Walker Kehr, MD

## 2020-10-31 NOTE — Assessment & Plan Note (Addendum)
L ear Start  Doxy x 2 wks Cont w/Flonase

## 2020-11-02 ENCOUNTER — Encounter: Payer: Self-pay | Admitting: Gastroenterology

## 2020-11-02 ENCOUNTER — Encounter: Payer: Self-pay | Admitting: Internal Medicine

## 2020-11-02 NOTE — Telephone Encounter (Signed)
Glendell Docker, Sorry for misunderstanding. We have requested a consult/OV. Thanks, AP

## 2020-11-02 NOTE — Telephone Encounter (Signed)
No worries - that is what we will do.  Glen [name] - I can see him if he needs to be seen sooner than Dr. Candis Schatz is available

## 2020-11-02 NOTE — Telephone Encounter (Signed)
Chart reviewed - I disagree with Dr. Earlean Shawl recommendations re: timing of procedures based upon that  Please schedule a non-urgent appointment for this patient with me or could also setup with Dr. Candis Schatz (prefer that)   Can explain that we need to review in person and see how he is doing and explain f/u plans, etc

## 2020-11-07 NOTE — Telephone Encounter (Signed)
Patient is currently scheduled with Dr. Candis Schatz for [phone].

## 2020-11-16 ENCOUNTER — Ambulatory Visit: Payer: 59 | Admitting: Podiatry

## 2020-11-21 ENCOUNTER — Ambulatory Visit (INDEPENDENT_AMBULATORY_CARE_PROVIDER_SITE_OTHER)
Admission: RE | Admit: 2020-11-21 | Discharge: 2020-11-21 | Disposition: A | Discharge status: Home / Self Care | Payer: Self-pay | Source: Ambulatory Visit | Attending: Internal Medicine | Admitting: Internal Medicine

## 2020-11-21 ENCOUNTER — Other Ambulatory Visit: Payer: Self-pay

## 2020-11-21 DIAGNOSIS — E785 Hyperlipidemia, unspecified: Secondary | ICD-10-CM

## 2020-11-23 ENCOUNTER — Ambulatory Visit: Payer: 59 | Admitting: Podiatry

## 2020-12-09 NOTE — Progress Notes (Deleted)
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 Earlean Shawl Fair prep, mild chronic proctitis, anal fissure Repeat in 3 years, rec [place] x 1 month  EGD 2018: Dr. Earlean Shawl, Oct 2018 Short segment Barrett's, 4 cm hiatal hernia  Hemorrhoid banding 2019  Atypical appearing chronic anal fissure, 2021  Colonoscopy 2014, tubular adenoma (inferred from prior notes, no report available)

## 2020-12-20 ENCOUNTER — Ambulatory Visit: Payer: 59 | Admitting: Gastroenterology

## 2021-01-30 ENCOUNTER — Encounter: Payer: Self-pay | Admitting: Internal Medicine

## 2021-01-30 ENCOUNTER — Ambulatory Visit (INDEPENDENT_AMBULATORY_CARE_PROVIDER_SITE_OTHER): Payer: 59 | Admitting: Internal Medicine

## 2021-01-30 ENCOUNTER — Other Ambulatory Visit: Payer: Self-pay

## 2021-01-30 VITALS — BP 130/72 | HR 71 | Temp 98.1°F | Ht 74.0 in | Wt 222.4 lb

## 2021-01-30 DIAGNOSIS — M7062 Trochanteric bursitis, left hip: Secondary | ICD-10-CM | POA: Diagnosis not present

## 2021-01-30 DIAGNOSIS — R739 Hyperglycemia, unspecified: Secondary | ICD-10-CM | POA: Diagnosis not present

## 2021-01-30 DIAGNOSIS — M722 Plantar fascial fibromatosis: Secondary | ICD-10-CM | POA: Diagnosis not present

## 2021-01-30 DIAGNOSIS — I10 Essential (primary) hypertension: Secondary | ICD-10-CM

## 2021-01-30 DIAGNOSIS — M7061 Trochanteric bursitis, right hip: Secondary | ICD-10-CM

## 2021-01-30 DIAGNOSIS — F419 Anxiety disorder, unspecified: Secondary | ICD-10-CM | POA: Diagnosis not present

## 2021-01-30 DIAGNOSIS — M25551 Pain in right hip: Secondary | ICD-10-CM

## 2021-01-30 LAB — COMPREHENSIVE METABOLIC PANEL
ALT: 21 U/L (ref 0–53)
AST: 26 U/L (ref 0–37)
Albumin: 4.3 g/dL (ref 3.5–5.2)
Alkaline Phosphatase: 50 U/L (ref 39–117)
BUN: 14 mg/dL (ref 6–23)
CO2: 30 mEq/L (ref 19–32)
Calcium: 9.4 mg/dL (ref 8.4–10.5)
Chloride: 102 mEq/L (ref 96–112)
Creatinine, Ser: 1.06 mg/dL (ref 0.40–1.50)
GFR: 78.77 mL/min (ref >60.00)
Glucose, Bld: 94 mg/dL (ref 70–99)
Potassium: 3.6 mEq/L (ref 3.5–5.1)
Sodium: 139 mEq/L (ref 135–145)
Total Bilirubin: 0.7 mg/dL (ref 0.2–1.2)
Total Protein: 6.8 g/dL (ref 6.0–8.3)

## 2021-01-30 LAB — HEMOGLOBIN A1C: Hgb A1c MFr Bld: 5.3 % (ref 4.6–6.5)

## 2021-01-30 MED ORDER — LIDOCAINE-EPINEPHRINE 2 %-1:100000 IJ SOLN
3.0000 mL | Freq: Once | INTRAMUSCULAR | Status: AC
  Administered 2021-01-30: 3 mL

## 2021-01-30 MED ORDER — METHYLPREDNISOLONE ACETATE 40 MG/ML IJ SUSP
40.0000 mg | Freq: Once | INTRAMUSCULAR | Status: AC
  Administered 2021-01-30: 40 mg via INTRAMUSCULAR

## 2021-01-30 MED ORDER — TIRZEPATIDE 2.5 MG/0.5ML ~~LOC~~ SOAJ: 2.5000 mg | SUBCUTANEOUS | 2 refills | Status: DC

## 2021-01-30 NOTE — Patient Instructions (Signed)
Postprocedure instructions :    A Band-Aid should be left on for 12 hours. Injection therapy is not a cure itself. It is used in conjunction with other modalities. You can use nonsteroidal anti-inflammatories like ibuprofen , hot and cold compresses. Rest is recommended in the next 24 hours. You need to report immediately  if fever, chills or any signs of infection develop. 

## 2021-01-30 NOTE — Progress Notes (Signed)
Subjective:  Patient ID: Glen [name], male    DOB: 01-04-1966  Age: 55 y.o. MRN: [id number]  CC: Follow-up (Discuss something weight loss )   HPI Glen [name] presents for wt gain and hyperglycemia. She is complaining of chronic pain in the left foot and left calf.  He recently saw Dr. Doran Durand for consultation. Follow-up on hypertension, anxiety He is complaining of left hip pain, requesting a steroid injection    Outpatient Medications Prior to Visit  Medication Sig Dispense Refill   ALPRAZolam (XANAX) 0.25 MG tablet Take 1 tablet (0.25 mg total) by mouth 2 (two) times daily as needed for anxiety (Panic attacks). 60 tablet 3   Cholecalciferol (VITAMIN D3) 2000 units capsule Take 1 capsule (2,000 Units total) by mouth daily. 100 capsule 3   citalopram (CELEXA) 20 MG tablet Take 0.5 tablets (10 mg total) by mouth daily. 90 tablet 3   docusate sodium (COLACE) 100 MG capsule Take 1 capsule (100 mg total) by mouth 2 (two) times daily. 60 capsule 0   doxycycline (VIBRA-TABS) 100 MG tablet Take 1 tablet (100 mg total) by mouth 2 (two) times daily. 28 tablet 0   Ginger, Zingiber officinalis, (GINGER PO) Take 1 tablet by mouth daily.     omeprazole (PRILOSEC) 40 MG capsule Take 1 capsule (40 mg total) by mouth daily. 90 capsule 3   Turmeric (QC TUMERIC COMPLEX PO) Take 1 tablet by mouth daily.     valACYclovir (VALTREX) 500 MG tablet TAKE 1 TABLET BY MOUTH TWICE DAILY AS NEEDED FOR FEVER BLISTERS FOR 5 DAYS (Patient taking differently: Take 500 mg by mouth daily as needed (fever blisters).) 30 tablet 0   valsartan (DIOVAN) 80 MG tablet Take 1 tablet (80 mg total) by mouth daily. 30 tablet 11   ZOMIG 5 MG nasal solution USE ONE DOSE IN NOSTRIL AS NEEDED FOR MIGRAINE (Patient taking differently: Place 1 spray into the nose as needed for migraine.) 6 Units 3   No facility-administered medications prior to visit.    ROS: Review of Systems  Constitutional:  Negative for appetite change,  fatigue and unexpected weight change.  HENT:  Negative for congestion, nosebleeds, sneezing, sore throat and trouble swallowing.   Eyes:  Negative for itching and visual disturbance.  Respiratory:  Negative for cough.   Cardiovascular:  Negative for chest pain, palpitations and leg swelling.  Gastrointestinal:  Negative for abdominal distention, blood in stool, diarrhea and nausea.  Genitourinary:  Negative for frequency and hematuria.  Musculoskeletal:  Negative for back pain, gait problem, joint swelling and neck pain.  Skin:  Negative for rash.  Neurological:  Negative for dizziness, tremors, speech difficulty and weakness.  Psychiatric/Behavioral:  Negative for agitation, dysphoric mood, sleep disturbance and suicidal ideas. The patient is not nervous/anxious.    Objective:  BP 130/72 (BP Location: Left Arm)   Pulse 71   Temp 98.1 F (36.7 C) (Oral)   Ht '6\' 2"'$  (1.88 m)   Wt 222 lb 6.4 oz (100.9 kg)   SpO2 97%   BMI 28.55 kg/m   BP Readings from Last 3 Encounters:  01/30/21 130/72  10/31/20 118/72  10/18/20 138/72    Wt Readings from Last 3 Encounters:  01/30/21 222 lb 6.4 oz (100.9 kg)  10/31/20 212 lb 3.2 oz (96.3 kg)  10/18/20 215 lb 3.2 oz (97.6 kg)    Physical Exam Constitutional:      General: He is not in acute distress.    Appearance: He is  well-developed.     Comments: NAD  Eyes:     Conjunctiva/sclera: Conjunctivae normal.     Pupils: Pupils are equal, round, and reactive to light.  Neck:     Thyroid: No thyromegaly.     Vascular: No JVD.  Cardiovascular:     Rate and Rhythm: Normal rate and regular rhythm.     Heart sounds: Normal heart sounds. No murmur heard.   No friction rub. No gallop.  Pulmonary:     Effort: Pulmonary effort is normal. No respiratory distress.     Breath sounds: Normal breath sounds. No wheezing or rales.  Chest:     Chest wall: No tenderness.  Abdominal:     General: Bowel sounds are normal. There is no distension.      Palpations: Abdomen is soft. There is no mass.     Tenderness: There is no abdominal tenderness. There is no guarding or rebound.  Musculoskeletal:        General: No tenderness. Normal range of motion.     Cervical back: Normal range of motion.  Lymphadenopathy:     Cervical: No cervical adenopathy.  Skin:    General: Skin is warm and dry.     Findings: No rash.  Neurological:     Mental Status: He is alert and oriented to person, place, and time.     Cranial Nerves: No cranial nerve deficit.     Motor: No abnormal muscle tone.     Coordination: Coordination normal.     Gait: Gait normal.     Deep Tendon Reflexes: Reflexes are normal and symmetric.  Psychiatric:        Behavior: Behavior normal.        Thought Content: Thought content normal.        Judgment: Judgment normal.   L lat hip w/pain   Procedure Note :     Procedure : Joint Injection,  L  hip   Indication:  Trochanteric bursitis with refractory  chronic pain.   Risks including unsuccessful procedure , bleeding, infection, bruising, skin atrophy, "steroid flare-up" and others were explained to the patient in detail as well as the benefits. Informed consent was obtained verbally.  Tthe patient was placed in a comfortable lateral decubitus position. The point of maximal tenderness was identified. Skin was prepped with Betadine and alcohol. Then, a 5 cc syringe with a 2 inch long 24-gauge needle was used for a bursa injection.. The needle was advanced  Into the bursa. I injected the bursa with 4 mL of 2% lidocaine and 40 mg of Depo-Medrol .  Band-Aid was applied.   Tolerated well. Complications: None. Good pain relief following the procedure.     Lab Results  Component Value Date   WBC 5.1 08/31/2020   HGB 15.1 08/31/2020   HCT 43.9 08/31/2020   PLT 247 08/31/2020   GLUCOSE 100 (H) 08/31/2020   CHOL 198 08/08/2020   TRIG 66.0 08/08/2020   HDL 53.40 08/08/2020   LDLDIRECT 150.6 06/29/2013   LDLCALC 132 (H)  08/08/2020   ALT 19 08/31/2020   AST 22 08/31/2020   NA 137 08/31/2020   K 4.1 08/31/2020   CL 105 08/31/2020   CREATININE 0.85 08/31/2020   BUN 10 08/31/2020   CO2 26 08/31/2020   TSH 1.11 08/08/2020   PSA 1.08 08/08/2020   HGBA1C 5.3 12/02/2009    CT CARDIAC SCORING (SELF PAY ONLY)  Addendum Date: 11/21/2020   ADDENDUM REPORT: 11/21/2020 12:38 CLINICAL DATA:  Cardiovascular Disease Risk stratification EXAM: Coronary Calcium Score TECHNIQUE: A gated, non-contrast computed tomography scan of the heart was performed using 32m slice thickness. Axial images were analyzed on a dedicated workstation. Calcium scoring of the coronary arteries was performed using the Agatston method. FINDINGS: Coronary Calcium Score: Left main: 0 Left anterior descending artery: 0 Left circumflex artery: 0 Right coronary artery: 0 Total: 0 Percentile: 0 Pericardium: Normal. Ascending Aorta: Normal caliber. [facility]-cardiac: [facility]. IMPRESSION: Coronary calcium score of 0. This was 0 percentile for age-, race-, and sex-matched controls. RECOMMENDATIONS: Coronary artery calcium (CAC) score is a strong predictor of incident coronary heart disease (CHD) and provides predictive information beyond traditional risk factors. CAC scoring is reasonable to use in the decision to withhold, postpone, or initiate statin therapy in intermediate-risk or selected borderline-risk asymptomatic adults (age 56-75years and LDL-C >=70 to <190 mg/dL) who do not have diabetes or established atherosclerotic cardiovascular disease (ASCVD).* In intermediate-risk (10-year ASCVD risk >=7.5% to <20%) adults or selected borderline-risk (10-year ASCVD risk >=5% to <7.5%) adults in whom a CAC score is measured for the purpose of making a treatment decision the following recommendations have been made: If CAC=0, it is reasonable to withhold statin therapy and reassess in 5 to 10 years, as long as higher risk conditions are  absent (diabetes mellitus, family history of premature CHD in first degree relatives (males <55 years; females <65 years), cigarette smoking, or LDL >=190 mg/dL). If CAC is 1 to 99, it is reasonable to initiate statin therapy for patients >=566years of age. If CAC is >=100 or >=75th percentile, it is reasonable to initiate statin therapy at any age. Cardiology referral should be considered for patients with CAC scores >=400 or >=75th percentile. *2018 AHA/ACC/AACVPR/AAPA/ABC/ACPM/ADA/AGS/APhA/ASPC/NLA/PCNA Guideline on the Management of Blood Cholesterol: A Report of the American College of Cardiology/American Heart Association Task Force on Clinical Practice Guidelines. J Am Coll Cardiol. 2019;73(24):3168-3209. BKirk Ruths MD Electronically Signed   By: BKirk RuthsM.D.   On: 11/21/2020 12:38   Result Date: 11/21/2020 EXAM: OVER-READ INTERPRETATION  CT CHEST The following report is an over-read performed by radiologist Dr. KRolm Baptiseof [facility], PDobsonon 11/21/2020. This over-read does not include interpretation of cardiac or coronary anatomy or pathology. The coronary calcium score interpretation by the cardiologist is attached. COMPARISON:  None. FINDINGS: Vascular: Heart is normal size.  Aorta normal caliber. Mediastinum/Nodes: No adenopathy Lungs/Pleura: Visualized lungs clear.  No effusions. Upper Abdomen: Imaging into the upper abdomen demonstrates no acute findings. Musculoskeletal: Chest wall soft tissues are unremarkable. No acute bony abnormality. IMPRESSION: No acute or significant extracardiac abnormality. Electronically Signed: By: KRolm BaptiseM.D. On: 11/21/2020 08:43    Assessment & Plan:     AWalker Kehr MD

## 2021-01-30 NOTE — Assessment & Plan Note (Signed)
Continue to wear comfortable shoes, shoe inserts

## 2021-01-30 NOTE — Assessment & Plan Note (Addendum)
Pre-DM, probably worse with weight gain.  Obtain c-Met and hemoglobin A1c today Start [employer]

## 2021-01-30 NOTE — Assessment & Plan Note (Signed)
Worse on the left.  Given steroid injection.

## 2021-01-30 NOTE — Assessment & Plan Note (Signed)
Continue with Diovan.  Weight loss effort.  No added salt diet

## 2021-01-30 NOTE — Assessment & Plan Note (Signed)
Continue with alprazolam as needed Reduced Celexa to 1/2 a day due to wt gain Alprazolam as needed rare use  Potential benefits of a long term benzodiazepines  use as well as potential risks  and complications were explained to the patient and were aknowledged

## 2021-02-07 ENCOUNTER — Other Ambulatory Visit: Payer: Self-pay | Admitting: Internal Medicine

## 2021-02-07 MED ORDER — OZEMPIC (0.25 OR 0.5 MG/DOSE) 2 MG/1.5ML ~~LOC~~ SOPN: 0.2500 mg | PEN_INJECTOR | SUBCUTANEOUS | 3 refills | Status: DC

## 2021-02-07 NOTE — Telephone Encounter (Signed)
Pt states his insurance no longer covers Mounjaro. Is requesting to use Ozempic 2.'5mg'$ ; states it was discussed during OV on 01/30/21.  Pt is requesting prescription be sent today to pharmacy on file.

## 2021-02-13 ENCOUNTER — Telehealth: Payer: Self-pay | Admitting: Internal Medicine

## 2021-02-14 NOTE — Telephone Encounter (Signed)
Duplicate... Per patient - Discontinue the alternative we are able to get the Darcel Bayley ourselves the company issued [place] a $25 card are you still have to have active insurance in order to qualify even though I insurance does not cover it were able to get the prescription so Fara Olden had his ordered today the 5 mg and I had my 2.5 mg ordered today and it cost [place] both $25 each  when you up the milligram dosage we will have to call a new prescription in to sams I just want to make sure we're not gonna have an issue doing that .

## 2021-02-21 ENCOUNTER — Ambulatory Visit (INDEPENDENT_AMBULATORY_CARE_PROVIDER_SITE_OTHER): Payer: 59 | Admitting: Internal Medicine

## 2021-02-21 ENCOUNTER — Encounter: Payer: Self-pay | Admitting: Internal Medicine

## 2021-02-21 ENCOUNTER — Other Ambulatory Visit: Payer: Self-pay

## 2021-02-21 VITALS — BP 110/70 | HR 71 | Ht 74.0 in | Wt 219.0 lb

## 2021-02-21 DIAGNOSIS — K5120 Ulcerative (chronic) proctitis without complications: Secondary | ICD-10-CM | POA: Diagnosis not present

## 2021-02-21 DIAGNOSIS — K2270 Barrett's esophagus without dysplasia: Secondary | ICD-10-CM | POA: Diagnosis not present

## 2021-02-21 DIAGNOSIS — Z86010 Personal history of colonic polyps: Secondary | ICD-10-CM | POA: Diagnosis not present

## 2021-02-21 NOTE — Patient Instructions (Signed)
If you are age 56 or older, your body mass index should be between 23-30. Your Body mass index is 28.12 kg/m. If this is out of the aforementioned range listed, please consider follow up with your Primary Care Provider.  If you are age 89 or younger, your body mass index should be between 19-25. Your Body mass index is 28.12 kg/m. If this is out of the aformentioned range listed, please consider follow up with your Primary Care Provider.   __________________________________________________________  The [place] [facility] providers would like to encourage you to use [facility] to communicate with providers for non-urgent requests or questions.  Due to long hold times on the telephone, sending your provider a message by [facility] may be a faster and more efficient way to get a response.  Please allow 48 business hours for a response.  Please remember that this is for non-urgent requests.   You have been scheduled for a colonoscopy. Please follow written instructions given to you at your visit today.  Please pick up your prep supplies at the pharmacy within the next 1-3 days. If you use inhalers (even only as needed), please bring them with you on the day of your procedure.  We will request your EGD report from Dr Earlean Shawl.  I appreciate the opportunity to care for you. Silvano Rusk, MD, [facility]

## 2021-02-21 NOTE — Progress Notes (Signed)
Glen [name] 55 y.o. [birth year] [id number]  Assessment & Plan:   Encounter Diagnoses  Name Primary?   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  We will schedule a surveillance colonoscopy.  I would anticipate a 5-year follow-up for his EGD so 2026 (May) regarding Barrett's esophagus.  Fortunately his ulcerative proctitis is under control and his fissure and hemorrhoids are treated.  I appreciate the opportunity to care for this patient. CC: Plotnikov, Evie Lacks, MD     Subjective:   Chief Complaint:  HPI This 55 year old white man has a gastroenterology history of chronic ulcerative proctitis, anal fissure and GERD with Barrett's but no dysplasia previously followed by Dr. Richmond Campbell and last seen there in April 2021 (note reviewed). Other medical problems include asthma, prior hepatitis B infection, allergic rhinitis, anxiety, and hypothyroidism.  Procedural history as below.  Colonoscopy 2019 chronic proctitis with mild activity, fair prep EGD pathology Oct 26, 2019, squamocolumnar mucosa with intestinal metaplasia and chronic inflammation.  Short segment Barrett's 41 to 44 cm from the incisors, and mild nonerosive antritis. 2019 hemorrhoidal banding x3 2014 colonoscopy no polyps, chronic proctitis 2014 EGD Short segment Barrett's esophagus no dysplasia 2012 5 mm adenoma removed from colon at colonoscopy    He is feeling well at this time with no GERD symptoms or gastrointestinal problems at all.  He took treatment for ulcerative proctitis in the form of mesalamine for couple of years and has not needed any since.  He use diltiazem for an anal fissure.  He has had hemorrhoidal banding.  Regarding his previous colonoscopy history, in 2019 the prep  was only fair because he had run a long distance race prior to the colonoscopy and was dehydrated and because of that Dr. Earlean Shawl recommended an earlier interval on the colonoscopy i.e. 3 years.  Allergies  Allergen Reactions   Codeine Other (See Comments)    "Makes me really sick, I got hallucinations when the dose was really high" 08/13/20 Pt also states he is "very" sensitive to strong pain medications   Penicillins Other (See Comments)    "I don't remember, but mom said don't take" 08/13/20   Current Meds  Medication Sig   ALPRAZolam (XANAX) 0.25 MG tablet Take 1 tablet (0.25 mg total) by mouth 2 (two) times daily as needed for anxiety (Panic attacks).   citalopram (CELEXA) 20 MG tablet Take 0.5 tablets (10 mg total) by mouth daily.   docusate sodium (COLACE) 100 MG capsule Take 1 capsule (100 mg total) by mouth 2 (two) times daily.   doxycycline (VIBRA-TABS) 100 MG tablet Take 1 tablet (100 mg total) by mouth 2 (two) times daily.   Ginger, Zingiber officinalis, (GINGER PO) Take 1 tablet by mouth daily.   omeprazole (PRILOSEC) 40 MG capsule Take 1 capsule (40 mg total) by mouth daily.   Semaglutide,0.25 or 0.'5MG'$ /DOS, (OZEMPIC, 0.25 OR 0.5 MG/DOSE,) 2 MG/1.5ML SOPN Inject 0.25 mg into the skin once a week.   Turmeric (QC TUMERIC COMPLEX PO) Take 1 tablet by mouth daily.   valACYclovir (VALTREX) 500 MG tablet TAKE 1 TABLET BY MOUTH TWICE DAILY AS NEEDED FOR FEVER BLISTERS FOR 5 DAYS (Patient taking differently: Take 500 mg by mouth daily as needed (fever blisters).)   valsartan (DIOVAN) 80 MG  tablet Take 1 tablet (80 mg total) by mouth daily.   ZOMIG 5 MG nasal solution USE ONE DOSE IN NOSTRIL AS NEEDED FOR MIGRAINE (Patient taking differently: Place 1 spray into the nose as needed for migraine.)   Past Medical History:  Diagnosis Date   Allergic rhinitis    Anxiety    Arthritis    Asthmatic bronchitis    Barrett esophagus    Dry eyes    GERD (gastroesophageal reflux disease)     Headache    "prone to migraines"   Hepatitis B infection 2008   recovered   Hx of adenomatous polyp of colon 2012   Hypothyroidism 2010   Dr. Wende Bushy, been tested since and this is an inacurrate diagnosis   Internal hemorrhoids    PONV (postoperative nausea and vomiting)    Ulcerative proctitis ([place])    Urethritis 2010   Vitamin D deficiency    Past Surgical History:  Procedure Laterality Date   ANTERIOR CERVICAL DECOMP/DISCECTOMY FUSION N/A 08/31/2020   Procedure: ANTERIOR CERVICAL DECOMPRESSION/DISCECTOMY FUSION, INTERBODY PROSTHESIS, PLATE/SCREWS CERVICAL FIVE-SIX, CERVICAL SIX-SEVEN;  Surgeon: Newman Pies, MD;  Location: [facility];  Service: Neurosurgery;  Laterality: N/A;  ANTERIOR CERVICAL DECOMPRESSION/DISCECTOMY FUSION, INTERBODY PROSTHESIS, PLATE/SCREWS CERVICAL FIVE-SIX, CERVICAL SIX-SEVEN   COLONOSCOPY     multiple - adenoma 2012   ESOPHAGOGASTRODUODENOSCOPY     multiple - barrett's   FACIAL COSMETIC SURGERY     HEMORRHOID BANDING     [place]   LIPOSUCTION     TEAR DUCT PROBING  2009   Social History   Social History Narrative   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   Former smoker, 1 alcoholic beverage a day 2 caffeinated beverages a day no drug use or tobacco now   Regular Exercise- yes   family history includes Arthritis in his maternal grandmother and mother; Colon cancer in an other family member; Depression in an other family member; Esophageal cancer in an other family member; Heart disease (age of onset: 14) in his paternal grandfather; Heart disease (age of onset: 24) in his paternal uncle; Hypertension in an other family member; Liver cancer in an other family member; Lupus in his mother; Pancreatic cancer in an other family member; Stroke in his mother and another family member.   Review of Systems As per HPI seasonal allergy issues  Objective:   Physical Exam '@BP'$  110/70   Pulse 71   Ht '6\' 2"'$  (1.88 m)   Wt 219 lb (99.3  kg)   SpO2 97%   BMI 28.12 kg/m @  General:  NAD Eyes:   anicteric Lungs:  clear Heart::  S1S2 no rubs, murmurs or gallops Abdomen:  soft and nontender, BS+ no organomegaly or mass Neuro/mental status: Alert and oriented x3 appropriate mood and affect    Data Reviewed:  See HPI

## 2021-04-11 NOTE — Telephone Encounter (Signed)
Pt informed to drink the entire bottle. Pt stated that he did have to pre op instructions that were sent to him.  Pt was encouraged to refer to that. Pt verbalized understanding with all questions answered.

## 2021-04-20 ENCOUNTER — Other Ambulatory Visit: Payer: Self-pay

## 2021-04-20 ENCOUNTER — Ambulatory Visit (AMBULATORY_SURGERY_CENTER): Payer: 59 | Admitting: Internal Medicine

## 2021-04-20 ENCOUNTER — Encounter: Payer: Self-pay | Admitting: Internal Medicine

## 2021-04-20 VITALS — BP 118/75 | HR 62 | Temp 97.8°F | Resp 12 | Ht 74.0 in | Wt 219.0 lb

## 2021-04-20 DIAGNOSIS — D120 Benign neoplasm of cecum: Secondary | ICD-10-CM | POA: Diagnosis not present

## 2021-04-20 DIAGNOSIS — K635 Polyp of colon: Secondary | ICD-10-CM | POA: Diagnosis not present

## 2021-04-20 DIAGNOSIS — Z86010 Personal history of colonic polyps: Secondary | ICD-10-CM

## 2021-04-20 MED ORDER — SODIUM CHLORIDE 0.9 % IV SOLN: 500.0000 mL | Freq: Once | INTRAVENOUS | Status: DC

## 2021-04-20 NOTE — Op Note (Signed)
Glen [name] Patient Name: Glen [name] Procedure Date: 04/20/2021 8:43 AM MRN: [id number] Endoscopist: Gatha Mayer , MD Age: 55 Referring MD:  Date of Birth: 07-11-1965 Gender: Male Account #: [id number] Procedure:                Colonoscopy Indications:              Surveillance: Personal history of adenomatous                            polyps on last colonoscopy 3 years ago Medicines:                Propofol per Anesthesia, Monitored Anesthesia Care Procedure:                Pre-Anesthesia Assessment:                           - Prior to the procedure, a History and Physical                            was performed, and patient medications and                            allergies were reviewed. The patient's tolerance of                            previous anesthesia was also reviewed. The risks                            and benefits of the procedure and the sedation                            options and risks were discussed with the patient.                            All questions were answered, and informed consent                            was obtained. Prior Anticoagulants: The patient has                            taken no previous anticoagulant or antiplatelet                            agents. ASA Grade Assessment: II - A patient with                            mild systemic disease. After reviewing the risks                            and benefits, the patient was deemed in                            satisfactory condition to undergo the procedure.  After obtaining informed consent, the colonoscope                            was passed under direct vision. Throughout the                            procedure, the patient's blood pressure, pulse, and                            oxygen saturations were monitored continuously. The                            CF [id number] #[id number] was introduced through the anus                            and  advanced to the the cecum, identified by                            appendiceal orifice and ileocecal valve. The                            colonoscopy was performed without difficulty. The                            patient tolerated the procedure well. The quality                            of the bowel preparation was adequate. The bowel                            preparation used was Miralax via split dose                            instruction. The ileocecal valve, appendiceal                            orifice, and rectum were photographed. Scope In: 8:53:31 AM Scope Out: 9:09:08 AM Scope Withdrawal Time: 0 hours 12 minutes 49 seconds  Total Procedure Duration: 0 hours 15 minutes 37 seconds  Findings:                 The perianal and digital rectal examinations were                            normal.                           A 7 mm polyp was found in the cecum. The polyp was                            flat. The polyp was removed with a cold snare.                            Resection and retrieval were complete. Verification  of patient identification for the specimen was                            done. Estimated blood loss was minimal.                           small bandinmg scars were found in the rectum.                           The exam was otherwise without abnormality on                            direct and retroflexion views. Complications:            No immediate complications. Estimated Blood Loss:     Estimated blood loss was minimal. Impression:               - One 7 mm polyp in the cecum, removed with a cold                            snare. Resected and retrieved.                           - Scar in the rectum.                           - The examination was otherwise normal on direct                            and retroflexion views. ULCERATIVE PROCTITIS IN                            REMISSION                           - Personal  history of colonic polyp. Colonoscopy                            2019 chronic proctitis with mild activity, fair prep                           2014 colonoscopy no polyps, chronic proctitis                           2012 5 mm adenoma removed from colon at colonoscopy- Recommendation:           Patient has a contact number available for                            emergencies. The signs and symptoms of potential                            delayed complications were discussed with the                            patient. Return to  normal activities tomorrow.                            Written discharge instructions were provided to the                            patient.                           - Resume previous diet.                           - Continue present medications.                           - Await pathology results.                           - Repeat colonoscopy is recommended for                            surveillance. The colonoscopy date will be                            determined after pathology results from today's                            exam become available for review. Gatha Mayer, MD 04/20/2021 9:19:45 AM This report has been signed electronically.

## 2021-04-20 NOTE — Progress Notes (Signed)
[facility] History and Physical   Primary Care Physician:  Cassandria Anger, MD   Reason for Procedure:   Hx colon polyps  Plan:    colonoscopy     HPI: Glen [name] is a 55 y.o. male w/ hx proctitis, polyps and Barrett's esophagus here for a surveillance colonoscopy  Colonoscopy 2019 chronic proctitis with mild activity, fair prep EGD pathology Oct 26, 2019, squamocolumnar mucosa with intestinal metaplasia and chronic inflammation.  Short segment Barrett's 41 to 44 cm from the incisors, and mild nonerosive antritis. 2019 hemorrhoidal banding x3 2014 colonoscopy no polyps, chronic proctitis 2014 EGD Short segment Barrett's esophagus no dysplasia 2012 5 mm adenoma removed from colon at colonoscopy  Past Medical History:  Diagnosis Date   Allergic rhinitis    Anxiety    Arthritis    Asthmatic bronchitis    Barrett esophagus    Dry eyes    GERD (gastroesophageal reflux disease)    Headache    "prone to migraines"   Hepatitis B infection 2008   recovered   Hx of adenomatous polyp of colon 2012   Hypothyroidism 2010   Dr. Wende Bushy, been tested since and this is an inacurrate diagnosis   Internal hemorrhoids    PONV (postoperative nausea and vomiting)    Ulcerative proctitis ([place])    Urethritis 2010   Vitamin D deficiency     Past Surgical History:  Procedure Laterality Date   ANTERIOR CERVICAL DECOMP/DISCECTOMY FUSION N/A 08/31/2020   Procedure: ANTERIOR CERVICAL DECOMPRESSION/DISCECTOMY FUSION, INTERBODY PROSTHESIS, PLATE/SCREWS CERVICAL FIVE-SIX, CERVICAL SIX-SEVEN;  Surgeon: Newman Pies, MD;  Location: [facility];  Service: Neurosurgery;  Laterality: N/A;  ANTERIOR CERVICAL DECOMPRESSION/DISCECTOMY FUSION, INTERBODY PROSTHESIS, PLATE/SCREWS CERVICAL FIVE-SIX, CERVICAL SIX-SEVEN   COLONOSCOPY     multiple - adenoma 2012   ESOPHAGOGASTRODUODENOSCOPY     multiple - barrett's   FACIAL COSMETIC SURGERY     HEMORRHOID BANDING     [place]    LIPOSUCTION     TEAR DUCT PROBING  2009    Prior to Admission medications   Medication Sig Start Date End Date Taking? Authorizing Provider  citalopram (CELEXA) 20 MG tablet Take 0.5 tablets (10 mg total) by mouth daily. 10/18/20  Yes Plotnikov, Evie Lacks, MD  Ginger, Zingiber officinalis, (GINGER PO) Take 1 tablet by mouth daily.   Yes [provider]  omeprazole (PRILOSEC) 40 MG capsule Take 1 capsule (40 mg total) by mouth daily. 10/18/20  Yes Plotnikov, Evie Lacks, MD  Turmeric (QC TUMERIC COMPLEX PO) Take 1 tablet by mouth daily.   Yes [provider]  valsartan (DIOVAN) 80 MG tablet Take 1 tablet (80 mg total) by mouth daily. 08/15/20  Yes Plotnikov, Evie Lacks, MD  ALPRAZolam (XANAX) 0.25 MG tablet Take 1 tablet (0.25 mg total) by mouth 2 (two) times daily as needed for anxiety (Panic attacks). 08/19/20   Plotnikov, Evie Lacks, MD  docusate sodium (COLACE) 100 MG capsule Take 1 capsule (100 mg total) by mouth 2 (two) times daily. 09/01/20   Newman Pies, MD  doxycycline (VIBRA-TABS) 100 MG tablet Take 1 tablet (100 mg total) by mouth 2 (two) times daily. 10/31/20   Plotnikov, Evie Lacks, MD  valACYclovir (VALTREX) 500 MG tablet TAKE 1 TABLET BY MOUTH TWICE DAILY AS NEEDED FOR FEVER BLISTERS FOR 5 DAYS Patient taking differently: Take 500 mg by mouth daily as needed (fever blisters). 02/17/20   Plotnikov, Evie Lacks, MD  ZOMIG 5 MG nasal solution USE ONE DOSE IN NOSTRIL AS  NEEDED FOR MIGRAINE Patient taking differently: Place 1 spray into the nose as needed for migraine. 01/25/16   Plotnikov, Evie Lacks, MD    Current Outpatient Medications  Medication Sig Dispense Refill   citalopram (CELEXA) 20 MG tablet Take 0.5 tablets (10 mg total) by mouth daily. 90 tablet 3   Ginger, Zingiber officinalis, (GINGER PO) Take 1 tablet by mouth daily.     omeprazole (PRILOSEC) 40 MG capsule Take 1 capsule (40 mg total) by mouth daily. 90 capsule 3   Turmeric (QC TUMERIC COMPLEX PO) Take 1 tablet  by mouth daily.     valsartan (DIOVAN) 80 MG tablet Take 1 tablet (80 mg total) by mouth daily. 30 tablet 11   ALPRAZolam (XANAX) 0.25 MG tablet Take 1 tablet (0.25 mg total) by mouth 2 (two) times daily as needed for anxiety (Panic attacks). 60 tablet 3   docusate sodium (COLACE) 100 MG capsule Take 1 capsule (100 mg total) by mouth 2 (two) times daily. 60 capsule 0   doxycycline (VIBRA-TABS) 100 MG tablet Take 1 tablet (100 mg total) by mouth 2 (two) times daily. 28 tablet 0   valACYclovir (VALTREX) 500 MG tablet TAKE 1 TABLET BY MOUTH TWICE DAILY AS NEEDED FOR FEVER BLISTERS FOR 5 DAYS (Patient taking differently: Take 500 mg by mouth daily as needed (fever blisters).) 30 tablet 0   ZOMIG 5 MG nasal solution USE ONE DOSE IN NOSTRIL AS NEEDED FOR MIGRAINE (Patient taking differently: Place 1 spray into the nose as needed for migraine.) 6 Units 3   Current Facility-Administered Medications  Medication Dose Route Frequency Provider Last Rate Last Admin   0.9 %  sodium chloride infusion  500 mL Intravenous Once Gatha Mayer, MD        Allergies as of 04/20/2021 - Review Complete 04/20/2021  Allergen Reaction Noted   Codeine Other (See Comments)    Penicillins Other (See Comments)     Family History  Problem Relation Age of Onset   Stroke Mother    Lupus Mother    Arthritis Mother    Heart disease Paternal Uncle 61       atrial fib   Arthritis Maternal Grandmother    Heart disease Paternal Grandfather 96       MI   Depression Other    Hypertension Other    Stroke Other    Pancreatic cancer Other    Liver cancer Other    Colon cancer Neg Hx    Esophageal cancer Neg Hx    Stomach cancer Neg Hx    Rectal cancer Neg Hx     Social History   Socioeconomic History   Marital status: Married    Spouse name: Not on file   Number of children: Not on file   Years of education: Not on file   Highest education level: Not on file  Occupational History   Not on file  Tobacco Use    Smoking status: Former    Packs/day: 1.50    Types: Cigarettes    Quit date: 06/19/1999    Years since quitting: 21.8   Smokeless tobacco: Never  Vaping Use   Vaping Use: Never used  Substance and Sexual Activity   Alcohol use: Not Currently   Drug use: No   Sexual activity: Yes  Other Topics Concern   Not on file  Social History Narrative   The patient is married to [place] 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  Physical Exam: Vital signs BP 125/71   Pulse 65   Temp 97.8 F (36.6 C)   Ht 6\' 2"  (1.88 m)   Wt 219 lb (99.3 kg)   SpO2 100%   BMI 28.12 kg/m   General:   Alert,  Well-developed, well-nourished, pleasant and cooperative in NAD Lungs:  Clear throughout to auscultation.   Heart:  Regular rate and rhythm; no murmurs, clicks, rubs,  or gallops. Abdomen:  Soft, nontender and nondistended. Normal bowel sounds.   Neuro/Psych:  Alert and cooperative. Normal mood and affect. A and O x 3   @Kendel Bessey  Simonne Maffucci, MD, [facility] [phone] (pager) 04/20/2021 8:42 AM@

## 2021-04-20 NOTE — Progress Notes (Signed)
VS taken by C.W. 

## 2021-04-20 NOTE — Patient Instructions (Addendum)
Handout on polyps given to patient. Await pathology results. Resume previous diet and continue present medications.  I removed one suspected polyp. All else ok.  I will let you know pathology results and when to have another routine colonoscopy by mail and/or My Chart.  I appreciate the opportunity to care for you. Gatha Mayer, MD, FACG   YOU HAD AN ENDOSCOPIC PROCEDURE TODAY AT [facility]:   Refer to the procedure report that was given to you for any specific questions about what was found during the examination.  If the procedure report does not answer your questions, please call your gastroenterologist to clarify.  If you requested that your care partner not be given the details of your procedure findings, then the procedure report has been included in a sealed envelope for you to review at your convenience later.  YOU SHOULD EXPECT: Some feelings of bloating in the abdomen. Passage of more gas than usual.  Walking can help get rid of the air that was put into your GI tract during the procedure and reduce the bloating. If you had a lower endoscopy (such as a colonoscopy or flexible sigmoidoscopy) you may notice spotting of blood in your stool or on the toilet paper. If you underwent a bowel prep for your procedure, you may not have a normal bowel movement for a few days.  Please Note:  You might notice some irritation and congestion in your nose or some drainage.  This is from the oxygen used during your procedure.  There is no need for concern and it should clear up in a day or so.  SYMPTOMS TO REPORT IMMEDIATELY:  Following lower endoscopy (colonoscopy or flexible sigmoidoscopy):  Excessive amounts of blood in the stool  Significant tenderness or worsening of abdominal pains  Swelling of the abdomen that is new, acute  Fever of 100F or higher  For urgent or emergent issues, a gastroenterologist can be reached at any hour by calling [phone]. Do not use  MyChart messaging for urgent concerns.    DIET:  We do recommend a small meal at first, but then you may proceed to your regular diet.  Drink plenty of fluids but you should avoid alcoholic beverages for 24 hours.  ACTIVITY:  You should plan to take it easy for the rest of today and you should NOT DRIVE or use heavy machinery until tomorrow (because of the sedation medicines used during the test).    FOLLOW UP: Our staff will call the number listed on your records 48-72 hours following your procedure to check on you and address any questions or concerns that you may have regarding the information given to you following your procedure. If we do not reach you, we will leave a message.  We will attempt to reach you two times.  During this call, we will ask if you have developed any symptoms of COVID 19. If you develop any symptoms (ie: fever, flu-like symptoms, shortness of breath, cough etc.) before then, please call [phone].  If you test positive for Covid 19 in the 2 weeks post procedure, please call and report this information to [place].    If any biopsies were taken you will be contacted by phone or by letter within the next 1-3 weeks.  Please call us at [phone] if you have not heard about the biopsies in 3 weeks.    SIGNATURES/CONFIDENTIALITY: You and/or your care partner have signed paperwork which will be entered into your electronic  medical record.  These signatures attest to the fact that that the information above on your After Visit Summary has been reviewed and is understood.  Full responsibility of the confidentiality of this discharge information lies with you and/or your care-partner.  

## 2021-04-20 NOTE — Progress Notes (Signed)
Sedate, gd SR, tolerated procedure well, VSS, report to RN 

## 2021-04-24 ENCOUNTER — Encounter: Payer: Self-pay | Admitting: Internal Medicine

## 2021-04-24 ENCOUNTER — Telehealth: Payer: Self-pay

## 2021-04-24 NOTE — Telephone Encounter (Signed)
  Follow up Call-  Call back number 04/20/2021  Post procedure Call Back phone  # [phone]  Permission to leave phone message Yes  Some recent data might be hidden     Patient questions:  Do you have a fever, pain , or abdominal swelling? No. Pain Score  0 *  Have you tolerated food without any problems? Yes.    Have you been able to return to your normal activities? Yes.    Do you have any questions about your discharge instructions: Diet   No. Medications  No. Follow up visit  No.  Do you have questions or concerns about your Care? No.  Actions: * If pain score is 4 or above: No action needed, pain <4. Have you developed a fever since your procedure? no  2.   Have you had an respiratory symptoms (SOB or cough) since your procedure? no  3.   Have you tested positive for COVID 19 since your procedure no  4.   Have you had any family members/close contacts diagnosed with the COVID 19 since your procedure?  no   If yes to any of these questions please route to Joylene John, RN and Joella Prince, RN

## 2021-04-24 NOTE — Telephone Encounter (Signed)
Attempted to reach patient for post-procedure f/u call. No answer. Left message that staff will make another attempt to reach him later today and for him to please not hesitate to call us if he has any questions/concerns regarding his care. 

## 2021-06-02 ENCOUNTER — Telehealth: Payer: Self-pay | Admitting: *Deleted

## 2021-06-02 NOTE — Telephone Encounter (Signed)
Rec'd PA for Mounjaro 2.5/0.5 mg completed via cover-my-meds w/ (Key: [id number]). Waiting on insurance determination.Marland KitchenJohny Chess

## 2021-06-05 NOTE — Telephone Encounter (Signed)
Rec'd determination letter med was denied .. it states does not meet guideline rules for this medication. Must have tried and failed 3 alternative.. Bydureon, Byetta, Ozempic, Rybelsus, Trulicity and Victoza../l,mb

## 2021-06-06 ENCOUNTER — Encounter: Payer: Self-pay | Admitting: Internal Medicine

## 2021-06-06 NOTE — Telephone Encounter (Signed)
Glen [name] was unable to tolerate Mounjaro due to nausea and vomiting.  No need for PA.  Thanks

## 2021-08-10 ENCOUNTER — Encounter: Payer: Self-pay | Admitting: Internal Medicine

## 2021-08-10 ENCOUNTER — Other Ambulatory Visit: Payer: Self-pay | Admitting: Internal Medicine

## 2021-08-24 ENCOUNTER — Other Ambulatory Visit: Payer: Self-pay

## 2021-08-24 ENCOUNTER — Encounter: Payer: Self-pay | Admitting: Orthopaedic Surgery

## 2021-08-24 ENCOUNTER — Ambulatory Visit (INDEPENDENT_AMBULATORY_CARE_PROVIDER_SITE_OTHER): Payer: 59

## 2021-08-24 ENCOUNTER — Ambulatory Visit (INDEPENDENT_AMBULATORY_CARE_PROVIDER_SITE_OTHER): Payer: 59 | Admitting: Orthopaedic Surgery

## 2021-08-24 DIAGNOSIS — M25572 Pain in left ankle and joints of left foot: Secondary | ICD-10-CM

## 2021-08-24 DIAGNOSIS — M1812 Unilateral primary osteoarthritis of first carpometacarpal joint, left hand: Secondary | ICD-10-CM

## 2021-08-24 NOTE — Progress Notes (Signed)
? ?Office Visit Note ?  ?Patient: Glen [name]           ?Date of Birth: August 01, 1965           ?MRN: [id number] ?Visit Date: 08/24/2021 ?             ?Requested by: Glen Anger, MD ?[name],  [place] [id number] ?PCP: Plotnikov, Evie Lacks, MD ? ? ?Assessment & Plan: ?Visit Diagnoses:  ?1. Primary osteoarthritis of first carpometacarpal joint of left hand   ?2. Pain in left ankle and joints of left foot   ? ? ?Plan: In regards to the left thumb he has basal joint CMC arthritis.  We will begin with CMC brace and topical Voltaren gel.  We will hold off on injection.  He cannot take p.o. NSAIDs.  For the ankle he has had pain for over a year therefore we will obtain MRI to rule out structural abnormalities.  Based on previous x-rays I do see some evidence of arthritis. ? ?Follow-Up Instructions: No follow-ups on file.  ? ?Orders:  ?Orders Placed This Encounter  ?Procedures  ? XR Finger Thumb Left  ? ?No orders of the defined types were placed in this encounter. ? ? ? ? Procedures: ?No procedures performed ? ? ?Clinical Data: ?No additional findings. ? ? ?Subjective: ?Chief Complaint  ?Patient presents with  ? Left Wrist - Pain  ? Left Thumb - Pain  ? Left Foot - Pain  ? ? ?HPI ? ?Glen [name] is a 56 year old gentleman who I know from the barbershop who comes in for evaluation to different issues.  The first problem is left thumb pain at the base that is worse with the use of the hand.  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  Denies any numbness and tingling or triggering.  Second problem is chronic left ankle pain.  He had plantar fascia surgery about a year ago and he has seen Dr. Doran [name] in the past for this condition.  He continues to have anterior ankle pain that is worse with weightbearing, ambulation, ankle range of motion. ? ?Review of Systems ? ? ?Objective: ?Vital Signs: There were no vitals taken for this visit. ? ?Physical Exam ? ?Ortho Exam ? ?Examination of the left  thumb shows pain with grind test.  There is no crepitus.  Negative Finkelstein's.  There is no triggering of the thumb.  Thumb range of motion is well-preserved. ? ?Examination of the left ankle shows no significant swelling.  He has good range of motion of the ankle and subtalar joint.  He reports pain across the anterior aspect of the ankle. ? ?Specialty Comments:  ?No specialty comments available. ? ?Imaging: ?XR Finger Thumb Left ? ?Result Date: 08/24/2021 ?Mild to moderate CMC arthritis.  ? ? ?PMFS History: ?Patient Active Problem List  ? Diagnosis Date Noted  ? Pain in left ankle and joints of left foot 08/24/2021  ? Primary osteoarthritis of first carpometacarpal joint of left hand 08/24/2021  ? Hyperglycemia 01/30/2021  ? HTN (hypertension) 01/30/2021  ? Weight gain 10/18/2020  ? Barrett's esophagus 09/22/2020  ? 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 Cervical spondylosis with radiculopathy 08/31/2020  ? Chest tightness 08/16/2020  ? Neck pain 08/08/2020  ? Motion sickness 05/18/2020  ? Shoulder pain, left 05/03/2020  ? Plantar fasciitis of left foot 01/18/2020  ? Wrist arthritis 01/18/2020  ? Dyslipidemia 03/09/2019  ? Otitis media 08/04/2018  ?  Acute sinusitis 08/04/2018  ? Anal fissure 01/13/2018  ? Shoulder pain, right 01/13/2018  ? Ankle edema 11/25/2017  ? Trochanteric bursitis of left hip 11/25/2017  ? Cough 08/24/2016  ? Wheezing 08/24/2016  ? Inflammatory arthritis 07/13/2016  ? Primary osteoarthritis of both hands 07/13/2016  ? Primary osteoarthritis of both feet 07/13/2016  ? Spondylosis of lumbar region without myelopathy or radiculopathy 07/13/2016  ? Trochanteric bursitis of both hips 07/13/2016  ? History of hepatitis B 07/13/2016  ? Acute bronchitis 07/06/2016  ? Constipation 07/12/2015  ? Vitamin D deficiency 07/12/2015  ? Pain of both heels 11/03/2013  ? Arthritis pain, hand 11/17/2012  ? Mouth sores 11/17/2012  ? Blood pressure elevated without history of HTN 07/07/2012  ?  Palpitations 04/07/2012  ? Patellar tendonitis, right 11/07/2011  ? Well adult exam 03/05/2011  ? Hx of adenomatous polyp of colon 11/03/2010  ? ABDOMINAL PAIN, GENERALIZED 06/13/2010  ? HEMORRHOIDS 05/22/2010  ? RECTAL PAIN 05/22/2010  ? HEMATOCHEZIA 05/22/2010  ? HYPOGLYCEMIA 12/01/2009  ? HYPOGONADISM 12/01/2009  ? LOW BACK PAIN, ACUTE 12/01/2009  ? HERPES SIMPLEX WITHOUT MENTION OF COMPLICATION 54/02/8118  ? PANIC DISORDER 11/24/2009  ? DEPRESSIVE DISORDER 11/24/2009  ? GERD 09/13/2009  ? PES PLANUS 03/16/2009  ? REACTIVE AIRWAY DISEASE 03/09/2009  ? SNORING 03/09/2009  ? Anxiety disorder 02/24/2009  ? HYPOTHYROIDISM 10/25/2008  ? BACK PAIN 08/23/2008  ? Pain in joint, lower leg 12/29/2007  ? MRSA INFECTION 01/21/2007  ? HEPATITIS B 01/21/2007  ? ALLERGIC RHINITIS 01/21/2007  ? ABDOMINAL WALL HERNIA 01/21/2007  ? ?Past Medical History:  ?Diagnosis Date  ? Allergic rhinitis   ? Anxiety   ? Arthritis   ? Asthmatic bronchitis   ? Barrett esophagus   ? Dry eyes   ? GERD (gastroesophageal reflux disease)   ? Headache   ? "prone to migraines"  ? Hepatitis B infection 2008  ? recovered  ? Hx of adenomatous polyp of colon 2012  ? Hypothyroidism 2010  ? Dr. Wende Bushy, been tested since and this is an inacurrate diagnosis  ? Internal hemorrhoids   ? PONV (postoperative nausea and vomiting)   ? Ulcerative proctitis ([place])   ? Urethritis 2010  ? Vitamin D deficiency   ?  ?Family History  ?Problem Relation Age of Onset  ? Stroke Mother   ? Lupus Mother   ? Arthritis Mother   ? Heart disease Paternal Uncle 23  ?     atrial fib  ? Arthritis Maternal Grandmother   ? Heart disease Paternal Grandfather 45  ?     MI  ? Depression Other   ? Hypertension Other   ? Stroke Other   ? Pancreatic cancer Other   ? Liver cancer Other   ? Colon cancer Neg Hx   ? Esophageal cancer Neg Hx   ? Stomach cancer Neg Hx   ? Rectal cancer Neg Hx   ?  ?Past Surgical History:  ?Procedure Laterality Date  ? ANTERIOR CERVICAL DECOMP/DISCECTOMY FUSION  N/A 08/31/2020  ? Procedure: ANTERIOR CERVICAL DECOMPRESSION/DISCECTOMY FUSION, INTERBODY PROSTHESIS, PLATE/SCREWS CERVICAL FIVE-SIX, CERVICAL SIX-SEVEN;  Surgeon: Newman Pies, MD;  Location: [facility];  Service: Neurosurgery;  Laterality: N/A;  ANTERIOR CERVICAL DECOMPRESSION/DISCECTOMY FUSION, INTERBODY PROSTHESIS, PLATE/SCREWS CERVICAL FIVE-SIX, CERVICAL SIX-SEVEN  ? COLONOSCOPY    ? multiple - adenoma 2012  ? ESOPHAGOGASTRODUODENOSCOPY    ? multiple - barrett's  ? FACIAL COSMETIC SURGERY    ? HEMORRHOID BANDING    ? Medoff  ? LIPOSUCTION    ?  TEAR DUCT PROBING  2009  ? ?Social History  ? ?Occupational History  ? Not on file  ?Tobacco Use  ? Smoking status: Former  ?  Packs/day: 1.50  ?  Types: Cigarettes  ?  Quit date: 06/19/1999  ?  Years since quitting: 22.1  ? Smokeless tobacco: Never  ?Vaping Use  ? Vaping Use: Never used  ?Substance and Sexual Activity  ? Alcohol use: Not Currently  ? Drug use: No  ? Sexual activity: Yes  ? ? ? ? ? ? ?

## 2021-09-04 ENCOUNTER — Other Ambulatory Visit: Payer: Self-pay

## 2021-09-04 ENCOUNTER — Ambulatory Visit (HOSPITAL_COMMUNITY)
Admission: RE | Admit: 2021-09-04 | Discharge: 2021-09-04 | Disposition: A | Discharge status: Home / Self Care | Payer: 59 | Source: Ambulatory Visit | Attending: Orthopaedic Surgery | Admitting: Orthopaedic Surgery

## 2021-09-04 DIAGNOSIS — M25572 Pain in left ankle and joints of left foot: Secondary | ICD-10-CM | POA: Diagnosis present

## 2021-09-05 ENCOUNTER — Other Ambulatory Visit: Payer: Self-pay | Admitting: Surgery

## 2021-09-07 ENCOUNTER — Encounter: Payer: Self-pay | Admitting: Internal Medicine

## 2021-09-12 ENCOUNTER — Encounter: Payer: Self-pay | Admitting: Orthopaedic Surgery

## 2021-09-12 ENCOUNTER — Other Ambulatory Visit: Payer: Self-pay

## 2021-09-12 ENCOUNTER — Ambulatory Visit (INDEPENDENT_AMBULATORY_CARE_PROVIDER_SITE_OTHER): Payer: 59 | Admitting: Orthopaedic Surgery

## 2021-09-12 DIAGNOSIS — M25572 Pain in left ankle and joints of left foot: Secondary | ICD-10-CM | POA: Diagnosis not present

## 2021-09-12 NOTE — Progress Notes (Signed)
? ?Office Visit Note ?  ?Patient: Glen [name]           ?Date of Birth: 05/04/1966           ?MRN: [id number] ?Visit Date: 09/12/2021 ?             ?Requested by: Cassandria Anger, MD ?[place],  [place] [id number] ?PCP: Plotnikov, Evie Lacks, MD ? ? ?Assessment & Plan: ?Visit Diagnoses:  ?1. Pain in left ankle and joints of left foot   ? ? ?Plan: [name] is here to discuss MRI of the left ankle.  Examination is unchanged. ? ?MRI shows mild diffuse degenerative changes of the midfoot.  No structural abnormalities or degenerative changes of the ankle or subtalar joints.  Mild tenosynovitis of the posterior tibial tendon and the peroneus longus.  Mild subchondral edema of the calcaneus near the plantar fascia insertion.  Overall the plantar fascia appears intact.  These findings were reviewed in detail and treatment options were discussed.  I do not see a real surgical problem at this time.  Recommend supportive orthotics which she uses daily and has provided good relief.  Reassurance provided that there are no structural problems that warrant surgery.  Questions encouraged and answered.  Follow-up as needed. ? ?Follow-Up Instructions: No follow-ups on file.  ? ?Orders:  ?No orders of the defined types were placed in this encounter. ? ?No orders of the defined types were placed in this encounter. ? ? ? ? Procedures: ?No procedures performed ? ? ?Clinical Data: ?No additional findings. ? ? ?Subjective: ?Chief Complaint  ?Patient presents with  ? Left Ankle - Follow-up  ?  MRI review  ? ? ?HPI ? ?Review of Systems ? ? ?Objective: ?Vital Signs: There were no vitals taken for this visit. ? ?Physical Exam ? ?Ortho Exam ? ?Specialty Comments:  ?No specialty comments available. ? ?Imaging: ?No results found. ? ? ?PMFS History: ?Patient Active Problem List  ? Diagnosis Date Noted  ? Pain in left ankle and joints of left foot 08/24/2021  ? Primary osteoarthritis of first carpometacarpal joint of left hand  08/24/2021  ? Hyperglycemia 01/30/2021  ? HTN (hypertension) 01/30/2021  ? Weight gain 10/18/2020  ? Barrett's esophagus 09/22/2020  ? 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 Cervical spondylosis with radiculopathy 08/31/2020  ? Chest tightness 08/16/2020  ? Neck pain 08/08/2020  ? Motion sickness 05/18/2020  ? Shoulder pain, left 05/03/2020  ? Plantar fasciitis of left foot 01/18/2020  ? Wrist arthritis 01/18/2020  ? Dyslipidemia 03/09/2019  ? Otitis media 08/04/2018  ? Acute sinusitis 08/04/2018  ? Anal fissure 01/13/2018  ? Shoulder pain, right 01/13/2018  ? Ankle edema 11/25/2017  ? Trochanteric bursitis of left hip 11/25/2017  ? Cough 08/24/2016  ? Wheezing 08/24/2016  ? Inflammatory arthritis 07/13/2016  ? Primary osteoarthritis of both hands 07/13/2016  ? Primary osteoarthritis of both feet 07/13/2016  ? Spondylosis of lumbar region without myelopathy or radiculopathy 07/13/2016  ? Trochanteric bursitis of both hips 07/13/2016  ? History of hepatitis B 07/13/2016  ? Acute bronchitis 07/06/2016  ? Constipation 07/12/2015  ? Vitamin D deficiency 07/12/2015  ? Pain of both heels 11/03/2013  ? Arthritis pain, hand 11/17/2012  ? Mouth sores 11/17/2012  ? Blood pressure elevated without history of HTN 07/07/2012  ? Palpitations 04/07/2012  ? Patellar tendonitis, right 11/07/2011  ? Well adult exam 03/05/2011  ? Hx of adenomatous polyp of colon 11/03/2010  ? ABDOMINAL PAIN, GENERALIZED  06/13/2010  ? HEMORRHOIDS 05/22/2010  ? RECTAL PAIN 05/22/2010  ? HEMATOCHEZIA 05/22/2010  ? HYPOGLYCEMIA 12/01/2009  ? HYPOGONADISM 12/01/2009  ? LOW BACK PAIN, ACUTE 12/01/2009  ? HERPES SIMPLEX WITHOUT MENTION OF COMPLICATION 17/79/3903  ? PANIC DISORDER 11/24/2009  ? DEPRESSIVE DISORDER 11/24/2009  ? GERD 09/13/2009  ? PES PLANUS 03/16/2009  ? REACTIVE AIRWAY DISEASE 03/09/2009  ? SNORING 03/09/2009  ? Anxiety disorder 02/24/2009  ? HYPOTHYROIDISM 10/25/2008  ? BACK PAIN 08/23/2008  ? Pain in joint, lower leg 12/29/2007   ? MRSA INFECTION 01/21/2007  ? HEPATITIS B 01/21/2007  ? ALLERGIC RHINITIS 01/21/2007  ? ABDOMINAL WALL HERNIA 01/21/2007  ? ?Past Medical History:  ?Diagnosis Date  ? Allergic rhinitis   ? Anxiety   ? Arthritis   ? Asthmatic bronchitis   ? Barrett esophagus   ? Dry eyes   ? GERD (gastroesophageal reflux disease)   ? Headache   ? "prone to migraines"  ? Hepatitis B infection 2008  ? recovered  ? Hx of adenomatous polyp of colon 2012  ? Hypothyroidism 2010  ? Dr. Wende Bushy, been tested since and this is an inacurrate diagnosis  ? Internal hemorrhoids   ? PONV (postoperative nausea and vomiting)   ? Ulcerative proctitis ([place])   ? Urethritis 2010  ? Vitamin D deficiency   ?  ?Family History  ?Problem Relation Age of Onset  ? Stroke Mother   ? Lupus Mother   ? Arthritis Mother   ? Heart disease Paternal Uncle 43  ?     atrial fib  ? Arthritis Maternal Grandmother   ? Heart disease Paternal Grandfather 6  ?     MI  ? Depression Other   ? Hypertension Other   ? Stroke Other   ? Pancreatic cancer Other   ? Liver cancer Other   ? Colon cancer Neg Hx   ? Esophageal cancer Neg Hx   ? Stomach cancer Neg Hx   ? Rectal cancer Neg Hx   ?  ?Past Surgical History:  ?Procedure Laterality Date  ? ANTERIOR CERVICAL DECOMP/DISCECTOMY FUSION N/A 08/31/2020  ? Procedure: ANTERIOR CERVICAL DECOMPRESSION/DISCECTOMY FUSION, INTERBODY PROSTHESIS, PLATE/SCREWS CERVICAL FIVE-SIX, CERVICAL SIX-SEVEN;  Surgeon: Newman Pies, MD;  Location: [facility];  Service: Neurosurgery;  Laterality: N/A;  ANTERIOR CERVICAL DECOMPRESSION/DISCECTOMY FUSION, INTERBODY PROSTHESIS, PLATE/SCREWS CERVICAL FIVE-SIX, CERVICAL SIX-SEVEN  ? COLONOSCOPY    ? multiple - adenoma 2012  ? ESOPHAGOGASTRODUODENOSCOPY    ? multiple - barrett's  ? FACIAL COSMETIC SURGERY    ? HEMORRHOID BANDING    ? Medoff  ? LIPOSUCTION    ? TEAR DUCT PROBING  2009  ? ?Social History  ? ?Occupational History  ? Not on file  ?Tobacco Use  ? Smoking status: Former  ?  Packs/day: 1.50  ?   Types: Cigarettes  ?  Quit date: 06/19/1999  ?  Years since quitting: 22.2  ? Smokeless tobacco: Never  ?Vaping Use  ? Vaping Use: Never used  ?Substance and Sexual Activity  ? Alcohol use: Not Currently  ? Drug use: No  ? Sexual activity: Yes  ? ? ? ? ? ? ?

## 2021-09-18 ENCOUNTER — Other Ambulatory Visit: Payer: Self-pay | Admitting: Internal Medicine

## 2021-10-04 ENCOUNTER — Telehealth: Payer: Self-pay | Admitting: Physical Medicine and Rehabilitation

## 2021-10-04 NOTE — Telephone Encounter (Signed)
Patient called needing to schedule an appointment with Dr. Ernestina Patches for his back. The number to contact patient is [phone]  ?

## 2021-10-10 ENCOUNTER — Encounter: Payer: Self-pay | Admitting: Physical Medicine and Rehabilitation

## 2021-10-10 ENCOUNTER — Ambulatory Visit (INDEPENDENT_AMBULATORY_CARE_PROVIDER_SITE_OTHER): Payer: 59 | Admitting: Physical Medicine and Rehabilitation

## 2021-10-10 VITALS — BP 121/84 | HR 76

## 2021-10-10 DIAGNOSIS — M5416 Radiculopathy, lumbar region: Secondary | ICD-10-CM | POA: Diagnosis not present

## 2021-10-10 DIAGNOSIS — M5116 Intervertebral disc disorders with radiculopathy, lumbar region: Secondary | ICD-10-CM | POA: Diagnosis not present

## 2021-10-10 DIAGNOSIS — M5442 Lumbago with sciatica, left side: Secondary | ICD-10-CM

## 2021-10-10 DIAGNOSIS — M4726 Other spondylosis with radiculopathy, lumbar region: Secondary | ICD-10-CM | POA: Diagnosis not present

## 2021-10-10 DIAGNOSIS — G8929 Other chronic pain: Secondary | ICD-10-CM

## 2021-10-10 NOTE — Progress Notes (Signed)
Pt state lower back pain that travels to his left hip and groin. Pt state bending, standing and laying makes the pain worse. Pt state he takes over the counter pain meds and uses ice to help ease his pain. ? ?Numeric Pain Rating Scale and Functional Assessment ?Average Pain 9 ?Pain Right Now 4 ?My pain is intermittent, sharp, burning, dull, and stabbing ?Pain is worse with: bending, standing, some activites, and laying down ?Pain improves with: heat/ice and medication ? ? ?In the last MONTH (on 0-10 scale) has pain interfered with the following? ? ?1. General activity like being  able to carry out your everyday physical activities such as walking, climbing stairs, carrying groceries, or moving a chair?  ?Rating(5) ? ?2. Relation with others like being able to carry out your usual social activities and roles such as  activities at home, at work and in your community. ?Rating(6) ? ?3. Enjoyment of life such that you have  been bothered by emotional problems such as feeling anxious, depressed or irritable?  ?Rating(7) ? ?

## 2021-10-10 NOTE — Progress Notes (Signed)
? ?Glen [name] - 56 y.o. male MRN [id number]  Date of birth: 03-03-1966 ? ?Office Visit Note: ?Visit Date: 10/10/2021 ?PCP: Plotnikov, Evie Lacks, MD ?Referred by: Cassandria Anger, MD ? ?Subjective: ?Chief Complaint  ?Patient presents with  ? Lower Back - Pain  ? Left Hip - Pain  ? ?HPI: Glen [name] is a 56 y.o. male who comes in today as a self referral for evaluation of chronic, worsening and severe bilateral lower back pain radiating around to left hip/groin region. Patient reports pain has been ongoing for several months and is exacerbated by bending, movement and activity. He describes his pain as a constant burning and sore sensation, currently rates as 7 out of 10. Patient reports some relief of pain with home exercise regimen, rest and use of over the counter medications as needed. Patient states he does take Tylenol as needed. Patients lumbar MRI from 2017 exhibits small right central-subarticular T12-L1 disc protrusion, right central disc protrusion at L3-L4 and small left central disc protrusion at L5-S1. No high grade spinal canal stenosis noted. Patient was previously treated by Dr. Suella Broad at [facility] where he underwent 2 lumbar epidural steroid injections with some relief of pain. Patient states he is unable to return to Dr. Nelva Bush as practice no longer takes his insurance. Patient denies history of lumbar surgery. Patient does have history of ACDF at the level of C5-C6 and C6-C7 performed by Dr. Newman Pies in 2022. 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 He reports pain has increased over the last several months. Patient denies focal weakness, numbness and tingling. Patient denies recent trauma or falls.  ? ?Review of Systems  ?Musculoskeletal:  Positive for back pain.  ?Neurological:  Negative for tingling, sensory change, focal weakness and weakness.  ?All other systems reviewed and are negative. Otherwise per  HPI. ? ?Assessment & Plan: ?Visit Diagnoses:  ?  ICD-10-CM   ?1. Lumbar radiculopathy  M54.16 Ambulatory referral to Physical Medicine Rehab  ?  ?2. Chronic bilateral low back pain with left-sided sciatica  M54.42   ? G89.29   ?  ?3. Other spondylosis with radiculopathy, lumbar region  M47.26   ?  ?4. Intervertebral disc disorders with radiculopathy, lumbar region  M51.16   ?  ?   ?Plan: Findings:  ?Chronic, worsening and severe bilateral lower back pain radiating around to left hip/groin region. Patient continues to have severe pain despite good conservative therapies such as home exercise regimen, rest and use of medications. Patients clinical presentation and exam are consistent with lumbar radiculopathy, we do feel his pain is associated with upper lumbar disc protrusions. We believe the next step is to perform a diagnostic and hopefully therapeutic left L3-L4 interlaminar epidural steroid injection under fluoroscopic guidance. Patient is not currently undergoing long term anticoagulant therapy. I did speak with patient about medication management an recommended he take 500 mg of Tylenol 3 times a day consistently for pain. If patients pain persists we would be quick to order new lumbar MRI imaging. Patient encouraged to remain active and continue with home exercise regimen. No red flag symptoms noted upon exam today.   ? ?Meds & Orders: No orders of the defined types were placed in this encounter. ?  ?Orders Placed This Encounter  ?Procedures  ? Ambulatory referral to Physical Medicine Rehab  ?  ?Follow-up: Return for Left L3-L4 interlaminar epidural steroid injection.  ? ?Procedures: ?No procedures performed  ?   ? ?  Clinical History: ?Lumbar MRI 05/21/2016 ? ?Impression: ?1. Small left central L5-S1 disc protrusion is mildly progressed without spinal canal stenosis or contact of descending nerve roots.  ?2. Small right central L3-L4 disc protrusion superimposed upon minimal bulging disc-osteophyte complex  without spinal canal stenosis or subarticular zone narrowing, unchanged. ?3. Stable to minimally progressed small right central-subarticular T12-L1 disc protrusion mildly narrows right subarticular zone contacting descending right L1 nerve roots without displacement or spinal canal stenosis.  ? ?He reports that he quit smoking about 22 years ago. His smoking use included cigarettes. He smoked an average of 1.5 packs per day. He has never used smokeless tobacco.  ?Recent Labs  ?  01/30/21 ?1359  ?HGBA1C 5.3  ? ? ?Objective:  VS:  HT:    WT:   BMI:     BP:121/84  HR:76bpm  TEMP: ( )  RESP:  ?Physical Exam ?Vitals and nursing note reviewed.  ?HENT:  ?   Head: Normocephalic and atraumatic.  ?   Right Ear: External ear normal.  ?   Left Ear: External ear normal.  ?   Nose: Nose normal.  ?   Mouth/Throat:  ?   Mouth: Mucous membranes are moist.  ?Eyes:  ?   Extraocular Movements: Extraocular movements intact.  ?Cardiovascular:  ?   Rate and Rhythm: Normal rate.  ?   Pulses: Normal pulses.  ?Pulmonary:  ?   Effort: Pulmonary effort is normal.  ?Abdominal:  ?   General: Abdomen is flat. There is no distension.  ?Musculoskeletal:     ?   General: Tenderness present.  ?   Cervical back: Normal range of motion.  ?   Comments: Pt rises from seated position to standing without difficulty. Good lumbar range of motion. Strong distal strength without clonus, no pain upon palpation of greater trochanters. Dysesthesias noted to left L1 dermatome. Sensation intact bilaterally. Walks independently, gait steady.   ?Skin: ?   General: Skin is warm and dry.  ?   Capillary Refill: Capillary refill takes less than 2 seconds.  ?Neurological:  ?   General: No focal deficit present.  ?   Mental Status: He is alert and oriented to person, place, and time.  ?Psychiatric:     ?   Mood and Affect: Mood normal.     ?   Behavior: Behavior normal.  ?  ?Ortho Exam ? ?Imaging: ?No results found. ? ?Past Medical/Family/Surgical/Social  History: ?Medications & Allergies reviewed per EMR, new medications updated. ?Patient Active Problem List  ? Diagnosis Date Noted  ? Pain in left ankle and joints of left foot 08/24/2021  ? Primary osteoarthritis of first carpometacarpal joint of left hand 08/24/2021  ? Hyperglycemia 01/30/2021  ? HTN (hypertension) 01/30/2021  ? Weight gain 10/18/2020  ? Barrett's esophagus 09/22/2020  ? 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 Cervical spondylosis with radiculopathy 08/31/2020  ? Chest tightness 08/16/2020  ? Neck pain 08/08/2020  ? Motion sickness 05/18/2020  ? Shoulder pain, left 05/03/2020  ? Plantar fasciitis of left foot 01/18/2020  ? Wrist arthritis 01/18/2020  ? Dyslipidemia 03/09/2019  ? Otitis media 08/04/2018  ? Acute sinusitis 08/04/2018  ? Anal fissure 01/13/2018  ? Shoulder pain, right 01/13/2018  ? Ankle edema 11/25/2017  ? Trochanteric bursitis of left hip 11/25/2017  ? Cough 08/24/2016  ? Wheezing 08/24/2016  ? Inflammatory arthritis 07/13/2016  ? Primary osteoarthritis of both hands 07/13/2016  ? Primary osteoarthritis of both feet 07/13/2016  ? Spondylosis of lumbar region  without myelopathy or radiculopathy 07/13/2016  ? Trochanteric bursitis of both hips 07/13/2016  ? History of hepatitis B 07/13/2016  ? Acute bronchitis 07/06/2016  ? Constipation 07/12/2015  ? Vitamin D deficiency 07/12/2015  ? Pain of both heels 11/03/2013  ? Arthritis pain, hand 11/17/2012  ? Mouth sores 11/17/2012  ? Blood pressure elevated without history of HTN 07/07/2012  ? Palpitations 04/07/2012  ? Patellar tendonitis, right 11/07/2011  ? Well adult exam 03/05/2011  ? Hx of adenomatous polyp of colon 11/03/2010  ? ABDOMINAL PAIN, GENERALIZED 06/13/2010  ? HEMORRHOIDS 05/22/2010  ? RECTAL PAIN 05/22/2010  ? HEMATOCHEZIA 05/22/2010  ? HYPOGLYCEMIA 12/01/2009  ? HYPOGONADISM 12/01/2009  ? LOW BACK PAIN, ACUTE 12/01/2009  ? HERPES SIMPLEX WITHOUT MENTION OF COMPLICATION 56/38/9373  ? PANIC DISORDER 11/24/2009  ?  DEPRESSIVE DISORDER 11/24/2009  ? GERD 09/13/2009  ? PES PLANUS 03/16/2009  ? REACTIVE AIRWAY DISEASE 03/09/2009  ? SNORING 03/09/2009  ? Anxiety disorder 02/24/2009  ? HYPOTHYROIDISM 10/25/2008  ? BACK PAIN 08/23/2008  ? Pain in joint,

## 2021-10-16 ENCOUNTER — Encounter: Payer: Self-pay | Admitting: Physical Medicine and Rehabilitation

## 2021-10-16 ENCOUNTER — Ambulatory Visit: Payer: Self-pay

## 2021-10-16 ENCOUNTER — Ambulatory Visit (INDEPENDENT_AMBULATORY_CARE_PROVIDER_SITE_OTHER): Payer: 59 | Admitting: Physical Medicine and Rehabilitation

## 2021-10-16 VITALS — BP 110/69 | HR 78

## 2021-10-16 DIAGNOSIS — M5416 Radiculopathy, lumbar region: Secondary | ICD-10-CM | POA: Diagnosis not present

## 2021-10-16 MED ORDER — METHYLPREDNISOLONE ACETATE 80 MG/ML IJ SUSP
80.0000 mg | Freq: Once | INTRAMUSCULAR | Status: AC
  Administered 2021-10-16: 80 mg

## 2021-10-16 NOTE — Progress Notes (Signed)
Pt state lower back pain that travels to his left hip and groin. Pt state bending, standing and laying makes the pain worse. Pt state he takes over the counter pain meds and uses ice to help ease his pain. ? ?Numeric Pain Rating Scale and Functional Assessment ?Average Pain 4 ? ? ?In the last MONTH (on 0-10 scale) has pain interfered with the following? ? ?1. General activity like being  able to carry out your everyday physical activities such as walking, climbing stairs, carrying groceries, or moving a chair?  ?Rating(9) ? ? ?+Driver, -BT, -Dye Allergies. ? ?

## 2021-10-16 NOTE — Patient Instructions (Signed)

## 2021-10-17 ENCOUNTER — Encounter: Payer: Self-pay | Admitting: Internal Medicine

## 2021-10-26 NOTE — Progress Notes (Signed)
? ?Glen [name] - 56 y.o. male MRN [id number]  Date of birth: [birth year] ? ?Office Visit Note: ?Visit Date: 10/16/2021 ?PCP: Plotnikov, Evie Lacks, MD ?Referred by: Lorine Bears, NP ? ?Subjective: ?Chief Complaint  ?Patient presents with  ? Lower Back - Pain  ? Left Hip - Pain  ? ?HPI:  Glen [name] is a 56 y.o. male who comes in today at the request of Barnet Pall, FNP for planned Left L3-4 Lumbar Interlaminar epidural steroid injection with fluoroscopic guidance.  The patient has failed conservative care including home exercise, medications, time and activity modification.  This injection will be diagnostic and hopefully therapeutic.  Please see requesting physician notes for further details and justification. ? ?ROS Otherwise per HPI. ? ?Assessment & Plan: ?Visit Diagnoses:  ?  ICD-10-CM   ?1. Lumbar radiculopathy  M54.16 XR C-ARM NO REPORT  ?  Epidural Steroid injection  ?  methylPREDNISolone acetate (DEPO-MEDROL) injection 80 mg  ?  ?  ?Plan: No additional findings.  ? ?Meds & Orders:  ?Meds ordered this encounter  ?Medications  ? methylPREDNISolone acetate (DEPO-MEDROL) injection 80 mg  ?  ?Orders Placed This Encounter  ?Procedures  ? XR C-ARM NO REPORT  ? Epidural Steroid injection  ?  ?Follow-up: Return if symptoms worsen or fail to improve.  ? ?Procedures: ?No procedures performed  ?Lumbar Epidural Steroid Injection - Interlaminar Approach with Fluoroscopic Guidance ? ?Patient: Glen [name]      ?Date of Birth: 11-Mar-1966 ?MRN: [id number] ?PCP: Plotnikov, Evie Lacks, MD      ?Visit Date: 10/16/2021 ?  ?Universal Protocol:    ? ?Consent Given By: the patient ? ?Position: PRONE ? ?Additional Comments: ?Vital signs were monitored before and after the procedure. ?Patient was prepped and draped in the usual sterile fashion. ?The correct patient, procedure, and site was verified. ? ? ?Injection Procedure Details:  ? ?Procedure diagnoses: Lumbar radiculopathy [M54.16]  ? ?Meds Administered:  ?Meds ordered  this encounter  ?Medications  ? methylPREDNISolone acetate (DEPO-MEDROL) injection 80 mg  ?  ? ?Laterality: Left ? ?Location/Site:  L3-4 ? ?Needle: 3.5 in., 20 ga. Tuohy ? ?Needle Placement: Paramedian epidural ? ?Findings:  ? -Comments: Excellent flow of contrast into the epidural space. ? ?Procedure Details: ?Using a paramedian approach from the side mentioned above, the region overlying the inferior lamina was localized under fluoroscopic visualization and the soft tissues overlying this structure were infiltrated with 4 ml. of 1% Lidocaine without Epinephrine. The Tuohy needle was inserted into the epidural space using a paramedian approach.  ? ?The epidural space was localized using loss of resistance along with counter oblique bi-planar fluoroscopic views.  After negative aspirate for air, blood, and CSF, a 2 ml. volume of Isovue-250 was injected into the epidural space and the flow of contrast was observed. Radiographs were obtained for documentation purposes.   ? ?The injectate was administered into the level noted above. ? ? ?Additional Comments:  ?No complications occurred ?Dressing: 2 x 2 sterile gauze and Band-Aid ?  ? ?Post-procedure details: ?Patient was observed during the procedure. ?Post-procedure instructions were reviewed. ? ?Patient left the clinic in stable condition.   ? ?Clinical History: ?Lumbar MRI 05/21/2016 ? ?Impression: ?1. Small left central L5-S1 disc protrusion is mildly progressed without spinal canal stenosis or contact of descending nerve roots.  ?2. Small right central L3-L4 disc protrusion superimposed upon minimal bulging disc-osteophyte complex without spinal canal stenosis or subarticular zone narrowing, unchanged. ?3. Stable to minimally progressed small right  central-subarticular T12-L1 disc protrusion mildly narrows right subarticular zone contacting descending right L1 nerve roots without displacement or spinal canal stenosis.  ? ? ? ?Objective:  VS:  HT:    WT:   BMI:      BP:110/69  HR:78bpm  TEMP: ( )  RESP:  ?Physical Exam ?Vitals and nursing note reviewed.  ?Constitutional:   ?   General: He is not in acute distress. ?   Appearance: Normal appearance. He is not ill-appearing.  ?HENT:  ?   Head: Normocephalic and atraumatic.  ?   Right Ear: External ear normal.  ?   Left Ear: External ear normal.  ?   Nose: No congestion.  ?Eyes:  ?   Extraocular Movements: Extraocular movements intact.  ?Cardiovascular:  ?   Rate and Rhythm: Normal rate.  ?   Pulses: Normal pulses.  ?Pulmonary:  ?   Effort: Pulmonary effort is normal. No respiratory distress.  ?Abdominal:  ?   General: There is no distension.  ?   Palpations: Abdomen is soft.  ?Musculoskeletal:     ?   General: No tenderness or signs of injury.  ?   Cervical back: Neck supple.  ?   Right lower leg: No edema.  ?   Left lower leg: No edema.  ?   Comments: Patient has good distal strength without clonus.  ?Skin: ?   Findings: No erythema or rash.  ?Neurological:  ?   General: No focal deficit present.  ?   Mental Status: He is alert and oriented to person, place, and time.  ?   Sensory: No sensory deficit.  ?   Motor: No weakness or abnormal muscle tone.  ?   Coordination: Coordination normal.  ?Psychiatric:     ?   Mood and Affect: Mood normal.     ?   Behavior: Behavior normal.  ?  ? ?Imaging: ?No results found. ?

## 2021-10-26 NOTE — Procedures (Signed)
Lumbar Epidural Steroid Injection - Interlaminar Approach with Fluoroscopic Guidance ? ?Patient: Glen [name]      ?Date of Birth: July 05, 1965 ?MRN: [id number] ?PCP: Plotnikov, Evie Lacks, MD      ?Visit Date: 10/16/2021 ?  ?Universal Protocol:    ? ?Consent Given By: the patient ? ?Position: PRONE ? ?Additional Comments: ?Vital signs were monitored before and after the procedure. ?Patient was prepped and draped in the usual sterile fashion. ?The correct patient, procedure, and site was verified. ? ? ?Injection Procedure Details:  ? ?Procedure diagnoses: Lumbar radiculopathy [M54.16]  ? ?Meds Administered:  ?Meds ordered this encounter  ?Medications  ? methylPREDNISolone acetate (DEPO-MEDROL) injection 80 mg  ?  ? ?Laterality: Left ? ?Location/Site:  L3-4 ? ?Needle: 3.5 in., 20 ga. Tuohy ? ?Needle Placement: Paramedian epidural ? ?Findings:  ? -Comments: Excellent flow of contrast into the epidural space. ? ?Procedure Details: ?Using a paramedian approach from the side mentioned above, the region overlying the inferior lamina was localized under fluoroscopic visualization and the soft tissues overlying this structure were infiltrated with 4 ml. of 1% Lidocaine without Epinephrine. The Tuohy needle was inserted into the epidural space using a paramedian approach.  ? ?The epidural space was localized using loss of resistance along with counter oblique bi-planar fluoroscopic views.  After negative aspirate for air, blood, and CSF, a 2 ml. volume of Isovue-250 was injected into the epidural space and the flow of contrast was observed. Radiographs were obtained for documentation purposes.   ? ?The injectate was administered into the level noted above. ? ? ?Additional Comments:  ?No complications occurred ?Dressing: 2 x 2 sterile gauze and Band-Aid ?  ? ?Post-procedure details: ?Patient was observed during the procedure. ?Post-procedure instructions were reviewed. ? ?Patient left the clinic in stable condition.  ?

## 2021-11-02 ENCOUNTER — Encounter (HOSPITAL_BASED_OUTPATIENT_CLINIC_OR_DEPARTMENT_OTHER): Payer: Self-pay | Admitting: Surgery

## 2021-11-02 ENCOUNTER — Other Ambulatory Visit: Payer: Self-pay

## 2021-11-06 ENCOUNTER — Ambulatory Visit (INDEPENDENT_AMBULATORY_CARE_PROVIDER_SITE_OTHER): Payer: 59 | Admitting: Family Medicine

## 2021-11-06 ENCOUNTER — Encounter (HOSPITAL_BASED_OUTPATIENT_CLINIC_OR_DEPARTMENT_OTHER)
Admission: RE | Admit: 2021-11-06 | Discharge: 2021-11-06 | Disposition: A | Discharge status: Home / Self Care | Payer: 59 | Source: Ambulatory Visit | Attending: Surgery | Admitting: Surgery

## 2021-11-06 DIAGNOSIS — M25552 Pain in left hip: Secondary | ICD-10-CM

## 2021-11-06 DIAGNOSIS — M7702 Medial epicondylitis, left elbow: Secondary | ICD-10-CM | POA: Diagnosis not present

## 2021-11-06 DIAGNOSIS — Z01812 Encounter for preprocedural laboratory examination: Secondary | ICD-10-CM | POA: Diagnosis present

## 2021-11-06 NOTE — Patient Instructions (Signed)
Do calf strengthening with green theraband, work up hopefully to calf raises on level ground.  You have gluteal tendinopathy of your left hip. Do home exercises most days of the week. Consider aleve 2 tabs twice a day or topical voltaren gel up to 4 times a day. Consider nitro patches but these put you at risk of headaches. Consider physical therapy.  You have medial epicondylitis (golfer's elbow). Avoid painful activities as much as possible (unless doing home exercises). Tylenol or aleve as needed for pain. Strengthening with 1 pound weight pronation/supination, wrist flexion, stretching exercises (hold stretches 20-30 seconds and repeat 3 times, exercises 3 sets of 10 of each). Sleeve or counterforce brace may be helpful to unload area of pain while it heals. Follow up with me in 6 weeks.

## 2021-11-06 NOTE — Progress Notes (Signed)

## 2021-11-06 NOTE — Progress Notes (Unsigned)
Patient was instructed in 10 minutes of therapeutic exercises for left hip pain, left elbow pain and left calf weakness to improve strength, ROM and function according to my instructions and plan of care by a Certified Athletic Trainer during the office visit.  Proper technique shown and discussed, handout provided.  All questions discussed and answered.

## 2021-11-07 ENCOUNTER — Ambulatory Visit (INDEPENDENT_AMBULATORY_CARE_PROVIDER_SITE_OTHER): Payer: 59 | Admitting: Internal Medicine

## 2021-11-07 ENCOUNTER — Encounter: Payer: Self-pay | Admitting: Family Medicine

## 2021-11-07 ENCOUNTER — Encounter: Payer: Self-pay | Admitting: Internal Medicine

## 2021-11-07 VITALS — BP 132/80 | HR 66 | Temp 98.7°F | Ht 73.0 in | Wt 220.8 lb

## 2021-11-07 DIAGNOSIS — F410 Panic disorder [episodic paroxysmal anxiety] without agoraphobia: Secondary | ICD-10-CM

## 2021-11-07 DIAGNOSIS — R635 Abnormal weight gain: Secondary | ICD-10-CM | POA: Diagnosis not present

## 2021-11-07 DIAGNOSIS — R7303 Prediabetes: Secondary | ICD-10-CM | POA: Diagnosis not present

## 2021-11-07 DIAGNOSIS — E669 Obesity, unspecified: Secondary | ICD-10-CM | POA: Insufficient documentation

## 2021-11-07 DIAGNOSIS — Z6830 Body mass index (BMI) 30.0-30.9, adult: Secondary | ICD-10-CM

## 2021-11-07 DIAGNOSIS — E6609 Other obesity due to excess calories: Secondary | ICD-10-CM

## 2021-11-07 DIAGNOSIS — R6889 Other general symptoms and signs: Secondary | ICD-10-CM | POA: Insufficient documentation

## 2021-11-07 DIAGNOSIS — K429 Umbilical hernia without obstruction or gangrene: Secondary | ICD-10-CM

## 2021-11-07 LAB — COMPREHENSIVE METABOLIC PANEL
ALT: 26 U/L (ref 0–53)
AST: 27 U/L (ref 0–37)
Albumin: 4.3 g/dL (ref 3.5–5.2)
Alkaline Phosphatase: 50 U/L (ref 39–117)
BUN: 13 mg/dL (ref 6–23)
CO2: 31 mEq/L (ref 19–32)
Calcium: 9.7 mg/dL (ref 8.4–10.5)
Chloride: 104 mEq/L (ref 96–112)
Creatinine, Ser: 0.92 mg/dL (ref 0.40–1.50)
GFR: 92.86 mL/min (ref >60.00)
Glucose, Bld: 88 mg/dL (ref 70–99)
Potassium: 4.2 mEq/L (ref 3.5–5.1)
Sodium: 140 mEq/L (ref 135–145)
Total Bilirubin: 0.5 mg/dL (ref 0.2–1.2)
Total Protein: 6.9 g/dL (ref 6.0–8.3)

## 2021-11-07 LAB — HEMOGLOBIN A1C: Hgb A1c MFr Bld: 5.3 % (ref 4.6–6.5)

## 2021-11-07 LAB — TSH: TSH: 1.52 u[IU]/mL (ref 0.35–5.50)

## 2021-11-07 MED ORDER — PHENTERMINE HCL 37.5 MG PO TABS: 37.5000 mg | ORAL_TABLET | Freq: Every day | ORAL | 2 refills | Status: DC

## 2021-11-07 MED ORDER — CITALOPRAM HYDROBROMIDE 20 MG PO TABS: ORAL_TABLET | ORAL | 5 refills | Status: DC

## 2021-11-07 MED ORDER — VICTOZA 18 MG/3ML ~~LOC~~ SOPN: PEN_INJECTOR | SUBCUTANEOUS | 3 refills | Status: DC

## 2021-11-07 MED ORDER — ALPRAZOLAM 0.25 MG PO TABS: 0.2500 mg | ORAL_TABLET | Freq: Two times a day (BID) | ORAL | 1 refills | Status: DC | PRN

## 2021-11-07 NOTE — Assessment & Plan Note (Addendum)
BMI 29  Diet discussed Start Phentermine  Potential benefits of a short/long term phentermine use as well as potential risks  and complications were explained to the patient and were aknowledged.

## 2021-11-07 NOTE — Progress Notes (Signed)
Subjective:  Patient ID: Glen [name], male    DOB: 1965-08-04  Age: 56 y.o. MRN: [id number]  CC: Weight Loss   HPI Glen [name] presents for wt gain - 30 lbs, obesity and hyperglycemia/pre-DM C/o hernia  Outpatient Medications Prior to Visit  Medication Sig Dispense Refill   Ginger, Zingiber officinalis, (GINGER PO) Take 1 tablet by mouth daily.     omeprazole (PRILOSEC) 40 MG capsule Take 1 capsule (40 mg total) by mouth daily. 90 capsule 3   Turmeric (QC TUMERIC COMPLEX PO) Take 1 tablet by mouth daily.     valACYclovir (VALTREX) 500 MG tablet TAKE 1 TABLET BY MOUTH TWICE DAILY AS NEEDED FOR FEVER BLISTERS FOR 5 DAYS (Patient taking differently: Take 500 mg by mouth daily as needed (fever blisters).) 30 tablet 0   valsartan (DIOVAN) 80 MG tablet Take 1 tablet by mouth once daily 30 tablet 5   ZOMIG 5 MG nasal solution USE ONE DOSE IN NOSTRIL AS NEEDED FOR MIGRAINE (Patient taking differently: Place 1 spray into the nose as needed for migraine.) 6 Units 3   ALPRAZolam (XANAX) 0.25 MG tablet Take 1 tablet (0.25 mg total) by mouth 2 (two) times daily as needed for anxiety (Panic attacks). 60 tablet 3   citalopram (CELEXA) 20 MG tablet Take 0.5 tablets (10 mg total) by mouth daily. 30 tablet 0   diclofenac Sodium (VOLTAREN) 1 % GEL Apply 2 g topically 4 (four) times daily.     LUMIFY 0.025 % SOLN SMARTSIG:1 Drop(s) In Eye(s) PRN     No facility-administered medications prior to visit.    ROS: Review of Systems  Constitutional:  Positive for unexpected weight change. Negative for appetite change and fatigue.  HENT:  Negative for congestion, nosebleeds, sneezing, sore throat and trouble swallowing.   Eyes:  Negative for itching and visual disturbance.  Respiratory:  Negative for cough.   Cardiovascular:  Negative for chest pain, palpitations and leg swelling.  Gastrointestinal:  Negative for abdominal distention, blood in stool, diarrhea, nausea and vomiting.  Genitourinary:   Negative for frequency and hematuria.  Musculoskeletal:  Negative for back pain, gait problem, joint swelling and neck pain.  Skin:  Negative for rash.  Neurological:  Negative for dizziness, tremors, speech difficulty and weakness.  Psychiatric/Behavioral:  Negative for agitation, dysphoric mood and sleep disturbance. The patient is not nervous/anxious.    Objective:  BP 132/80 (BP Location: Left Arm, Patient Position: Sitting, Cuff Size: Normal)   Pulse 66   Temp 98.7 F (37.1 C) (Oral)   Ht '6\' 1"'$  (1.854 m)   Wt 220 lb 12.8 oz (100.2 kg)   SpO2 99%   BMI 29.13 kg/m   BP Readings from Last 3 Encounters:  11/07/21 132/80  11/06/21 120/80  10/16/21 110/69    Wt Readings from Last 3 Encounters:  11/07/21 220 lb 12.8 oz (100.2 kg)  11/06/21 215 lb (97.5 kg)  04/20/21 219 lb (99.3 kg)    Physical Exam Constitutional:      General: He is not in acute distress.    Appearance: He is well-developed. He is obese.     Comments: NAD  Eyes:     Conjunctiva/sclera: Conjunctivae normal.     Pupils: Pupils are equal, round, and reactive to light.  Neck:     Thyroid: No thyromegaly.     Vascular: No JVD.  Cardiovascular:     Rate and Rhythm: Normal rate and regular rhythm.     Heart sounds: Normal  heart sounds. No murmur heard.   No friction rub. No gallop.  Pulmonary:     Effort: Pulmonary effort is normal. No respiratory distress.     Breath sounds: Normal breath sounds. No wheezing or rales.  Chest:     Chest wall: No tenderness.  Abdominal:     General: Bowel sounds are normal. There is no distension.     Palpations: Abdomen is soft. There is no mass.     Tenderness: There is no abdominal tenderness. There is no guarding or rebound.     Hernia: A hernia is present.  Musculoskeletal:        General: No tenderness. Normal range of motion.     Cervical back: Normal range of motion.  Lymphadenopathy:     Cervical: No cervical adenopathy.  Skin:    General: Skin is warm and  dry.     Findings: No rash.  Neurological:     Mental Status: He is alert and oriented to person, place, and time.     Cranial Nerves: No cranial nerve deficit.     Motor: No abnormal muscle tone.     Coordination: Coordination normal.     Gait: Gait normal.     Deep Tendon Reflexes: Reflexes are normal and symmetric.  Psychiatric:        Behavior: Behavior normal.        Thought Content: Thought content normal.        Judgment: Judgment normal.  Umb hernia 4 cm  Lab Results  Component Value Date   WBC 5.1 08/31/2020   HGB 15.1 08/31/2020   HCT 43.9 08/31/2020   PLT 247 08/31/2020   GLUCOSE 94 01/30/2021   CHOL 198 08/08/2020   TRIG 66.0 08/08/2020   HDL 53.40 08/08/2020   LDLDIRECT 150.6 06/29/2013   LDLCALC 132 (H) 08/08/2020   ALT 21 01/30/2021   AST 26 01/30/2021   NA 139 01/30/2021   K 3.6 01/30/2021   CL 102 01/30/2021   CREATININE 1.06 01/30/2021   BUN 14 01/30/2021   CO2 30 01/30/2021   TSH 1.11 08/08/2020   PSA 1.08 08/08/2020   HGBA1C 5.3 01/30/2021    No results found.  Assessment & Plan:   Problem List Items Addressed This Visit     PANIC DISORDER    Xanax prn  Potential benefits of a long term benzodiazepines  use as well as potential risks  and complications were explained to the patient and were aknowledged.         Relevant Medications   citalopram (CELEXA) 20 MG tablet   ALPRAZolam (XANAX) 0.25 MG tablet   Umbilical hernia    Surgery - pending 11/09/21 New meds - start 4 wks after surgery       Weight gain - Primary    Check TSH       Relevant Orders   TSH   Prediabetes    Will try Victoza       Relevant Orders   Comprehensive metabolic panel   Hemoglobin A1c   Obesity    BMI 29  Diet discussed Start Phentermine  Potential benefits of a short/long term phentermine use as well as potential risks  and complications were explained to the patient and were aknowledged.        Relevant Medications   phentermine  (ADIPEX-P) 37.5 MG tablet   liraglutide (VICTOZA) 18 MG/3ML SOPN      Meds ordered this encounter  Medications   phentermine (ADIPEX-P) 37.5 MG  tablet    Sig: Take 1 tablet (37.5 mg total) by mouth daily before breakfast.    Dispense:  30 tablet    Refill:  2   citalopram (CELEXA) 20 MG tablet    Sig: Take 0.5 tablets (10 mg total) by mouth daily.    Dispense:  30 tablet    Refill:  5    Please schedule office visit for more refills.   ALPRAZolam (XANAX) 0.25 MG tablet    Sig: Take 1 tablet (0.25 mg total) by mouth 2 (two) times daily as needed for anxiety (Panic attacks).    Dispense:  60 tablet    Refill:  1   liraglutide (VICTOZA) 18 MG/3ML SOPN    Sig: Start 0.'6mg'$  SQ once a day for 7 days, then increase to 1.'2mg'$  once a day    Dispense:  6 mL    Refill:  3      Follow-up: Return in about 3 months (around 02/07/2022) for a follow-up visit.  Walker Kehr, MD

## 2021-11-07 NOTE — Assessment & Plan Note (Signed)
Check TSH 

## 2021-11-07 NOTE — Assessment & Plan Note (Signed)
Surgery - pending 11/09/21 New meds - start 4 wks after surgery

## 2021-11-07 NOTE — Progress Notes (Signed)
PCP: Plotnikov, Evie Lacks, MD  Subjective:   HPI: Patient is a 56 y.o. male here for left elbow and hip pain.  Patient reports he's had left medial elbow pain for about 2 months. No injury or trauma. Worse with compression on the medial elbow. No numbness or tingling, radiation. Pain also noted in left hip for past 8 months laterally. Cannot lie down on left hip due to pain. Sometimes radiates down back of left leg. No back pain. No numbness or tingling.  Past Medical History:  Diagnosis Date   Allergic rhinitis    Anxiety    Arthritis    Asthmatic bronchitis    Barrett esophagus    Dry eyes    GERD (gastroesophageal reflux disease)    Headache    "prone to migraines"   Hepatitis B infection 2008   recovered   Hx of adenomatous polyp of colon 2012   Hypertension    Hypothyroidism 2010   Dr. Wende Bushy, been tested since and this is an inacurrate diagnosis   Internal hemorrhoids    PONV (postoperative nausea and vomiting)    Ulcerative proctitis ([place])    Urethritis 2010   Vitamin D deficiency     Current Outpatient Medications on File Prior to Visit  Medication Sig Dispense Refill   ALPRAZolam (XANAX) 0.25 MG tablet Take 1 tablet (0.25 mg total) by mouth 2 (two) times daily as needed for anxiety (Panic attacks). 60 tablet 3   citalopram (CELEXA) 20 MG tablet Take 0.5 tablets (10 mg total) by mouth daily. 30 tablet 0   Ginger, Zingiber officinalis, (GINGER PO) Take 1 tablet by mouth daily.     omeprazole (PRILOSEC) 40 MG capsule Take 1 capsule (40 mg total) by mouth daily. 90 capsule 3   Turmeric (QC TUMERIC COMPLEX PO) Take 1 tablet by mouth daily.     valACYclovir (VALTREX) 500 MG tablet TAKE 1 TABLET BY MOUTH TWICE DAILY AS NEEDED FOR FEVER BLISTERS FOR 5 DAYS (Patient taking differently: Take 500 mg by mouth daily as needed (fever blisters).) 30 tablet 0   valsartan (DIOVAN) 80 MG tablet Take 1 tablet by mouth once daily 30 tablet 5   ZOMIG 5 MG nasal solution USE  ONE DOSE IN NOSTRIL AS NEEDED FOR MIGRAINE (Patient taking differently: Place 1 spray into the nose as needed for migraine.) 6 Units 3   No current facility-administered medications on file prior to visit.    Past Surgical History:  Procedure Laterality Date   ANTERIOR CERVICAL DECOMP/DISCECTOMY FUSION N/A 08/31/2020   Procedure: ANTERIOR CERVICAL DECOMPRESSION/DISCECTOMY FUSION, INTERBODY PROSTHESIS, PLATE/SCREWS CERVICAL FIVE-SIX, CERVICAL SIX-SEVEN;  Surgeon: Newman Pies, MD;  Location: [facility];  Service: Neurosurgery;  Laterality: N/A;  ANTERIOR CERVICAL DECOMPRESSION/DISCECTOMY FUSION, INTERBODY PROSTHESIS, PLATE/SCREWS CERVICAL FIVE-SIX, CERVICAL SIX-SEVEN   COLONOSCOPY     multiple - adenoma 2012   ESOPHAGOGASTRODUODENOSCOPY     multiple - barrett's   FACIAL COSMETIC SURGERY     HEMORRHOID BANDING     [place]   LIPOSUCTION     TEAR DUCT PROBING  2009    Allergies  Allergen Reactions   Codeine Other (See Comments)    "Makes me really sick, I got hallucinations when the dose was really high" 08/13/20 Pt also states he is "very" sensitive to strong pain medications   Mounjaro [Tirzepatide]     Dizziness and nausea   Penicillins Other (See Comments)    "I don't remember, but mom said don't take" 08/13/20    BP 120/80  Ht '6\' 1"'$  (1.854 m)   Wt 215 lb (97.5 kg)   BMI 28.37 kg/m       View : No data to display.              View : No data to display.              Objective:  Physical Exam:  Gen: NAD, comfortable in exam room  Left elbow: No deformity. FROM with 5/5 strength including wrist flexion, finger flexion, pronation without pain. Tenderness to palpation medial epicondyle.  No other tenderness. NVI distally. Negative tinels cubital tunnel.  No ulnar nerve subluxation.  Left hip: No deformity. FROM with 5/5 strength though mild pain with hip abduction. Tenderness to palpation posterior to greater trochanter.  No tenderness on trochanter or  other tenderness about the hip. NVI distally. Negative logroll Negative faber, fadir, and piriformis stretches.   Assessment & Plan:  1. Left elbow pain - 2/2 medial epicondylitis, mild.  No pain with resisted motions, only with mild tenderness.  Home exercises reviewed. Tylenol and/or aleve.  Counterforce brace.  F/u in 6 weeks.  2. Left hip pain - 2/2 gluteal tendinopathy.  Home exercises reviewed.  Aleve or voltaren gel.  Consider nitro patches, physical therapy.

## 2021-11-07 NOTE — Assessment & Plan Note (Signed)
Xanax prn  Potential benefits of a long term benzodiazepines  use as well as potential risks  and complications were explained to the patient and were aknowledged. 

## 2021-11-07 NOTE — Assessment & Plan Note (Addendum)
Will try Victoza

## 2021-11-08 ENCOUNTER — Encounter: Payer: Self-pay | Admitting: Internal Medicine

## 2021-11-08 NOTE — H&P (Signed)
REFERRING PHYSICIAN:  PROVIDER: Beverlee Nims, MD  MRN: [id number] DOB: 08/15/1965  Subjective   Chief Complaint: New Patient (Abdominal hernia )   History of Present Illness: Glen [name] is a 56 y.o. male who is seen today as an office consultation for evaluation of New Patient (Abdominal hernia ) .   This is a pleasant gentleman who is here for evaluation of umbilical hernia. He reports he has had a bulge above his umbilicus for many years but it is now becoming uncomfortable especially with vigorous activity. He will feel a sharp pulling discomfort which is moderate in intensity. He reports that it always easily reduces. He has had no obstructive symptoms and denies nausea or vomiting. He is otherwise without complaints  Review of Systems: A complete review of systems was obtained from the patient. I have reviewed this information and discussed as appropriate with the patient. See HPI as well for other ROS.  ROS   Medical History: Past Medical History:  Diagnosis Date   Anxiety   Arthritis   GERD (gastroesophageal reflux disease)   There is no problem list on file for this patient.  History reviewed. No pertinent surgical history.   Allergies  Allergen Reactions   Codeine Nausea And Vomiting, Nausea and Other (See Comments)  "Makes me really sick, I got hallucinations when the dose was really high" 08/13/20 Pt also states he is "very" sensitive to strong pain medications   Penicillins Itching, Other (See Comments) and Unknown  "I don't remember, but mom said don't take" 08/13/20   Current Outpatient Medications on File Prior to Visit  Medication Sig Dispense Refill   citalopram (CELEXA) 10 MG tablet citalopram 10 mg tablet   omeprazole (PRILOSEC) 40 MG DR capsule omeprazole 40 mg capsule,delayed release   valsartan (DIOVAN) 80 MG tablet Take 1 tablet by mouth once daily   No current facility-administered medications on file prior to visit.   Family  History  Problem Relation Age of Onset   Skin cancer Mother   Obesity Mother   High blood pressure (Hypertension) Mother   Hyperlipidemia (Elevated cholesterol) Mother   Deep vein thrombosis (DVT or abnormal blood clot formation) Mother   Diabetes Mother    Social History   Tobacco Use  Smoking Status Former   Types: Cigarettes   Quit date: 2003   Years since quitting: 20.2  Smokeless Tobacco Never    Social History   Socioeconomic History   Marital status: Married  Tobacco Use   Smoking status: Former  Types: Cigarettes  Quit date: 2003  Years since quitting: 20.2   Smokeless tobacco: Never  Substance and Sexual Activity   Alcohol use: Yes  Comment: 1 - 2 drinks a day   Drug use: Never   Objective:   Vitals:   BP: 132/80  Pulse: 74  Temp: 36.7 C (98.1 F)  SpO2: 99%  Weight: 99.3 kg (219 lb)  Height: 188 cm ('6\' 2"'$ )   Body mass index is 28.12 kg/m.  Physical Exam   He appears well on exam  His abdomen is soft. He has moderate rectus diastases. About 1 inch above the umbilicus he does have a reducible hernia with a 2 cm fascial defect.  Labs, Imaging and Diagnostic Testing: I reviewed notes in the electronic medical records  Assessment and Plan:   Diagnoses and all orders for this visit:  Umbilical hernia without obstruction and without gangrene  Rectus diastasis    I had a discussion with  the patient regarding abdominal wall anatomy. We discussed the diagnosis of hernias and rectus diastases. I explained to him that rectus diastases not a true hernia defect and is not repaired by general surgeons. The hernia measuring approximate 2 cm just above the umbilicus, however, does need surgical repair as it is symptomatic. We discussed hernia repairs in detail. We discussed use of mesh with hernia surgery as well as the laparoscopic and open techniques. After discussion, he wishes to proceed with an open repair of the hernia with mesh as an outpatient. I  again explained the surgical procedure in detail. We discussed the risk which includes but is not limited to bleeding, infection, injury to surrounding structures, the use of mesh, hernia recurrence, postoperative recovery, etc. He understands and wishes to proceed with surgery which will be scheduled. We did discuss that rectus diastases is repaired by plastic surgeons if he should so desire

## 2021-11-08 NOTE — Telephone Encounter (Addendum)
Rec'd PA for pt Phentermine 37.5 mg completed on cover-my-meds w/ (Key: [id number]). Rec;d msg Your information has been sent to [employer].../Rec'd msg stating [employer] closed PA bcz product not covered by this plan. Prior Authorization not available., Please have pt to contact plan to see what insurance cover...Chryl Heck

## 2021-11-08 NOTE — Telephone Encounter (Signed)
Rec'd PA for Victoza '18mg'$ /46m. Completed PA on cover-my-meds w/  (Key: [id number]). It states your PA been submitted to [facility]. Will check tomorrow..Marland KitchenJohny Chess

## 2021-11-09 ENCOUNTER — Encounter (HOSPITAL_BASED_OUTPATIENT_CLINIC_OR_DEPARTMENT_OTHER)
Admission: RE | Disposition: A | Discharge status: Home / Self Care | Payer: Self-pay | Source: Ambulatory Visit | Attending: Surgery

## 2021-11-09 ENCOUNTER — Other Ambulatory Visit: Payer: Self-pay

## 2021-11-09 ENCOUNTER — Ambulatory Visit (HOSPITAL_BASED_OUTPATIENT_CLINIC_OR_DEPARTMENT_OTHER)
Admission: RE | Admit: 2021-11-09 | Discharge: 2021-11-09 | Disposition: A | Discharge status: Home / Self Care | Payer: 59 | Source: Ambulatory Visit | Attending: Surgery | Admitting: Surgery

## 2021-11-09 ENCOUNTER — Encounter (HOSPITAL_BASED_OUTPATIENT_CLINIC_OR_DEPARTMENT_OTHER): Payer: Self-pay | Admitting: Surgery

## 2021-11-09 ENCOUNTER — Ambulatory Visit (HOSPITAL_BASED_OUTPATIENT_CLINIC_OR_DEPARTMENT_OTHER): Payer: 59 | Admitting: Anesthesiology

## 2021-11-09 DIAGNOSIS — K219 Gastro-esophageal reflux disease without esophagitis: Secondary | ICD-10-CM | POA: Insufficient documentation

## 2021-11-09 DIAGNOSIS — F419 Anxiety disorder, unspecified: Secondary | ICD-10-CM | POA: Diagnosis not present

## 2021-11-09 DIAGNOSIS — Z79899 Other long term (current) drug therapy: Secondary | ICD-10-CM | POA: Insufficient documentation

## 2021-11-09 DIAGNOSIS — M6208 Separation of muscle (nontraumatic), other site: Secondary | ICD-10-CM | POA: Diagnosis not present

## 2021-11-09 DIAGNOSIS — Z87891 Personal history of nicotine dependence: Secondary | ICD-10-CM | POA: Diagnosis not present

## 2021-11-09 DIAGNOSIS — I10 Essential (primary) hypertension: Secondary | ICD-10-CM | POA: Insufficient documentation

## 2021-11-09 DIAGNOSIS — K429 Umbilical hernia without obstruction or gangrene: Secondary | ICD-10-CM | POA: Diagnosis present

## 2021-11-09 HISTORY — DX: Essential (primary) hypertension: I10

## 2021-11-09 HISTORY — DX: Methicillin resistant Staphylococcus aureus infection, unspecified site: A49.02

## 2021-11-09 HISTORY — PX: UMBILICAL HERNIA REPAIR: SHX196

## 2021-11-09 SURGERY — REPAIR, HERNIA, UMBILICAL, ADULT
Anesthesia: General | Site: Abdomen

## 2021-11-09 MED ORDER — SCOPOLAMINE 1 MG/3DAYS TD PT72
MEDICATED_PATCH | TRANSDERMAL | Status: AC
  Filled 2021-11-09: qty 1

## 2021-11-09 MED ORDER — KETOROLAC TROMETHAMINE 30 MG/ML IJ SOLN
INTRAMUSCULAR | Status: DC | PRN
  Administered 2021-11-09: 30 mg via INTRAVENOUS

## 2021-11-09 MED ORDER — MIDAZOLAM HCL 5 MG/5ML IJ SOLN
INTRAMUSCULAR | Status: DC | PRN
  Administered 2021-11-09: 2 mg via INTRAVENOUS

## 2021-11-09 MED ORDER — CIPROFLOXACIN IN D5W 400 MG/200ML IV SOLN
400.0000 mg | INTRAVENOUS | Status: AC
  Administered 2021-11-09: 400 mg via INTRAVENOUS

## 2021-11-09 MED ORDER — ONDANSETRON HCL 4 MG/2ML IJ SOLN
INTRAMUSCULAR | Status: DC | PRN
  Administered 2021-11-09: 4 mg via INTRAVENOUS

## 2021-11-09 MED ORDER — ROCURONIUM BROMIDE 10 MG/ML (PF) SYRINGE
PREFILLED_SYRINGE | INTRAVENOUS | Status: AC
  Filled 2021-11-09: qty 10

## 2021-11-09 MED ORDER — MIDAZOLAM HCL 2 MG/2ML IJ SOLN
INTRAMUSCULAR | Status: AC
  Filled 2021-11-09: qty 2

## 2021-11-09 MED ORDER — SODIUM BICARBONATE 4.2 % IV SOLN
INTRAVENOUS | Status: AC
  Filled 2021-11-09: qty 10

## 2021-11-09 MED ORDER — PROPOFOL 10 MG/ML IV BOLUS
INTRAVENOUS | Status: AC
  Filled 2021-11-09: qty 20

## 2021-11-09 MED ORDER — FENTANYL CITRATE (PF) 100 MCG/2ML IJ SOLN
INTRAMUSCULAR | Status: AC
  Filled 2021-11-09: qty 2

## 2021-11-09 MED ORDER — FENTANYL CITRATE (PF) 100 MCG/2ML IJ SOLN
INTRAMUSCULAR | Status: DC | PRN
  Administered 2021-11-09: 50 ug via INTRAVENOUS

## 2021-11-09 MED ORDER — CHLORHEXIDINE GLUCONATE CLOTH 2 % EX PADS: 6.0000 | MEDICATED_PAD | Freq: Once | CUTANEOUS | Status: DC

## 2021-11-09 MED ORDER — LACTATED RINGERS IV SOLN: INTRAVENOUS | Status: DC

## 2021-11-09 MED ORDER — BUPIVACAINE-EPINEPHRINE (PF) 0.5% -1:200000 IJ SOLN
INTRAMUSCULAR | Status: AC
  Filled 2021-11-09: qty 30

## 2021-11-09 MED ORDER — ACETAMINOPHEN 500 MG PO TABS
ORAL_TABLET | ORAL | Status: AC
Start: 2021-11-09 — End: ?
  Filled 2021-11-09: qty 2

## 2021-11-09 MED ORDER — DEXAMETHASONE SODIUM PHOSPHATE 10 MG/ML IJ SOLN
INTRAMUSCULAR | Status: AC
Start: 2021-11-09 — End: ?
  Filled 2021-11-09: qty 1

## 2021-11-09 MED ORDER — SCOPOLAMINE 1 MG/3DAYS TD PT72
1.0000 | MEDICATED_PATCH | TRANSDERMAL | Status: DC
  Administered 2021-11-09: 1.5 mg via TRANSDERMAL

## 2021-11-09 MED ORDER — BUPIVACAINE-EPINEPHRINE 0.5% -1:200000 IJ SOLN
INTRAMUSCULAR | Status: DC | PRN
  Administered 2021-11-09: 20 mL

## 2021-11-09 MED ORDER — LIDOCAINE HCL (CARDIAC) PF 100 MG/5ML IV SOSY
PREFILLED_SYRINGE | INTRAVENOUS | Status: DC | PRN
  Administered 2021-11-09: 50 mg via INTRAVENOUS

## 2021-11-09 MED ORDER — PROPOFOL 10 MG/ML IV BOLUS
INTRAVENOUS | Status: DC | PRN
Start: 2021-11-09 — End: 2021-11-09
  Administered 2021-11-09: 200 mg via INTRAVENOUS

## 2021-11-09 MED ORDER — DROPERIDOL 2.5 MG/ML IJ SOLN: 0.6250 mg | Freq: Once | INTRAMUSCULAR | Status: DC | PRN

## 2021-11-09 MED ORDER — PHENYLEPHRINE 80 MCG/ML (10ML) SYRINGE FOR IV PUSH (FOR BLOOD PRESSURE SUPPORT)
PREFILLED_SYRINGE | INTRAVENOUS | Status: AC
  Filled 2021-11-09: qty 10

## 2021-11-09 MED ORDER — PHENYLEPHRINE HCL (PRESSORS) 10 MG/ML IV SOLN
INTRAVENOUS | Status: DC | PRN
Start: 2021-11-09 — End: 2021-11-09
  Administered 2021-11-09: 80 ug via INTRAVENOUS
  Administered 2021-11-09: 120 ug via INTRAVENOUS
  Administered 2021-11-09: 80 ug via INTRAVENOUS

## 2021-11-09 MED ORDER — LIDOCAINE HCL (PF) 1 % IJ SOLN
INTRAMUSCULAR | Status: AC
  Filled 2021-11-09: qty 30

## 2021-11-09 MED ORDER — OXYCODONE HCL 5 MG/5ML PO SOLN: 5.0000 mg | Freq: Once | ORAL | Status: DC | PRN

## 2021-11-09 MED ORDER — DIPHENHYDRAMINE HCL 50 MG/ML IJ SOLN
25.0000 mg | Freq: Once | INTRAMUSCULAR | Status: AC
Start: 2021-11-09 — End: 2021-11-09
  Administered 2021-11-09: 25 mg via INTRAVENOUS

## 2021-11-09 MED ORDER — DEXAMETHASONE SODIUM PHOSPHATE 4 MG/ML IJ SOLN
INTRAMUSCULAR | Status: DC | PRN
Start: 2021-11-09 — End: 2021-11-09
  Administered 2021-11-09: 8 mg via INTRAVENOUS

## 2021-11-09 MED ORDER — OXYCODONE HCL 5 MG PO TABS: 5.0000 mg | ORAL_TABLET | Freq: Once | ORAL | Status: DC | PRN

## 2021-11-09 MED ORDER — ONDANSETRON HCL 4 MG/2ML IJ SOLN: 4.0000 mg | Freq: Once | INTRAMUSCULAR | Status: DC | PRN

## 2021-11-09 MED ORDER — ACETAMINOPHEN 500 MG PO TABS
1000.0000 mg | ORAL_TABLET | ORAL | Status: AC
  Administered 2021-11-09: 1000 mg via ORAL

## 2021-11-09 MED ORDER — ONDANSETRON HCL 4 MG/2ML IJ SOLN
INTRAMUSCULAR | Status: AC
  Filled 2021-11-09: qty 2

## 2021-11-09 MED ORDER — KETOROLAC TROMETHAMINE 30 MG/ML IJ SOLN
INTRAMUSCULAR | Status: AC
  Filled 2021-11-09: qty 1

## 2021-11-09 MED ORDER — HYDROMORPHONE HCL 1 MG/ML IJ SOLN: 0.2500 mg | INTRAMUSCULAR | Status: DC | PRN

## 2021-11-09 MED ORDER — DIPHENHYDRAMINE HCL 50 MG/ML IJ SOLN
INTRAMUSCULAR | Status: AC
  Filled 2021-11-09: qty 1

## 2021-11-09 MED ORDER — CIPROFLOXACIN IN D5W 400 MG/200ML IV SOLN
INTRAVENOUS | Status: AC
  Filled 2021-11-09: qty 200

## 2021-11-09 SURGICAL SUPPLY — 43 items
ADH SKN CLS APL DERMABOND .7 (GAUZE/BANDAGES/DRESSINGS) ×2
APL PRP STRL LF DISP 70% ISPRP (MISCELLANEOUS) ×1
BLADE CLIPPER SURG (BLADE) IMPLANT
BLADE SURG 15 STRL LF DISP TIS (BLADE) ×1 IMPLANT
BLADE SURG 15 STRL SS (BLADE) ×2
CANISTER SUCT 1200ML W/VALVE (MISCELLANEOUS) IMPLANT
CHLORAPREP W/TINT 26 (MISCELLANEOUS) ×2 IMPLANT
COVER BACK TABLE 60X90IN (DRAPES) ×2 IMPLANT
COVER MAYO STAND STRL (DRAPES) ×2 IMPLANT
DERMABOND ADVANCED (GAUZE/BANDAGES/DRESSINGS) ×2
DERMABOND ADVANCED .7 DNX12 (GAUZE/BANDAGES/DRESSINGS) ×2 IMPLANT
DRAPE LAPAROTOMY 100X72 PEDS (DRAPES) ×2 IMPLANT
DRAPE UTILITY XL STRL (DRAPES) ×2 IMPLANT
DRSG TEGADERM 2-3/8X2-3/4 SM (GAUZE/BANDAGES/DRESSINGS) IMPLANT
ELECT REM PT RETURN 9FT ADLT (ELECTROSURGICAL) ×2
ELECTRODE REM PT RTRN 9FT ADLT (ELECTROSURGICAL) ×1 IMPLANT
GLOVE SURG SIGNA 7.5 PF LTX (GLOVE) ×2 IMPLANT
GOWN STRL REUS W/ TWL LRG LVL3 (GOWN DISPOSABLE) ×1 IMPLANT
GOWN STRL REUS W/ TWL XL LVL3 (GOWN DISPOSABLE) ×1 IMPLANT
GOWN STRL REUS W/TWL LRG LVL3 (GOWN DISPOSABLE) ×2
GOWN STRL REUS W/TWL XL LVL3 (GOWN DISPOSABLE) ×2
MESH VENTRALEX ST 1-7/10 CRC S (Mesh General) ×1 IMPLANT
NDL HYPO 25X1 1.5 SAFETY (NEEDLE) ×1 IMPLANT
NEEDLE HYPO 25X1 1.5 SAFETY (NEEDLE) ×2 IMPLANT
NS IRRIG 1000ML POUR BTL (IV SOLUTION) IMPLANT
PACK BASIN DAY SURGERY FS (CUSTOM PROCEDURE TRAY) ×2 IMPLANT
PENCIL SMOKE EVACUATOR (MISCELLANEOUS) ×2 IMPLANT
SLEEVE SCD COMPRESS KNEE MED (STOCKING) ×2 IMPLANT
SPIKE FLUID TRANSFER (MISCELLANEOUS) IMPLANT
SPONGE T-LAP 4X18 ~~LOC~~+RFID (SPONGE) IMPLANT
SUT ETHIBOND NAB CT1 #1 30IN (SUTURE) ×1 IMPLANT
SUT MNCRL AB 4-0 PS2 18 (SUTURE) ×2 IMPLANT
SUT NOVA 0 T19/GS 22DT (SUTURE) ×1 IMPLANT
SUT NOVA NAB DX-16 0-1 5-0 T12 (SUTURE) IMPLANT
SUT NOVA NAB GS-21 1 T12 (SUTURE) IMPLANT
SUT VIC AB 2-0 SH 27 (SUTURE)
SUT VIC AB 2-0 SH 27XBRD (SUTURE) IMPLANT
SUT VIC AB 3-0 SH 27 (SUTURE) ×2
SUT VIC AB 3-0 SH 27X BRD (SUTURE) ×1 IMPLANT
SYR CONTROL 10ML LL (SYRINGE) ×2 IMPLANT
TOWEL GREEN STERILE FF (TOWEL DISPOSABLE) ×2 IMPLANT
TUBE CONNECTING 20X1/4 (TUBING) IMPLANT
YANKAUER SUCT BULB TIP NO VENT (SUCTIONS) IMPLANT

## 2021-11-09 NOTE — Discharge Instructions (Addendum)
[facility] _____[facility]  UMBILICAL OR INGUINAL HERNIA REPAIR: POST OP INSTRUCTIONS  Always review your discharge instruction sheet given to you by the facility where your surgery was performed. IF YOU HAVE DISABILITY OR FAMILY LEAVE FORMS, YOU MUST BRING THEM TO THE OFFICE FOR PROCESSING.   DO NOT GIVE THEM TO YOUR DOCTOR.  1. A  prescription for pain medication may be given to you upon discharge.  Take your pain medication as prescribed, if needed.  If narcotic pain medicine is not needed, then you may take acetaminophen (Tylenol) or ibuprofen (Advil) as needed. 2. Take your usually prescribed medications unless otherwise directed. If you need a refill on your pain medication, please contact your pharmacy.  They will contact our office to request authorization. Prescriptions will not be filled after 5 pm or on week-ends. 3. You should follow a light diet the first 24 hours after arrival home, such as soup and crackers, etc.  Be sure to include lots of fluids daily.  Resume your normal diet the day after surgery. 4.Most patients will experience some swelling and bruising around the umbilicus or in the groin and scrotum.  Ice packs and reclining will help.  Swelling and bruising can take several days to resolve.  6. It is common to experience some constipation if taking pain medication after surgery.  Increasing fluid intake and taking a stool softener (such as Colace) will usually help or prevent this problem from occurring.  A mild laxative (Milk of Magnesia or Miralax) should be taken according to package directions if there are no bowel movements after 48 hours. 7. Unless discharge instructions indicate otherwise, you may remove your bandages 24-48 hours after surgery, and you may shower at that time.  You may have steri-strips (small skin tapes) in place directly over the incision.  These strips should be left on the skin for 7-10 days.  If your surgeon used skin glue on the  incision, you may shower in 24 hours.  The glue will flake off over the next 2-3 weeks.  Any sutures or staples will be removed at the office during your follow-up visit. 8. ACTIVITIES:  You may resume regular (light) daily activities beginning the next day--such as daily self-care, walking, climbing stairs--gradually increasing activities as tolerated.  You may have sexual intercourse when it is comfortable.  Refrain from any heavy lifting or straining until approved by your doctor.  a.You may drive when you are no longer taking prescription pain medication, you can comfortably wear a seatbelt, and you can safely maneuver your car and apply brakes. b.RETURN TO WORK:   _____________________________________________  9.You should see your doctor in the office for a follow-up appointment approximately 2-3 weeks after your surgery.  Make sure that you call for this appointment within a day or two after you arrive home to insure a convenient appointment time. 10.OTHER INSTRUCTIONS: __OK TO SHOWER STARTING TOMORROW ICE PACK, TYLENOL, AND IBUPROFEN FOR PAIN NO LIFTING MORE THAN 15 POUNDS FOR 4 WEEKS_______________________    _____________________________________  WHEN TO CALL YOUR DOCTOR: Fever over 101.0 Inability to urinate Nausea and/or vomiting Extreme swelling or bruising Continued bleeding from incision. Increased pain, redness, or drainage from the incision  The clinic staff is available to answer your questions during regular business hours.  Please don't hesitate to call and ask to speak to one of the nurses for clinical concerns.  If you have a medical emergency, go to the nearest emergency room or call 911.  A  surgeon from [facility] is always on call at the hospital   [address], [place], [place], [place]  [address] ?  [address], [place], [place]   [address] [phone] ? [phone] ? FAX [phone] Web site: [email or web address]   No  Tylenol before 1:15pm 11/09/21.  No Ibuprofen before 3pm 11/09/21.   Post Anesthesia Home Care Instructions  Activity: Get plenty of rest for the remainder of the day. A responsible individual must stay with you for 24 hours following the procedure.  For the next 24 hours, DO NOT: -Drive a car -Paediatric nurse -Drink alcoholic beverages -Take any medication unless instructed by your physician -Make any legal decisions or sign important papers.  Meals: Start with liquid foods such as gelatin or soup. Progress to regular foods as tolerated. Avoid greasy, spicy, heavy foods. If nausea and/or vomiting occur, drink only clear liquids until the nausea and/or vomiting subsides. Call your physician if vomiting continues.  Special Instructions/Symptoms: Your throat may feel dry or sore from the anesthesia or the breathing tube placed in your throat during surgery. If this causes discomfort, gargle with warm salt water. The discomfort should disappear within 24 hours.  If you had a scopolamine patch placed behind your ear for the management of post- operative nausea and/or vomiting:  1. The medication in the patch is effective for 72 hours, after which it should be removed.  Wrap patch in a tissue and discard in the trash. Wash hands thoroughly with soap and water. 2. You may remove the patch earlier than 72 hours if you experience unpleasant side effects which may include dry mouth, dizziness or visual disturbances. 3. Avoid touching the patch. Wash your hands with soap and water after contact with the patch.

## 2021-11-09 NOTE — Anesthesia Preprocedure Evaluation (Addendum)
Anesthesia Evaluation  Patient identified by MRN, date of birth, ID band Patient awake    Reviewed: Allergy & Precautions, NPO status , Patient's Chart, lab work & pertinent test results, reviewed documented beta blocker date and time   History of Anesthesia Complications (+) PONV and history of anesthetic complications  Airway Mallampati: I  TM Distance: >3 FB Neck ROM: Full    Dental no notable dental hx. (+) Teeth Intact, Dental Advisory Given   Pulmonary asthma , former smoker,    Pulmonary exam normal breath sounds clear to auscultation       Cardiovascular hypertension, Pt. on medications Normal cardiovascular exam Rhythm:Regular Rate:Normal  EKG and Echo Normal   Neuro/Psych  Headaches, PSYCHIATRIC DISORDERS Anxiety  Neuromuscular disease    GI/Hepatic PUD, GERD  Medicated,(+) Hepatitis -Hx/o Barrett's esophagus   Endo/Other  Hypothyroidism Prediabetes  Renal/GU negative Renal ROS  negative genitourinary   Musculoskeletal  (+) Arthritis , Osteoarthritis,  Hx/o cervical fusion Umbilical hernia   Abdominal   Peds  Hematology negative hematology ROS (+)   Anesthesia Other Findings   Reproductive/Obstetrics                           Anesthesia Physical Anesthesia Plan  ASA: 2  Anesthesia Plan: General   Post-op Pain Management: Tylenol PO (pre-op)*, Dilaudid IV and Precedex   Induction: Intravenous and Cricoid pressure planned  PONV Risk Score and Plan: 4 or greater and Treatment may vary due to age or medical condition, Scopolamine patch - Pre-op, Midazolam, Dexamethasone and Ondansetron  Airway Management Planned: Oral ETT and LMA  Additional Equipment: None  Intra-op Plan:   Post-operative Plan: Extubation in OR  Informed Consent: I have reviewed the patients History and Physical, chart, labs and discussed the procedure including the risks, benefits and alternatives for  the proposed anesthesia with the patient or authorized representative who has indicated his/her understanding and acceptance.     Dental advisory given  Plan Discussed with: CRNA and Anesthesiologist  Anesthesia Plan Comments:         Anesthesia Quick Evaluation

## 2021-11-09 NOTE — Op Note (Signed)
OPEN UMBILICAL HERNIA REPAIR WITH MESH  Procedure Note  Glen [name] 11/09/2021   Pre-op Diagnosis: UMBILICAL HERNIA     Post-op Diagnosis: Umbilical hernia (1.5 cm fascial defect)  Procedure(s): OPEN UMBILICAL HERNIA REPAIR WITH MESH (4.3 cm round ventral patch)  Surgeon(s): Coralie Keens, MD  Anesthesia: General  Staff:  Circulator: Merwyn Katos, RN; Izora Ribas, RN Scrub Person: Ward, Scarlette Calico  Estimated Blood Loss: Minimal               Findings: The patient was found to have a 1.5 cm fascial defect above the umbilicus containing preperitoneal fat.  The hernia was repaired with a 4.3 cm round ventral light Prolene patch from [facility]  Procedure: The patient was brought to the operating room identifies a correct patient.  He was placed upon on the operating table and general anesthesia was induced.  His abdomen was prepped and draped in usual sterile fashion.  The palpable defect was several centimeters above the umbilicus.  I anesthetized the skin of the midline with Marcaine and then made a vertical incision with a scalpel.  I took this down to the hernia which was easily identified.  The hernia contained a moderate amount of preperitoneal fat.  This was excised down to the fascial edges with the cautery.  The fascial defect was 1.5 cm in size.  I could palpate the underlying peritoneum and fascia and found no defect at the umbilicus.  A 4.3 cm round ventral Prolene patch from [facility] was brought to the field.  It was placed through the fascial opening and then pulled up against the peritoneum with stay ties.  I sewed the mesh in circumferentially with 0 Novafil sutures.  I cut the stay ties.  I then closed the fascia over the top of the mesh with interrupted #1 at the bond sutures.  I anesthetized the fascia further with Marcaine.  Hemostasis appeared to be achieved.  I then closed the subcutaneous tissue with interrupted 3-0 Vicryl sutures and closed the skin with a running  4-0 Monocryl.  Dermabond was then applied.  The patient tolerated the procedure well.  All the counts were correct at the end of the procedure.  The patient was then extubated in the operating room and taken in a stable condition to the recovery room.          Coralie Keens   Date: 11/09/2021  Time: 9:12 AM

## 2021-11-09 NOTE — Anesthesia Procedure Notes (Signed)
Procedure Name: LMA Insertion Date/Time: 11/09/2021 8:43 AM Performed by: Bufford Spikes, CRNA Pre-anesthesia Checklist: Patient identified, Emergency Drugs available, Suction available and Patient being monitored Patient Re-evaluated:Patient Re-evaluated prior to induction Oxygen Delivery Method: Circle system utilized Preoxygenation: Pre-oxygenation with 100% oxygen Induction Type: IV induction Ventilation: Mask ventilation without difficulty LMA: LMA inserted LMA Size: 5.0 Number of attempts: 1 Placement Confirmation: positive ETCO2 Tube secured with: Tape Dental Injury: Teeth and Oropharynx as per pre-operative assessment

## 2021-11-09 NOTE — Transfer of Care (Signed)
Immediate Anesthesia Transfer of Care Note  Patient: Glen [name]  Procedure(s) Performed: OPEN UMBILICAL HERNIA REPAIR WITH MESH (Abdomen)  Patient Location: PACU  Anesthesia Type:General  Level of Consciousness: awake, alert  and oriented  Airway & Oxygen Therapy: Patient Spontanous Breathing and Patient connected to nasal cannula oxygen  Post-op Assessment: Report given to RN and Post -op Vital signs reviewed and stable  Post vital signs: Reviewed and stable  Last Vitals:  Vitals Value Taken Time  BP 118/66 11/09/21 0915  Temp    Pulse 75 11/09/21 0918  Resp 12 11/09/21 0918  SpO2 95 % 11/09/21 0918  Vitals shown include unvalidated device data.  Last Pain:  Vitals:   11/09/21 0710  TempSrc: Oral  PainSc: 6       Patients Stated Pain Goal: 4 (76/28/31 5176)  Complications: No notable events documented.

## 2021-11-09 NOTE — Anesthesia Postprocedure Evaluation (Signed)
Anesthesia Post Note  Patient: Glen [name]  Procedure(s) Performed: OPEN UMBILICAL HERNIA REPAIR WITH MESH (Abdomen)     Patient location during evaluation: PACU Anesthesia Type: General Level of consciousness: awake and alert and oriented Pain management: pain level controlled Vital Signs Assessment: post-procedure vital signs reviewed and stable Respiratory status: spontaneous breathing, nonlabored ventilation and respiratory function stable Cardiovascular status: blood pressure returned to baseline and stable Postop Assessment: no apparent nausea or vomiting Anesthetic complications: no   No notable events documented.  Last Vitals:  Vitals:   11/09/21 0945 11/09/21 1015  BP: 119/74 126/84  Pulse: 68 68  Resp: 10 16  Temp:  36.5 C  SpO2: 98% 98%    Last Pain:  Vitals:   11/09/21 1015  TempSrc:   PainSc: 0-No pain                 Itzael Liptak A.

## 2021-11-09 NOTE — Interval H&P Note (Signed)
History and Physical Interval Note: no change in H and P  11/09/2021 7:17 AM  Glen [name]  has presented today for surgery, with the diagnosis of UMBILICAL HERNIA.  The various methods of treatment have been discussed with the patient and family. After consideration of risks, benefits and other options for treatment, the patient has consented to  Procedure(s): OPEN UMBILICAL HERNIA REPAIR WITH MESH (N/A) as a surgical intervention.  The patient's history has been reviewed, patient examined, no change in status, stable for surgery.  I have reviewed the patient's chart and labs.  Questions were answered to the patient's satisfaction.     Coralie Keens

## 2021-11-10 ENCOUNTER — Encounter: Payer: Self-pay | Admitting: Internal Medicine

## 2021-11-10 ENCOUNTER — Encounter (HOSPITAL_BASED_OUTPATIENT_CLINIC_OR_DEPARTMENT_OTHER): Payer: Self-pay | Admitting: Surgery

## 2021-11-13 ENCOUNTER — Encounter: Payer: Self-pay | Admitting: Internal Medicine

## 2021-11-14 ENCOUNTER — Ambulatory Visit: Payer: 59 | Admitting: Internal Medicine

## 2021-11-14 NOTE — Telephone Encounter (Signed)
Rec'd fax determination Victoza has been denied. It states your medication request has been  denied .It does not appear to meed medically necessary requirement. Your plan requires a dx of type 2 Diabetes.../lm,b

## 2021-11-20 ENCOUNTER — Encounter: Payer: 59 | Admitting: Physical Medicine and Rehabilitation

## 2021-12-08 ENCOUNTER — Encounter: Payer: Self-pay | Admitting: Internal Medicine

## 2021-12-11 MED ORDER — OMEPRAZOLE 40 MG PO CPDR: 40.0000 mg | DELAYED_RELEASE_CAPSULE | Freq: Every day | ORAL | 3 refills | Status: DC

## 2021-12-14 ENCOUNTER — Encounter: Payer: Self-pay | Admitting: Physical Medicine and Rehabilitation

## 2021-12-14 ENCOUNTER — Other Ambulatory Visit: Payer: Self-pay | Admitting: Physical Medicine and Rehabilitation

## 2021-12-14 DIAGNOSIS — M5416 Radiculopathy, lumbar region: Secondary | ICD-10-CM

## 2021-12-15 ENCOUNTER — Encounter: Payer: Self-pay | Admitting: Physical Medicine and Rehabilitation

## 2021-12-18 NOTE — Progress Notes (Unsigned)
y

## 2021-12-23 ENCOUNTER — Ambulatory Visit (INDEPENDENT_AMBULATORY_CARE_PROVIDER_SITE_OTHER): Payer: 59

## 2021-12-23 DIAGNOSIS — M5416 Radiculopathy, lumbar region: Secondary | ICD-10-CM

## 2021-12-26 ENCOUNTER — Telehealth: Payer: Self-pay | Admitting: Physical Medicine and Rehabilitation

## 2021-12-26 NOTE — Telephone Encounter (Signed)
Called patient this morning to discuss new lumbar MRI imaging, there is a new small central disc protrusion at L5-S1. No spinal stenosis noted. Patient states he is doing very well and has no pain at this time. Patient instructed to let us know if his pain returns or changes in nature, I did inform him that we could try a different type of injection at a different level. Patient has no questions/concerns at this time.

## 2022-01-17 ENCOUNTER — Other Ambulatory Visit: Payer: Self-pay | Admitting: Internal Medicine

## 2022-02-04 ENCOUNTER — Encounter: Payer: Self-pay | Admitting: Orthopaedic Surgery

## 2022-02-07 ENCOUNTER — Other Ambulatory Visit: Payer: Self-pay | Admitting: Internal Medicine

## 2022-02-13 ENCOUNTER — Encounter: Payer: Self-pay | Admitting: Orthopaedic Surgery

## 2022-02-13 ENCOUNTER — Ambulatory Visit (INDEPENDENT_AMBULATORY_CARE_PROVIDER_SITE_OTHER): Payer: 59 | Admitting: Orthopaedic Surgery

## 2022-02-13 DIAGNOSIS — M1812 Unilateral primary osteoarthritis of first carpometacarpal joint, left hand: Secondary | ICD-10-CM | POA: Diagnosis not present

## 2022-02-13 MED ORDER — BUPIVACAINE HCL 0.5 % IJ SOLN
0.3300 mL | INTRAMUSCULAR | Status: AC | PRN
  Administered 2022-02-13: .33 mL

## 2022-02-13 MED ORDER — METHYLPREDNISOLONE ACETATE 40 MG/ML IJ SUSP
13.3300 mg | INTRAMUSCULAR | Status: AC | PRN
  Administered 2022-02-13: 13.33 mg

## 2022-02-13 MED ORDER — LIDOCAINE HCL 1 % IJ SOLN
0.3000 mL | INTRAMUSCULAR | Status: AC | PRN
  Administered 2022-02-13: .3 mL

## 2022-02-13 NOTE — Progress Notes (Signed)
Office Visit Note   Patient: Glen [name]           Date of Birth: February 24, 1966           MRN: [id number] Visit Date: 02/13/2022              Requested by: Cassandria Anger, MD [place],   [id number] PCP: Plotnikov, Evie Lacks, MD   Assessment & Plan: Visit Diagnoses:  1. Primary osteoarthritis of first carpometacarpal joint of left hand     Plan: Glen [name] returns today for a left thumb CMC arthritis.  We saw him back in March for this.  He has been trying to manage his symptoms with the brace and Voltaren gel.  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  He is interested in cortisone injection today.  Examination of left hand shows negative Finkelstein's and triggering.  Mild pain and crepitus with grind test.  His x-rays demonstrate CMC arthritis and decreased joint space.  Treatment options were explained and [name] underwent a cortisone injection into the [facility] joint of the thumb today.  He tolerated this well.  He will try to immobilize the thumb on his days off.  Follow-up as needed.  Follow-Up Instructions: No follow-ups on file.   Orders:  No orders of the defined types were placed in this encounter.  No orders of the defined types were placed in this encounter.     Procedures: Hand/UE Inj: L thumb CMC for osteoarthritis on 02/13/2022 8:44 AM Indications: pain Details: 25 G needle Medications: 0.3 mL lidocaine 1 %; 0.33 mL bupivacaine 0.5 %; 13.33 mg methylPREDNISolone acetate 40 MG/ML Outcome: tolerated well, no immediate complications      Clinical Data: No additional findings.   Subjective: Chief Complaint  Patient presents with   Left Hand - Pain    CMC arthritis    HPI  Review of Systems   Objective: Vital Signs: There were no vitals taken for this visit.  Physical Exam  Ortho Exam  Specialty Comments:  Lumbar MRI 05/21/2016  Impression: 1. Small left central L5-S1 disc protrusion is mildly progressed  without spinal canal stenosis or contact of descending nerve roots.  2. Small right central L3-L4 disc protrusion superimposed upon minimal bulging disc-osteophyte complex without spinal canal stenosis or subarticular zone narrowing, unchanged. 3. Stable to minimally progressed small right central-subarticular T12-L1 disc protrusion mildly narrows right subarticular zone contacting descending right L1 nerve roots without displacement or spinal canal stenosis.  Imaging: No results found.   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   Cervical spondylosis with radiculopathy 08/31/2020   Chest tightness 08/16/2020   Neck pain 08/08/2020   Motion sickness 05/18/2020   Shoulder pain, left 05/03/2020   Plantar fasciitis of left foot 01/18/2020   Wrist arthritis 01/18/2020   Dyslipidemia 03/09/2019   Otitis media 08/04/2018   Acute sinusitis 08/04/2018   Anal fissure 01/13/2018   Shoulder pain, right 01/13/2018   Ankle edema 11/25/2017   Trochanteric bursitis of left hip 11/25/2017   Degeneration of lumbar intervertebral disc 08/28/2016  Cough 08/24/2016   Wheezing 08/24/2016   Inflammatory arthritis 07/13/2016   Primary osteoarthritis of both hands 07/13/2016   Primary osteoarthritis of both feet 07/13/2016   Spondylosis of lumbar region without myelopathy or radiculopathy 07/13/2016   Trochanteric bursitis of both hips 07/13/2016   Acute bronchitis 07/06/2016   Constipation 07/12/2015   Vitamin D deficiency 07/12/2015   Pain of both heels 11/03/2013   Rhytides 04/13/2013    Arthritis pain, hand 11/17/2012   Blood pressure elevated without history of HTN 07/07/2012   Palpitations 04/07/2012   Patellar tendonitis, right 11/07/2011   Well adult exam 03/05/2011   Hx of adenomatous polyp of colon 11/03/2010   RECTAL PAIN 05/22/2010   HEMATOCHEZIA 05/22/2010   HYPOGLYCEMIA 12/01/2009   LOW BACK PAIN, ACUTE 12/01/2009   HERPES SIMPLEX WITHOUT MENTION OF COMPLICATION 16/03/9603   PANIC DISORDER 11/24/2009   GERD 09/13/2009   PES PLANUS 03/16/2009   SNORING 03/09/2009   Anxiety disorder 02/24/2009   BACK PAIN 08/23/2008   Pain in joint, lower leg 12/29/2007   MRSA INFECTION 01/21/2007   ALLERGIC RHINITIS 54/02/8118   Umbilical hernia 14/78/2956   Past Medical History:  Diagnosis Date   Allergic rhinitis    Anxiety    Arthritis    Asthmatic bronchitis    Barrett esophagus    Dry eyes    GERD (gastroesophageal reflux disease)    Headache    "prone to migraines"   Hepatitis B infection 2008   recovered   Hx of adenomatous polyp of colon 2012   Hypertension    Hypothyroidism 2010   Dr. Wende Bushy, been tested since and this is an inacurrate diagnosis   Internal hemorrhoids    MRSA infection    (15 years ago, left neck area)   PONV (postoperative nausea and vomiting)    Ulcerative proctitis ([place])    Urethritis 2010   Vitamin D deficiency     Family History  Problem Relation Age of Onset   Stroke Mother    Lupus Mother    Arthritis Mother    Heart disease Paternal Uncle 23       atrial fib   Arthritis Maternal Grandmother    Heart disease Paternal Grandfather 66       MI   Depression Other    Hypertension Other    Stroke Other    Pancreatic cancer Other    Liver cancer Other    Colon cancer Neg Hx    Esophageal cancer Neg Hx    Stomach cancer Neg Hx    Rectal cancer Neg Hx     Past Surgical History:  Procedure Laterality Date   ANTERIOR CERVICAL DECOMP/DISCECTOMY FUSION N/A 08/31/2020   Procedure: ANTERIOR CERVICAL  DECOMPRESSION/DISCECTOMY FUSION, INTERBODY PROSTHESIS, PLATE/SCREWS CERVICAL FIVE-SIX, CERVICAL SIX-SEVEN;  Surgeon: Newman Pies, MD;  Location: [place];  Service: Neurosurgery;  Laterality: N/A;  ANTERIOR CERVICAL DECOMPRESSION/DISCECTOMY FUSION, INTERBODY PROSTHESIS, PLATE/SCREWS CERVICAL FIVE-SIX, CERVICAL SIX-SEVEN   COLONOSCOPY     multiple - adenoma 2012   ESOPHAGOGASTRODUODENOSCOPY     multiple - barrett's   FACIAL COSMETIC SURGERY     HEMORRHOID BANDING     [place]   LIPOSUCTION     TEAR DUCT PROBING  2130   UMBILICAL HERNIA REPAIR N/A 11/09/2021   Procedure: OPEN UMBILICAL HERNIA REPAIR WITH MESH;  Surgeon: Coralie Keens, MD;  Location: [place];  Service: General;  Laterality: N/A;   Social History   Occupational History   Not on file  Tobacco Use   Smoking status: Former    Packs/day: 1.50    Types: Cigarettes    Quit date: 06/19/1999    Years since quitting: 22.6   Smokeless tobacco: Never  Vaping Use   Vaping Use: Never used  Substance and Sexual Activity   Alcohol use: Yes    Comment: a glass of wine each night.   Drug use: No   Sexual activity: Yes

## 2022-03-05 ENCOUNTER — Other Ambulatory Visit: Payer: Self-pay | Admitting: Internal Medicine

## 2022-03-05 ENCOUNTER — Encounter: Payer: Self-pay | Admitting: Internal Medicine

## 2022-03-06 MED ORDER — VALSARTAN 80 MG PO TABS: 80.0000 mg | ORAL_TABLET | Freq: Every day | ORAL | 0 refills | Status: DC

## 2022-03-06 NOTE — Addendum Note (Signed)
Addended by: Earnstine Regal on: 03/06/2022 09:21 AM   Modules accepted: Orders

## 2022-03-26 ENCOUNTER — Encounter: Payer: Self-pay | Admitting: Internal Medicine

## 2022-03-26 ENCOUNTER — Ambulatory Visit (INDEPENDENT_AMBULATORY_CARE_PROVIDER_SITE_OTHER): Payer: BC Managed Care – PPO | Admitting: Internal Medicine

## 2022-03-26 VITALS — BP 128/80 | HR 58 | Temp 98.3°F | Ht 73.0 in | Wt 205.6 lb

## 2022-03-26 DIAGNOSIS — E785 Hyperlipidemia, unspecified: Secondary | ICD-10-CM | POA: Diagnosis not present

## 2022-03-26 DIAGNOSIS — Z0000 Encounter for general adult medical examination without abnormal findings: Secondary | ICD-10-CM

## 2022-03-26 DIAGNOSIS — R739 Hyperglycemia, unspecified: Secondary | ICD-10-CM

## 2022-03-26 LAB — URINALYSIS
Bilirubin Urine: NEGATIVE
Hgb urine dipstick: NEGATIVE
Ketones, ur: NEGATIVE
Leukocytes,Ua: NEGATIVE
Nitrite: NEGATIVE
Specific Gravity, Urine: <=1.005 — AB (ref 1.000–1.030)
Total Protein, Urine: NEGATIVE
Urine Glucose: NEGATIVE
Urobilinogen, UA: 0.2 (ref 0.0–1.0)
pH: 7 (ref 5.0–8.0)

## 2022-03-26 LAB — COMPREHENSIVE METABOLIC PANEL
ALT: 16 U/L (ref 0–53)
AST: 19 U/L (ref 0–37)
Albumin: 4.5 g/dL (ref 3.5–5.2)
Alkaline Phosphatase: 58 U/L (ref 39–117)
BUN: 14 mg/dL (ref 6–23)
CO2: 28 mEq/L (ref 19–32)
Calcium: 9.8 mg/dL (ref 8.4–10.5)
Chloride: 101 mEq/L (ref 96–112)
Creatinine, Ser: 0.9 mg/dL (ref 0.40–1.50)
GFR: 95.09 mL/min (ref >60.00)
Glucose, Bld: 98 mg/dL (ref 70–99)
Potassium: 4.1 mEq/L (ref 3.5–5.1)
Sodium: 138 mEq/L (ref 135–145)
Total Bilirubin: 0.7 mg/dL (ref 0.2–1.2)
Total Protein: 7.1 g/dL (ref 6.0–8.3)

## 2022-03-26 LAB — CBC WITH DIFFERENTIAL/PLATELET
Basophils Absolute: 0 10*3/uL (ref 0.0–0.1)
Basophils Relative: 0.5 % (ref 0.0–3.0)
Eosinophils Absolute: 0.1 10*3/uL (ref 0.0–0.7)
Eosinophils Relative: 2.6 % (ref 0.0–5.0)
HCT: 45.2 % (ref 39.0–52.0)
Hemoglobin: 15.4 g/dL (ref 13.0–17.0)
Lymphocytes Relative: 27.2 % (ref 12.0–46.0)
Lymphs Abs: 1.5 10*3/uL (ref 0.7–4.0)
MCHC: 34.1 g/dL (ref 30.0–36.0)
MCV: 91 fl (ref 78.0–100.0)
Monocytes Absolute: 0.5 10*3/uL (ref 0.1–1.0)
Monocytes Relative: 8.3 % (ref 3.0–12.0)
Neutro Abs: 3.4 10*3/uL (ref 1.4–7.7)
Neutrophils Relative %: 61.4 % (ref 43.0–77.0)
Platelets: 229 10*3/uL (ref 150.0–400.0)
RBC: 4.96 Mil/uL (ref 4.22–5.81)
RDW: 12.7 % (ref 11.5–15.5)
WBC: 5.6 10*3/uL (ref 4.0–10.5)

## 2022-03-26 LAB — LIPID PANEL
Cholesterol: 212 mg/dL — ABNORMAL HIGH (ref 0–200)
HDL: 62.2 mg/dL (ref >39.00)
LDL Cholesterol: 139 mg/dL — ABNORMAL HIGH (ref 0–99)
NonHDL: 149.3
Total CHOL/HDL Ratio: 3
Triglycerides: 54 mg/dL (ref 0.0–149.0)
VLDL: 10.8 mg/dL (ref 0.0–40.0)

## 2022-03-26 LAB — PSA: PSA: 1.59 ng/mL (ref 0.10–4.00)

## 2022-03-26 LAB — TSH: TSH: 1.65 u[IU]/mL (ref 0.35–5.50)

## 2022-03-26 LAB — HEMOGLOBIN A1C: Hgb A1c MFr Bld: 5.6 % (ref 4.6–6.5)

## 2022-03-26 MED ORDER — VALSARTAN 80 MG PO TABS: 80.0000 mg | ORAL_TABLET | Freq: Every day | ORAL | 3 refills | Status: DC

## 2022-03-26 NOTE — Progress Notes (Addendum)
Subjective:  Patient ID: Glen [name], male    DOB: [birth year]  Age: 56 y.o. MRN: [id number]  CC: Follow-up (4-6 month f/u)   HPI Glen [name] presents for anxiety, HTN, HAs Pt lost 15 lbs   Outpatient Medications Prior to Visit  Medication Sig Dispense Refill   ALPRAZolam (XANAX) 0.25 MG tablet Take 1 tablet (0.25 mg total) by mouth 2 (two) times daily as needed for anxiety (Panic attacks). 60 tablet 1   aspirin-acetaminophen-caffeine (EXCEDRIN MIGRAINE) 250-250-65 MG tablet Take by mouth every 6 (six) hours as needed for headache.     citalopram (CELEXA) 20 MG tablet Take 0.5 tablets (10 mg total) by mouth daily. 30 tablet 5   diclofenac Sodium (VOLTAREN) 1 % GEL Apply 2 g topically 4 (four) times daily.     LUMIFY 0.025 % SOLN SMARTSIG:1 Drop(s) In Eye(s) PRN     omeprazole (PRILOSEC) 40 MG capsule Take 1 capsule (40 mg total) by mouth daily. 90 capsule 3   valACYclovir (VALTREX) 500 MG tablet TAKE 1 TABLET BY MOUTH TWICE DAILY AS NEEDED FOR FEVER BLISTERS FOR 5 DAYS (Patient taking differently: Take 500 mg by mouth daily as needed (fever blisters).) 30 tablet 0   ZOMIG 5 MG nasal solution USE ONE DOSE IN NOSTRIL AS NEEDED FOR MIGRAINE (Patient taking differently: Place 1 spray into the nose as needed for migraine.) 6 Units 3   valsartan (DIOVAN) 80 MG tablet Take 1 tablet (80 mg total) by mouth daily. annual-up appt is due must see provider for future refills 30 tablet 0   No facility-administered medications prior to visit.    ROS: Review of Systems  Constitutional:  Negative for appetite change, fatigue and unexpected weight change.  HENT:  Negative for congestion, nosebleeds, sneezing, sore throat and trouble swallowing.   Eyes:  Negative for itching and visual disturbance.  Respiratory:  Negative for cough.   Cardiovascular:  Negative for chest pain, palpitations and leg swelling.  Gastrointestinal:  Negative for abdominal distention, blood in stool, diarrhea and nausea.   Genitourinary:  Negative for frequency and hematuria.  Musculoskeletal:  Negative for back pain, gait problem, joint swelling and neck pain.  Skin:  Negative for rash.  Neurological:  Negative for dizziness, tremors, speech difficulty and weakness.  Psychiatric/Behavioral:  Negative for agitation, dysphoric mood and sleep disturbance. The patient is not nervous/anxious.     Objective:  BP 128/80 (BP Location: Left Arm)   Pulse (!) 58   Temp 98.3 F (36.8 C) (Oral)   Ht 6' 1"  (1.854 m)   Wt 205 lb 9.6 oz (93.3 kg)   SpO2 98%   BMI 27.13 kg/m   BP Readings from Last 3 Encounters:  03/26/22 128/80  11/09/21 126/84  11/07/21 132/80    Wt Readings from Last 3 Encounters:  03/26/22 205 lb 9.6 oz (93.3 kg)  11/09/21 218 lb 11.1 oz (99.2 kg)  11/07/21 220 lb 12.8 oz (100.2 kg)    Physical Exam Constitutional:      General: He is not in acute distress.    Appearance: He is well-developed.     Comments: NAD  Eyes:     Conjunctiva/sclera: Conjunctivae normal.     Pupils: Pupils are equal, round, and reactive to light.  Neck:     Thyroid: No thyromegaly.     Vascular: No JVD.  Cardiovascular:     Rate and Rhythm: Normal rate and regular rhythm.     Heart sounds: Normal heart sounds. No  murmur heard.    No friction rub. No gallop.  Pulmonary:     Effort: Pulmonary effort is normal. No respiratory distress.     Breath sounds: Normal breath sounds. No wheezing or rales.  Chest:     Chest wall: No tenderness.  Abdominal:     General: Bowel sounds are normal. There is no distension.     Palpations: Abdomen is soft. There is no mass.     Tenderness: There is no abdominal tenderness. There is no guarding or rebound.  Musculoskeletal:        General: No tenderness. Normal range of motion.     Cervical back: Normal range of motion.  Lymphadenopathy:     Cervical: No cervical adenopathy.  Skin:    General: Skin is warm and dry.     Findings: No rash.  Neurological:      Mental Status: He is alert and oriented to person, place, and time.     Cranial Nerves: No cranial nerve deficit.     Motor: No abnormal muscle tone.     Coordination: Coordination normal.     Gait: Gait normal.     Deep Tendon Reflexes: Reflexes are normal and symmetric.  Psychiatric:        Behavior: Behavior normal.        Thought Content: Thought content normal.        Judgment: Judgment normal.    Rectal - per GI   Lab Results  Component Value Date   WBC 5.6 03/26/2022   HGB 15.4 03/26/2022   HCT 45.2 03/26/2022   PLT 229.0 03/26/2022   GLUCOSE 98 03/26/2022   CHOL 212 (H) 03/26/2022   TRIG 54.0 03/26/2022   HDL 62.20 03/26/2022   LDLDIRECT 150.6 06/29/2013   LDLCALC 139 (H) 03/26/2022   ALT 16 03/26/2022   AST 19 03/26/2022   NA 138 03/26/2022   K 4.1 03/26/2022   CL 101 03/26/2022   CREATININE 0.90 03/26/2022   BUN 14 03/26/2022   CO2 28 03/26/2022   TSH 1.65 03/26/2022   PSA 1.59 03/26/2022   HGBA1C 5.6 03/26/2022    No results found.  Assessment & Plan:   Problem List Items Addressed This Visit     Dyslipidemia   Hyperglycemia   Relevant Orders   Hemoglobin A1c (Completed)   Well adult exam - Primary     We discussed age appropriate health related issues, including available/recomended screening tests and vaccinations. Labs were ordered to be later reviewed . All questions were answered. We discussed one or more of the following - seat belt use, use of sunscreen/sun exposure exercise, safe sex, fall risk reduction, second hand smoke exposure, firearm use and storage, seat belt use, a need for adhering to healthy diet and exercise. Labs were ordered.  All questions were answered.      Relevant Orders   TSH (Completed)   Urinalysis (Completed)   CBC with Differential/Platelet (Completed)   Lipid panel (Completed)   PSA (Completed)   Comprehensive metabolic panel (Completed)   Hemoglobin A1c (Completed)      Meds ordered this encounter   Medications   valsartan (DIOVAN) 80 MG tablet    Sig: Take 1 tablet (80 mg total) by mouth daily.    Dispense:  90 tablet    Refill:  3      Follow-up: Return in about 6 months (around 09/25/2022) for a follow-up visit.  Walker Kehr, MD

## 2022-04-01 ENCOUNTER — Encounter: Payer: Self-pay | Admitting: Internal Medicine

## 2022-04-02 NOTE — Assessment & Plan Note (Signed)

## 2022-04-03 ENCOUNTER — Other Ambulatory Visit: Payer: Self-pay | Admitting: Internal Medicine

## 2022-04-03 MED ORDER — AZITHROMYCIN 250 MG PO TABS: ORAL_TABLET | ORAL | 0 refills | Status: DC

## 2022-04-13 ENCOUNTER — Telehealth: Payer: Self-pay | Admitting: Orthopaedic Surgery

## 2022-04-13 NOTE — Telephone Encounter (Signed)
Patient called advised he needed a copy of his X-ray that was done on his left hand. I advised patient disc will be at the front desk for him to pick up.    [phone]

## 2022-04-30 DIAGNOSIS — M1812 Unilateral primary osteoarthritis of first carpometacarpal joint, left hand: Secondary | ICD-10-CM | POA: Diagnosis not present

## 2022-05-28 ENCOUNTER — Encounter: Payer: Self-pay | Admitting: Internal Medicine

## 2022-05-30 ENCOUNTER — Other Ambulatory Visit: Payer: Self-pay | Admitting: Internal Medicine

## 2022-05-30 MED ORDER — ZOLMITRIPTAN 5 MG PO TABS: 5.0000 mg | ORAL_TABLET | ORAL | 3 refills | Status: DC | PRN

## 2022-05-30 MED ORDER — ZOLMITRIPTAN 5 MG NA SOLN: 1.0000 | NASAL | 3 refills | Status: DC | PRN

## 2022-06-01 ENCOUNTER — Telehealth: Payer: Self-pay | Admitting: *Deleted

## 2022-06-01 NOTE — Telephone Encounter (Signed)
Pt need PA on Zolmitiptan 28m sol. Submitted w/ (Key: [id number]) [place] [employer]..Marland KitchenJohny Chess

## 2022-06-04 ENCOUNTER — Telehealth: Payer: Self-pay | Admitting: Internal Medicine

## 2022-06-04 NOTE — Telephone Encounter (Signed)
Joana with [employer] called and wanted to know if Sumatriptan Succinate 51m over 0.543msolution can be sent in for patient instead since the Zolmitiptan was denied.

## 2022-06-04 NOTE — Telephone Encounter (Signed)
Pt is not on succinate tablets. Insurance called to ask if Sumatriptan Succinate could be called in. See previous msg../lmb

## 2022-06-04 NOTE — Telephone Encounter (Signed)
Patient would also like the succinate tablets called in also.    Please send to [facility] [place] [facility] [employer]

## 2022-06-05 ENCOUNTER — Encounter: Payer: Self-pay | Admitting: Internal Medicine

## 2022-06-05 MED ORDER — SUMATRIPTAN SUCCINATE 4 MG/0.5ML ~~LOC~~ SOAJ: 1.0000 | Freq: Every day | SUBCUTANEOUS | 11 refills | Status: DC

## 2022-06-05 MED ORDER — SUMATRIPTAN SUCCINATE 6 MG/0.5ML ~~LOC~~ SOCT: 1.0000 | Freq: Every day | SUBCUTANEOUS | 5 refills | Status: DC

## 2022-06-05 MED ORDER — ZOLMITRIPTAN 5 MG PO TABS: 5.0000 mg | ORAL_TABLET | ORAL | 3 refills | Status: AC | PRN

## 2022-06-05 MED ORDER — SUMATRIPTAN 20 MG/ACT NA SOLN: 20.0000 mg | NASAL | 5 refills | Status: AC | PRN

## 2022-06-05 NOTE — Telephone Encounter (Signed)
Sent the alternative that was recommended by insurance Sumatriptan Nasal Spray. Responded to pt msg on mychart. Rx sent to [facility].Marland KitchenJohny Chess

## 2022-06-05 NOTE — Addendum Note (Signed)
Addended by: Earnstine Regal on: 06/05/2022 11:44 AM   Modules accepted: Orders

## 2022-06-05 NOTE — Addendum Note (Signed)
Addended by: Earnstine Regal on: 06/05/2022 03:20 PM   Modules accepted: Orders

## 2022-06-05 NOTE — Telephone Encounter (Signed)
Sumatriptan Succinate 39m/5ml have already been sent see previous msg../lmb

## 2022-06-06 NOTE — Telephone Encounter (Signed)
Sumatriptin needs a prior authorization

## 2022-06-14 ENCOUNTER — Telehealth: Payer: Self-pay | Admitting: *Deleted

## 2022-06-14 NOTE — Telephone Encounter (Signed)
Rec'd fax stating pls clarify Spay or Injector for Sumatriptan Succinate 57m/0.5ml sol. Pt called back stating insurance will cover the Sumatriptan 245mact nasal spray, but to send it to [facility] in which it was. Will notify pharmacy of change../Marland KitchenJohny Chess

## 2022-07-11 ENCOUNTER — Telehealth: Payer: Self-pay | Admitting: Internal Medicine

## 2022-07-11 DIAGNOSIS — K6289 Other specified diseases of anus and rectum: Secondary | ICD-10-CM

## 2022-07-11 NOTE — Telephone Encounter (Signed)
Pt stating that he is having a proctitis flare with pressure and mucous that has been going on for 10 das now: Pt requesting a prescription for  generic suppositories: Pt chart reviewed. Last seen in office in 2022: Offered office visit for evaluation with treatment on 07/16/2022 although pt stated that he does not need an office visit because he knows what it is and just needs a prescription: Please advise

## 2022-07-11 NOTE — Telephone Encounter (Signed)
I have pended a prescription for mesalamine suppository.  This is generic but his insurance still does not cover it.  I have not prescribed it for him before the last one was Dr. Earlean Shawl.  I am willing to prescribe 30 of these and if that does not get it under control he needs to call back and we need to set up a follow-up.  My suggestion is to use GoodRx and he can get it for $76 at [facility].  His pharmacy is [facility], that is owned by [facility] so perhaps it would be $76 there as well.  I do not know so he can decide if he wants [place] to send it there or to a [facility].  He will need to look up GoodRx online to get this cash price.  If he thinks that is too expensive there is 1 other possible option which would be hydrocortisone suppositories 25 mg per rectum #24 no refills.  Mesalamine is the preferred treatment in this setting but we could use the hydrocortisone.

## 2022-07-11 NOTE — Telephone Encounter (Signed)
Patient is having a proctitis flare with mucous and pressure. He was prescribed suppositories before but were expensive and wants to have a generic version called in if possible. Please advise.

## 2022-07-12 ENCOUNTER — Encounter: Payer: Self-pay | Admitting: Internal Medicine

## 2022-07-12 MED ORDER — MESALAMINE 1000 MG RE SUPP: 1000.0000 mg | Freq: Every day | RECTAL | 0 refills | Status: DC

## 2022-07-12 NOTE — Telephone Encounter (Signed)
Pt made aware of Dr. Carlean Purl recommendations: Prescription sent to pharmacy: Pt made aware: Pt verbalized understanding with all questions answered.

## 2022-08-23 ENCOUNTER — Encounter: Payer: Self-pay | Admitting: Internal Medicine

## 2022-08-24 ENCOUNTER — Other Ambulatory Visit: Payer: Self-pay | Admitting: Internal Medicine

## 2022-08-24 DIAGNOSIS — R002 Palpitations: Secondary | ICD-10-CM

## 2022-09-24 ENCOUNTER — Telehealth: Payer: Self-pay | Admitting: Internal Medicine

## 2022-09-24 NOTE — Telephone Encounter (Signed)
Pt stated that he is taking a compound for [facility] and it is causing him to have heartburn, wondering if its okay to take more of his Omeprazole: Pt currently taking 40 mg daily.  Please advise.

## 2022-09-24 NOTE — Telephone Encounter (Signed)
Patient is calling states he is taking a compound for [facility] and it is causing him to have heartburn, wondering if its okay to take more of his Pantoprazole. Please advise

## 2022-09-25 ENCOUNTER — Encounter: Payer: Self-pay | Admitting: Internal Medicine

## 2022-09-25 NOTE — Telephone Encounter (Signed)
It sounds to me like he needs to stop the medication that is making him feel worse rather than add more medication.  I recommend he stop the compounded Wegovy.  Any further recommendations require an office visit

## 2022-09-26 NOTE — Telephone Encounter (Signed)
Pt was made aware of Dr. Leone Payor recommendations: Pt stated that he is not going to stop taking the medication because it is helping him loose weight. Pt was officered an office visit. Pt stated that he would call back

## 2022-09-27 ENCOUNTER — Other Ambulatory Visit: Payer: Self-pay | Admitting: Internal Medicine

## 2022-09-27 MED ORDER — PANTOPRAZOLE SODIUM 40 MG PO TBEC: 40.0000 mg | DELAYED_RELEASE_TABLET | Freq: Two times a day (BID) | ORAL | 11 refills | Status: DC

## 2022-10-05 ENCOUNTER — Ambulatory Visit: Payer: BC Managed Care – PPO | Admitting: Cardiology

## 2022-10-28 ENCOUNTER — Other Ambulatory Visit: Payer: Self-pay | Admitting: Internal Medicine

## 2022-11-16 ENCOUNTER — Ambulatory Visit: Payer: Self-pay | Admitting: Cardiology

## 2022-12-01 ENCOUNTER — Encounter: Payer: Self-pay | Admitting: Internal Medicine

## 2022-12-17 ENCOUNTER — Ambulatory Visit: Payer: Commercial Managed Care - HMO | Admitting: Internal Medicine

## 2023-02-04 NOTE — Progress Notes (Signed)
Referring-Aleksei Plotnikov MD Reason for referral-atrial fibrillation  HPI: 57 year old male for evaluation of atrial fibrillation at request of Jacinta Shoe MD.  Echocardiogram November 2013 showed normal LV function, trace aortic insufficiency, mild biatrial enlargement.  Calcium score June 2022 0.  Laboratories October 2023 showed TSH 1.65, total cholesterol 212 with LDL 139, potassium 4.1, creatinine 0.90.  Patient has had intermittent palpitations for 5 to 6 years.  They are described as his heart fluttering and can last 2 hours and resolve spontaneously.  He has associated tightness in his jaws.  He had a prolonged episode in February with associated discomfort in his arms bilaterally.  His episodes have become more frequent occurring 2-3 times per month.  Also more prolonged.  Cardiology now asked to evaluate.  Note he is very physically active and can run 5 miles without increased dyspnea, chest pain and there is no history of syncope.  Current Outpatient Medications  Medication Sig Dispense Refill   aspirin-acetaminophen-caffeine (EXCEDRIN MIGRAINE) 250-250-65 MG tablet Take by mouth every 6 (six) hours as needed for headache.     celecoxib (CELEBREX) 200 MG capsule Take 200 mg by mouth daily.     citalopram (CELEXA) 20 MG tablet Take 1/2 (one-half) tablet by mouth once daily 30 tablet 5   LUMIFY 0.025 % SOLN SMARTSIG:1 Drop(s) In Eye(s) PRN     omeprazole (PRILOSEC) 40 MG capsule Take 40 mg by mouth daily.     pantoprazole (PROTONIX) 40 MG tablet Take 1 tablet (40 mg total) by mouth 2 (two) times daily before a meal. 60 tablet 11   ALPRAZolam (XANAX) 0.25 MG tablet Take 1 tablet (0.25 mg total) by mouth 2 (two) times daily as needed for anxiety (Panic attacks). (Patient not taking: Reported on 02/11/2023) 60 tablet 1   azithromycin (ZITHROMAX Z-PAK) 250 MG tablet As directed (Patient not taking: Reported on 02/11/2023) 6 each 0   diclofenac Sodium (VOLTAREN) 1 % GEL Apply 2 g  topically 4 (four) times daily. (Patient not taking: Reported on 02/11/2023)     mesalamine (CANASA) 1000 MG suppository Place 1 suppository (1,000 mg total) rectally at bedtime. (Patient not taking: Reported on 02/11/2023) 30 suppository 0   ondansetron (ZOFRAN-ODT) 4 MG disintegrating tablet Take 4 mg by mouth every 8 (eight) hours as needed. (Patient not taking: Reported on 02/11/2023)     SUMAtriptan (IMITREX) 20 MG/ACT nasal spray Place 1 spray (20 mg total) into the nose every 2 (two) hours as needed for migraine or headache. May repeat in 2 hours if headache persists or recurs. (Patient not taking: Reported on 02/11/2023) 1 each 5   valACYclovir (VALTREX) 500 MG tablet TAKE 1 TABLET BY MOUTH TWICE DAILY AS NEEDED FOR FEVER BLISTERS FOR 5 DAYS (Patient not taking: Reported on 02/11/2023) 30 tablet 0   valsartan (DIOVAN) 80 MG tablet Take 1 tablet (80 mg total) by mouth daily. (Patient not taking: Reported on 02/11/2023) 90 tablet 3   zolmitriptan (ZOMIG) 5 MG tablet Take 1 tablet (5 mg total) by mouth as needed for migraine. (Patient not taking: Reported on 02/11/2023) 10 tablet 3   No current facility-administered medications for this visit.    Allergies  Allergen Reactions   Codeine Other (See Comments)    "Makes me really sick, I got hallucinations when the dose was really high" 08/13/20 Pt also states he is "very" sensitive to strong pain medications   Hydrocodone Nausea And Vomiting   Mounjaro [Tirzepatide]     Dizziness and  nausea   Penicillins Other (See Comments)    "I don't remember, but mom said don't take" 08/13/20     Past Medical History:  Diagnosis Date   Allergic rhinitis    Anxiety    Arthritis    Asthmatic bronchitis    Barrett esophagus    Dry eyes    GERD (gastroesophageal reflux disease)    Headache    "prone to migraines"   Hepatitis B infection 2008   recovered   Hx of adenomatous polyp of colon 2012   Hypertension    Hypothyroidism 2010   Dr. Raquel Sarna,  been tested since and this is an inacurrate diagnosis   Internal hemorrhoids    MRSA infection    (15 years ago, left neck area)   PONV (postoperative nausea and vomiting)    Ulcerative proctitis (HCC)    Urethritis 2010   Vitamin D deficiency     Past Surgical History:  Procedure Laterality Date   ANTERIOR CERVICAL DECOMP/DISCECTOMY FUSION N/A 08/31/2020   Procedure: ANTERIOR CERVICAL DECOMPRESSION/DISCECTOMY FUSION, INTERBODY PROSTHESIS, PLATE/SCREWS CERVICAL FIVE-SIX, CERVICAL SIX-SEVEN;  Surgeon: Tressie Stalker, MD;  Location: [facility] OR;  Service: Neurosurgery;  Laterality: N/A;  ANTERIOR CERVICAL DECOMPRESSION/DISCECTOMY FUSION, INTERBODY PROSTHESIS, PLATE/SCREWS CERVICAL FIVE-SIX, CERVICAL SIX-SEVEN   COLONOSCOPY     multiple - adenoma 2012   ESOPHAGOGASTRODUODENOSCOPY     multiple - barrett's   FACIAL COSMETIC SURGERY     HEMORRHOID BANDING     [place]   LIPOSUCTION     TEAR DUCT PROBING  2009   UMBILICAL HERNIA REPAIR N/A 11/09/2021   Procedure: OPEN UMBILICAL HERNIA REPAIR WITH MESH;  Surgeon: Abigail Miyamoto, MD;  Location: [facility];  Service: General;  Laterality: N/A;    Social History   Socioeconomic History   Marital status: Married    Spouse name: Not on file   Number of children: Not on file   Years of education: Not on file   Highest education level: Not on file  Occupational History   Not on file  Tobacco Use   Smoking status: Former    Current packs/day: 0.00    Types: Cigarettes    Quit date: 06/19/1999    Years since quitting: 23.6   Smokeless tobacco: Never  Vaping Use   Vaping status: Never Used  Substance and Sexual Activity   Alcohol use: Yes    Comment: a glass of wine each night.   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   Former smoker, 1 alcoholic beverage a day 2 caffeinated beverages a  day no drug use or tobacco now   Regular Exercise- yes   Social Determinants of Corporate investment banker Strain: Not on file  Food Insecurity: Not on file  Transportation Needs: Not on file  Physical Activity: Not on file  Stress: Not on file  Social Connections: Not on file  Intimate Partner Violence: Not on file    Family History  Problem Relation Age of Onset   Stroke Mother    Lupus Mother    Arthritis Mother    Heart disease Paternal Uncle 32       atrial fib   Arthritis Maternal Grandmother    Heart disease Paternal Grandfather 19       MI   Depression Other  Hypertension Other    Stroke Other    Pancreatic cancer Other    Liver cancer Other    Colon cancer Neg Hx    Esophageal cancer Neg Hx    Stomach cancer Neg Hx    Rectal cancer Neg Hx     ROS: no fevers or chills, productive cough, hemoptysis, dysphasia, odynophagia, melena, hematochezia, dysuria, hematuria, rash, seizure activity, orthopnea, PND, pedal edema, claudication. Remaining systems are negative.  Physical Exam:   Blood pressure 126/68, pulse 68, height 6\' 2"  (1.88 m), weight 208 lb 3.2 oz (94.4 kg), SpO2 96%.  General:  Well developed/well nourished in NAD Skin warm/dry Patient not depressed No peripheral clubbing Back-normal HEENT-normal/normal eyelids Neck supple/normal carotid upstroke bilaterally; no bruits; no JVD; no thyromegaly chest - CTA/ normal expansion CV - RRR/normal S1 and S2; no murmurs, rubs or gallops;  PMI nondisplaced Abdomen -NT/ND, no HSM, no mass, + bowel sounds, no bruit 2+ femoral pulses, no bruits Ext-no edema, chords, 2+ DP Neuro-grossly nonfocal  EKG Interpretation Date/Time:  Monday February 11 2023 08:05:17 EDT Ventricular Rate:  68 PR Interval:  154 QRS Duration:  96 QT Interval:  408 QTC Calculation: 433 R Axis:   44  Text Interpretation: Normal sinus rhythm Normal ECG When compared with ECG of 06-Nov-2021 10:52, No significant change was found  Confirmed by Olga Millers ([id number]) on 02/11/2023 8:07:13 AM    A/P  1 paroxysmal atrial fibrillation-patient has recorded rhythm strips associated with his palpitations with his Alive[employer] monitor.  I have reviewed these and he has paroxysmal atrial fibrillation.  Will begin Toprol 25 mg daily for rate control.  Schedule echocardiogram to assess LV function.  Check TSH.  He has no embolic risk factors (there was a question of hypertension in the past but patient states he has to be on a blood pressure medication to keep his blood pressure very low but did not have hypertension).  We will therefore not anticoagulate.  Given the frequency of his events will refer to atrial fibrillation clinic for antiarrhythmic versus ablation.  He would be a good candidate for flecainide but given his age ablation may be the best option.  2 chest tightness-associated with palpitations.  However previous calcium score 0.  He also can run 5 to 6 miles at a time with no symptoms.  Will not pursue further ischemia evaluation at this point.  3 hyperlipidemia-continue diet.  4 snoring-we will arrange sleep study to exclude sleep apnea.  Olga Millers, MD

## 2023-02-11 ENCOUNTER — Encounter: Payer: Self-pay | Admitting: Cardiology

## 2023-02-11 ENCOUNTER — Ambulatory Visit: Payer: Commercial Managed Care - HMO | Attending: Cardiology | Admitting: Cardiology

## 2023-02-11 VITALS — BP 126/68 | HR 68 | Ht 74.0 in | Wt 208.2 lb

## 2023-02-11 DIAGNOSIS — I480 Paroxysmal atrial fibrillation: Secondary | ICD-10-CM | POA: Diagnosis not present

## 2023-02-11 DIAGNOSIS — R002 Palpitations: Secondary | ICD-10-CM | POA: Diagnosis not present

## 2023-02-11 DIAGNOSIS — E7800 Pure hypercholesterolemia, unspecified: Secondary | ICD-10-CM

## 2023-02-11 LAB — TSH: TSH: 1.86 u[IU]/mL (ref 0.450–4.500)

## 2023-02-11 MED ORDER — METOPROLOL SUCCINATE ER 25 MG PO TB24: 25.0000 mg | ORAL_TABLET | Freq: Every day | ORAL | 1 refills | Status: DC

## 2023-02-11 NOTE — Patient Instructions (Signed)
Medication Instructions:  Begin Toprol 25mg . Take one tablet at night before bedtime. *If you need a refill on your cardiac medications before your next appointment, please call your pharmacy*   Lab Work: TSH If you have labs (blood work) drawn today and your tests are completely normal, you will receive your results only by: MyChart Message (if you have MyChart) OR A paper copy in the mail If you have any lab test that is abnormal or we need to change your treatment, we will call you to review the results.   Testing/Procedures: Your physician has requested that you have an echocardiogram. Echocardiography is a painless test that uses sound waves to create images of your heart. It provides your doctor with information about the size and shape of your heart and how well your heart's chambers and valves are working. This procedure takes approximately one hour. There are no restrictions for this procedure. Please do NOT wear cologne, perfume, aftershave, or lotions (deodorant is allowed). Please arrive 15 minutes prior to your appointment time.   Your physician has recommended that you have a sleep study. This test records several body functions during sleep, including: brain activity, eye movement, oxygen and carbon dioxide blood levels, heart rate and rhythm, breathing rate and rhythm, the flow of air through your mouth and nose, snoring, body muscle movements, and chest and belly movement.    Follow-Up: At [facility], you and your health needs are our priority.  As part of our continuing mission to provide you with exceptional heart care, we have created designated Provider Care Teams.  These Care Teams include your primary Cardiologist (physician) and Advanced Practice Providers (APPs -  Physician Assistants and Nurse Practitioners) who all work together to provide you with the care you need, when you need it.  We recommend signing up for the patient portal called "MyChart".  Sign  up information is provided on this After Visit Summary.  MyChart is used to connect with patients for Virtual Visits (Telemedicine).  Patients are able to view lab/test results, encounter notes, upcoming appointments, etc.  Non-urgent messages can be sent to your provider as well.   To learn more about what you can do with MyChart, go to [email or web address].    Your next appointment:   3 month(s)  Provider:   Dr. Jens Som

## 2023-02-26 ENCOUNTER — Ambulatory Visit (HOSPITAL_COMMUNITY)
Admission: RE | Admit: 2023-02-26 | Discharge: 2023-02-26 | Disposition: A | Discharge status: Home / Self Care | Payer: Commercial Managed Care - HMO | Source: Ambulatory Visit | Attending: Internal Medicine | Admitting: Internal Medicine

## 2023-02-26 VITALS — BP 150/90 | HR 70 | Ht 74.0 in | Wt 209.6 lb

## 2023-02-26 DIAGNOSIS — I480 Paroxysmal atrial fibrillation: Secondary | ICD-10-CM

## 2023-02-26 DIAGNOSIS — I4891 Unspecified atrial fibrillation: Secondary | ICD-10-CM

## 2023-02-26 NOTE — Progress Notes (Signed)
Primary Care Physician: Tresa Garter, MD Primary Cardiologist: None Electrophysiologist: None     Referring Physician: Dr. Gennaro Africa [name] is a 57 y.o. male with a history of paroxysmal atrial fibrillation who presents for consultation in the [facility]. Cardiact CT 11/2020 with coronary calcium score of 0. Seen by Dr. Jens Som on 02/11/23 and diagnosed with paroxysmal Afib based on monitor readings. Started on Toprol 25 mg daily. He is scheduled for a sleep study and an echocardiogram. Kardia mobile strips sent to Dr. Jens Som in system appear to be consistent with Afib. Patient is not on anticoagulation. He has a CHADS2VASC score of zero.  On evaluation today, he is currently in NSR. I am able to review Kardiamobile strips on 8/22 and 8/23 in the evening that do appear consistent with Afib. He notes the last two episodes he had felt very scary in terms of symptoms. He can feel irregular tachycardia with episodes. He denies history of HTN - he states his BP at home runs 120/70s. He does admit to caffeine use and drinking an alcoholic beverage daily.   Today, he denies symptoms of chest pain, shortness of breath, orthopnea, PND, lower extremity edema, dizziness, presyncope, syncope, snoring, daytime somnolence, bleeding, or neurologic sequela. The patient is tolerating medications without difficulties and is otherwise without complaint today.    he has a BMI of Body mass index is 26.91 kg/m.Marland Kitchen Filed Weights   02/26/23 1310  Weight: 95.1 kg    Current Outpatient Medications  Medication Sig Dispense Refill   ALPRAZolam (XANAX) 0.25 MG tablet Take 1 tablet (0.25 mg total) by mouth 2 (two) times daily as needed for anxiety (Panic attacks). 60 tablet 1   aspirin-acetaminophen-caffeine (EXCEDRIN MIGRAINE) 250-250-65 MG tablet Take 1 tablet by mouth as needed for headache.     celecoxib (CELEBREX) 200 MG capsule Take 200 mg by mouth as needed.      citalopram (CELEXA) 20 MG tablet Take 1/2 (one-half) tablet by mouth once daily 30 tablet 5   diclofenac Sodium (VOLTAREN) 1 % GEL Apply 2 g topically as needed.     LUMIFY 0.025 % SOLN SMARTSIG:1 Drop(s) In Eye(s) PRN     mesalamine (CANASA) 1000 MG suppository Place 1 suppository (1,000 mg total) rectally at bedtime. (Patient taking differently: Place 1,000 mg rectally as needed.) 30 suppository 0   omeprazole (PRILOSEC) 40 MG capsule Take 40 mg by mouth daily.     ondansetron (ZOFRAN-ODT) 4 MG disintegrating tablet Take 4 mg by mouth every 8 (eight) hours as needed.     pantoprazole (PROTONIX) 40 MG tablet Take 1 tablet (40 mg total) by mouth 2 (two) times daily before a meal. 60 tablet 11   Semaglutide-Weight Management (WEGOVY [place]) Inject 0.125 mg into the skin once a week. Compound version-     SUMAtriptan (IMITREX) 20 MG/ACT nasal spray Place 1 spray (20 mg total) into the nose every 2 (two) hours as needed for migraine or headache. May repeat in 2 hours if headache persists or recurs. 1 each 5   valACYclovir (VALTREX) 500 MG tablet TAKE 1 TABLET BY MOUTH TWICE DAILY AS NEEDED FOR FEVER BLISTERS FOR 5 DAYS 30 tablet 0   zolmitriptan (ZOMIG) 5 MG tablet Take 1 tablet (5 mg total) by mouth as needed for migraine. 10 tablet 3   metoprolol succinate (TOPROL XL) 25 MG 24 hr tablet Take 1 tablet (25 mg total) by mouth daily. Take at bedtime. (  Patient not taking: Reported on 02/26/2023) 90 tablet 1   No current facility-administered medications for this encounter.    Atrial Fibrillation Management history:  Previous antiarrhythmic drugs: None Previous cardioversions: None Previous ablations: None Anticoagulation history: None   ROS- All systems are reviewed and negative except as per the HPI above.  Physical Exam: BP (!) 150/90   Pulse 70   Ht 6\' 2"  (1.88 m)   Wt 95.1 kg   BMI 26.91 kg/m   GEN: Well nourished, well developed in no acute distress NECK: No JVD; No carotid  bruits CARDIAC: Regular rate and rhythm, no murmurs, rubs, gallops RESPIRATORY:  Clear to auscultation without rales, wheezing or rhonchi  ABDOMEN: Soft, non-tender, non-distended EXTREMITIES:  No edema; No deformity   EKG today demonstrates  Vent. rate 70 BPM PR interval 144 ms QRS duration 96 ms QT/QTcB 392/423 ms P-R-T axes -11 -18 2 Normal sinus rhythm Minimal voltage criteria for LVH, may be normal variant ( R in aVL ) Borderline ECG When compared with ECG of 11-Feb-2023 08:05, PREVIOUS ECG IS PRESENT  Echo scheduled for 9/16.   ASSESSMENT & PLAN CHA2DS2-VASc Score = 0  The patient's score is based upon: CHF History: 0 HTN History: 0 Diabetes History: 0 Stroke History: 0 Vascular Disease History: 0 Age Score: 0 Gender Score: 0       ASSESSMENT AND PLAN: Paroxysmal Atrial Fibrillation (ICD10:  I48.0) The patient's CHA2DS2-VASc score is 0, indicating a 0.2% annual risk of stroke.    He is currently in NSR.   Education provided about Afib. Discussion about medication treatments and ablation going forward if indicated. After discussion, we will place a cardiac monitor after his echocardiogram to determine burden. He does not meet criteria for anticoagulation since patient denies history of HTN. He would be interested in pursuing discussion with EP regarding ablation pending burden from monitor. He has pending echocardiogram and referral for sleep study made. He has Kardiamobile device for monitoring at home. Discussion on triggers including excessive caffeine and alcohol intake.     Follow up will be based on monitor results.    [name], PA-C  [facility] [address] [place], [place] [address] [phone]

## 2023-03-01 ENCOUNTER — Encounter (HOSPITAL_COMMUNITY): Payer: Self-pay

## 2023-03-04 ENCOUNTER — Ambulatory Visit (HOSPITAL_COMMUNITY)
Admission: RE | Admit: 2023-03-04 | Discharge: 2023-03-04 | Disposition: A | Discharge status: Home / Self Care | Payer: Commercial Managed Care - HMO | Source: Ambulatory Visit | Attending: Cardiology | Admitting: Internal Medicine

## 2023-03-04 ENCOUNTER — Other Ambulatory Visit (HOSPITAL_COMMUNITY): Payer: Self-pay | Admitting: Internal Medicine

## 2023-03-04 ENCOUNTER — Ambulatory Visit (HOSPITAL_BASED_OUTPATIENT_CLINIC_OR_DEPARTMENT_OTHER): Payer: Commercial Managed Care - HMO

## 2023-03-04 ENCOUNTER — Inpatient Hospital Stay (HOSPITAL_COMMUNITY)
Admission: RE | Admit: 2023-03-04 | Discharge: 2023-03-04 | Disposition: A | Discharge status: Home / Self Care | Payer: Commercial Managed Care - HMO | Source: Ambulatory Visit | Attending: Internal Medicine | Admitting: Internal Medicine

## 2023-03-04 DIAGNOSIS — I4891 Unspecified atrial fibrillation: Secondary | ICD-10-CM

## 2023-03-04 DIAGNOSIS — I480 Paroxysmal atrial fibrillation: Secondary | ICD-10-CM | POA: Diagnosis present

## 2023-03-04 DIAGNOSIS — I5030 Unspecified diastolic (congestive) heart failure: Secondary | ICD-10-CM | POA: Diagnosis not present

## 2023-03-04 DIAGNOSIS — I082 Rheumatic disorders of both aortic and tricuspid valves: Secondary | ICD-10-CM | POA: Diagnosis not present

## 2023-03-04 LAB — ECHOCARDIOGRAM COMPLETE
Area-P 1/2: 3.24 cm2
P 1/2 time: 502 ms
S' Lateral: 3 cm

## 2023-03-22 ENCOUNTER — Encounter (HOSPITAL_COMMUNITY): Payer: Self-pay

## 2023-04-01 ENCOUNTER — Encounter: Payer: Commercial Managed Care - HMO | Admitting: Internal Medicine

## 2023-04-01 ENCOUNTER — Encounter: Payer: Self-pay | Admitting: Internal Medicine

## 2023-04-03 ENCOUNTER — Other Ambulatory Visit: Payer: Self-pay | Admitting: Internal Medicine

## 2023-04-03 MED ORDER — AZITHROMYCIN 250 MG PO TABS: ORAL_TABLET | ORAL | 0 refills | Status: DC

## 2023-04-15 ENCOUNTER — Ambulatory Visit (INDEPENDENT_AMBULATORY_CARE_PROVIDER_SITE_OTHER): Payer: Managed Care, Other (non HMO) | Admitting: Internal Medicine

## 2023-04-15 ENCOUNTER — Encounter: Payer: Self-pay | Admitting: Internal Medicine

## 2023-04-15 VITALS — BP 116/74 | HR 70 | Temp 98.2°F | Ht 74.0 in | Wt 207.0 lb

## 2023-04-15 DIAGNOSIS — Z0000 Encounter for general adult medical examination without abnormal findings: Secondary | ICD-10-CM | POA: Diagnosis not present

## 2023-04-15 DIAGNOSIS — E559 Vitamin D deficiency, unspecified: Secondary | ICD-10-CM | POA: Diagnosis not present

## 2023-04-15 DIAGNOSIS — I10 Essential (primary) hypertension: Secondary | ICD-10-CM

## 2023-04-15 DIAGNOSIS — G8929 Other chronic pain: Secondary | ICD-10-CM

## 2023-04-15 DIAGNOSIS — I480 Paroxysmal atrial fibrillation: Secondary | ICD-10-CM

## 2023-04-15 DIAGNOSIS — R739 Hyperglycemia, unspecified: Secondary | ICD-10-CM | POA: Diagnosis not present

## 2023-04-15 DIAGNOSIS — M545 Low back pain, unspecified: Secondary | ICD-10-CM

## 2023-04-15 LAB — CBC WITH DIFFERENTIAL/PLATELET
Basophils Absolute: 0 10*3/uL (ref 0.0–0.1)
Basophils Relative: 0.3 % (ref 0.0–3.0)
Eosinophils Absolute: 0.1 10*3/uL (ref 0.0–0.7)
Eosinophils Relative: 1.7 % (ref 0.0–5.0)
HCT: 45 % (ref 39.0–52.0)
Hemoglobin: 15.3 g/dL (ref 13.0–17.0)
Lymphocytes Relative: 32.4 % (ref 12.0–46.0)
Lymphs Abs: 1.8 10*3/uL (ref 0.7–4.0)
MCHC: 34 g/dL (ref 30.0–36.0)
MCV: 92 fL (ref 78.0–100.0)
Monocytes Absolute: 0.4 10*3/uL (ref 0.1–1.0)
Monocytes Relative: 7.7 % (ref 3.0–12.0)
Neutro Abs: 3.2 10*3/uL (ref 1.4–7.7)
Neutrophils Relative %: 57.9 % (ref 43.0–77.0)
Platelets: 256 10*3/uL (ref 150.0–400.0)
RBC: 4.89 Mil/uL (ref 4.22–5.81)
RDW: 13.1 % (ref 11.5–15.5)
WBC: 5.6 10*3/uL (ref 4.0–10.5)

## 2023-04-15 LAB — URINALYSIS
Bilirubin Urine: NEGATIVE
Hgb urine dipstick: NEGATIVE
Ketones, ur: NEGATIVE
Leukocytes,Ua: NEGATIVE
Nitrite: NEGATIVE
Specific Gravity, Urine: 1.015 (ref 1.000–1.030)
Total Protein, Urine: NEGATIVE
Urine Glucose: NEGATIVE
Urobilinogen, UA: 0.2 (ref 0.0–1.0)
pH: 6.5 (ref 5.0–8.0)

## 2023-04-15 LAB — VITAMIN D 25 HYDROXY (VIT D DEFICIENCY, FRACTURES): VITD: 41.18 ng/mL (ref 30.00–100.00)

## 2023-04-15 LAB — COMPREHENSIVE METABOLIC PANEL
ALT: 16 U/L (ref 0–53)
AST: 20 U/L (ref 0–37)
Albumin: 4.3 g/dL (ref 3.5–5.2)
Alkaline Phosphatase: 52 U/L (ref 39–117)
BUN: 10 mg/dL (ref 6–23)
CO2: 31 meq/L (ref 19–32)
Calcium: 9.6 mg/dL (ref 8.4–10.5)
Chloride: 102 meq/L (ref 96–112)
Creatinine, Ser: 0.92 mg/dL (ref 0.40–1.50)
GFR: 91.93 mL/min (ref >60.00)
Glucose, Bld: 75 mg/dL (ref 70–99)
Potassium: 4.5 meq/L (ref 3.5–5.1)
Sodium: 139 meq/L (ref 135–145)
Total Bilirubin: 0.9 mg/dL (ref 0.2–1.2)
Total Protein: 6.8 g/dL (ref 6.0–8.3)

## 2023-04-15 LAB — LIPID PANEL
Cholesterol: 221 mg/dL — ABNORMAL HIGH (ref 0–200)
HDL: 56.4 mg/dL (ref >39.00)
LDL Cholesterol: 146 mg/dL — ABNORMAL HIGH (ref 0–99)
NonHDL: 164.22
Total CHOL/HDL Ratio: 4
Triglycerides: 89 mg/dL (ref 0.0–149.0)
VLDL: 17.8 mg/dL (ref 0.0–40.0)

## 2023-04-15 LAB — VITAMIN B12: Vitamin B-12: 344 pg/mL (ref 211–911)

## 2023-04-15 LAB — TSH: TSH: 1.13 u[IU]/mL (ref 0.35–5.50)

## 2023-04-15 LAB — HEMOGLOBIN A1C: Hgb A1c MFr Bld: 5.2 % (ref 4.6–6.5)

## 2023-04-15 LAB — PSA: PSA: 1.56 ng/mL (ref 0.10–4.00)

## 2023-04-15 NOTE — Assessment & Plan Note (Signed)
Continue with Diovan.  Weight loss effort.  No added salt diet

## 2023-04-15 NOTE — Assessment & Plan Note (Signed)
[facility] Seeing Dr Ethelene Hal

## 2023-04-15 NOTE — Assessment & Plan Note (Signed)
On Vit D 

## 2023-04-15 NOTE — Progress Notes (Signed)
Wt Readings from Last 3 Encounters:  04/15/23 207 lb (93.9 kg)  02/26/23 209 lb 9.6 oz (95.1 kg)  02/11/23 208 lb 3.2 oz (94.4 kg)    Physical Exam Constitutional:      General: He is not in acute distress.    Appearance: Normal appearance. He is well-developed.     Comments: NAD  Eyes:     Conjunctiva/sclera: Conjunctivae normal.     Pupils: Pupils are equal, round, and reactive to light.  Neck:     Thyroid: No thyromegaly.     Vascular: No JVD.  Cardiovascular:     Rate and Rhythm: Normal rate and regular rhythm.     Heart sounds: Normal heart sounds. No murmur heard.    No friction rub. No gallop.   Pulmonary:     Effort: Pulmonary effort is normal. No respiratory distress.     Breath sounds: Normal breath sounds. No wheezing or rales.  Chest:     Chest wall: No tenderness.  Abdominal:     General: Bowel sounds are normal. There is no distension.     Palpations: Abdomen is soft. There is no mass.     Tenderness: There is no abdominal tenderness. There is no guarding or rebound.  Musculoskeletal:        General: No tenderness. Normal range of motion.     Cervical back: Normal range of motion.  Lymphadenopathy:     Cervical: No cervical adenopathy.  Skin:    General: Skin is warm and dry.     Findings: No rash.  Neurological:     Mental Status: He is alert and oriented to person, place, and time.     Cranial Nerves: No cranial nerve deficit.     Motor: No abnormal muscle tone.     Coordination: Coordination normal.     Gait: Gait normal.     Deep Tendon Reflexes: Reflexes are normal and symmetric.  Psychiatric:        Behavior: Behavior normal.        Thought Content: Thought content normal.        Judgment: Judgment normal.   Rectal per GI  Lab Results  Component Value Date   WBC 5.6 03/26/2022   HGB 15.4 03/26/2022   HCT 45.2 03/26/2022   PLT 229.0 03/26/2022   GLUCOSE 98 03/26/2022   CHOL 212 (H) 03/26/2022   TRIG 54.0 03/26/2022   HDL 62.20 03/26/2022   LDLDIRECT 150.6 06/29/2013   LDLCALC 139 (H) 03/26/2022   ALT 16 03/26/2022   AST 19 03/26/2022   NA 138 03/26/2022   K 4.1 03/26/2022   CL 101 03/26/2022   CREATININE 0.90 03/26/2022   BUN 14 03/26/2022   CO2 28 03/26/2022   TSH 1.860 02/11/2023   PSA 1.59 03/26/2022   HGBA1C 5.6 03/26/2022    ECHOCARDIOGRAM COMPLETE  Result Date: 03/04/2023    ECHOCARDIOGRAM REPORT   Patient Name:   Glen [name] Date of Exam: 03/04/2023 Medical Rec #:  [id number]      Height:       74.0 in Accession #:    [id number]     Weight:       209.6 lb Date of Birth:  1965-11-30       BSA:          2.217 m Patient Age:    57  years       BP:           126/68 mmHg Patient  Wt Readings from Last 3 Encounters:  04/15/23 207 lb (93.9 kg)  02/26/23 209 lb 9.6 oz (95.1 kg)  02/11/23 208 lb 3.2 oz (94.4 kg)    Physical Exam Constitutional:      General: He is not in acute distress.    Appearance: Normal appearance. He is well-developed.     Comments: NAD  Eyes:     Conjunctiva/sclera: Conjunctivae normal.     Pupils: Pupils are equal, round, and reactive to light.  Neck:     Thyroid: No thyromegaly.     Vascular: No JVD.  Cardiovascular:     Rate and Rhythm: Normal rate and regular rhythm.     Heart sounds: Normal heart sounds. No murmur heard.    No friction rub. No gallop.   Pulmonary:     Effort: Pulmonary effort is normal. No respiratory distress.     Breath sounds: Normal breath sounds. No wheezing or rales.  Chest:     Chest wall: No tenderness.  Abdominal:     General: Bowel sounds are normal. There is no distension.     Palpations: Abdomen is soft. There is no mass.     Tenderness: There is no abdominal tenderness. There is no guarding or rebound.  Musculoskeletal:        General: No tenderness. Normal range of motion.     Cervical back: Normal range of motion.  Lymphadenopathy:     Cervical: No cervical adenopathy.  Skin:    General: Skin is warm and dry.     Findings: No rash.  Neurological:     Mental Status: He is alert and oriented to person, place, and time.     Cranial Nerves: No cranial nerve deficit.     Motor: No abnormal muscle tone.     Coordination: Coordination normal.     Gait: Gait normal.     Deep Tendon Reflexes: Reflexes are normal and symmetric.  Psychiatric:        Behavior: Behavior normal.        Thought Content: Thought content normal.        Judgment: Judgment normal.   Rectal per GI  Lab Results  Component Value Date   WBC 5.6 03/26/2022   HGB 15.4 03/26/2022   HCT 45.2 03/26/2022   PLT 229.0 03/26/2022   GLUCOSE 98 03/26/2022   CHOL 212 (H) 03/26/2022   TRIG 54.0 03/26/2022   HDL 62.20 03/26/2022   LDLDIRECT 150.6 06/29/2013   LDLCALC 139 (H) 03/26/2022   ALT 16 03/26/2022   AST 19 03/26/2022   NA 138 03/26/2022   K 4.1 03/26/2022   CL 101 03/26/2022   CREATININE 0.90 03/26/2022   BUN 14 03/26/2022   CO2 28 03/26/2022   TSH 1.860 02/11/2023   PSA 1.59 03/26/2022   HGBA1C 5.6 03/26/2022    ECHOCARDIOGRAM COMPLETE  Result Date: 03/04/2023    ECHOCARDIOGRAM REPORT   Patient Name:   Glen [name] Date of Exam: 03/04/2023 Medical Rec #:  [id number]      Height:       74.0 in Accession #:    [id number]     Weight:       209.6 lb Date of Birth:  1965-11-30       BSA:          2.217 m Patient Age:    57  years       BP:           126/68 mmHg Patient  Subjective:  Patient ID: DEAKYN STOLZENBURG, male    DOB: 14-Mar-1966  Age: 57 y.o. MRN: [id number]  CC: Annual Exam   HPI Glen [name] presents for a well exam, PAF, elevated BP On Ozempic compound low dose  Outpatient Medications Prior to Visit  Medication Sig Dispense Refill   ALPRAZolam (XANAX) 0.25 MG tablet Take 1 tablet (0.25 mg total) by mouth 2 (two) times daily as needed for anxiety (Panic attacks). 60 tablet 1   aspirin-acetaminophen-caffeine (EXCEDRIN MIGRAINE) 250-250-65 MG tablet Take 1 tablet by mouth as needed for headache.     azithromycin (ZITHROMAX Z-PAK) 250 MG tablet As directed 6 each 0   celecoxib (CELEBREX) 200 MG capsule Take 200 mg by mouth as needed.     citalopram (CELEXA) 20 MG tablet Take 1/2 (one-half) tablet by mouth once daily 30 tablet 5   diclofenac Sodium (VOLTAREN) 1 % GEL Apply 2 g topically as needed.     LUMIFY 0.025 % SOLN SMARTSIG:1 Drop(s) In Eye(s) PRN     mesalamine (CANASA) 1000 MG suppository Place 1 suppository (1,000 mg total) rectally at bedtime. (Patient taking differently: Place 1,000 mg rectally as needed.) 30 suppository 0   metoprolol succinate (TOPROL XL) 25 MG 24 hr tablet Take 1 tablet (25 mg total) by mouth daily. Take at bedtime. 90 tablet 1   omeprazole (PRILOSEC) 40 MG capsule Take 40 mg by mouth daily.     ondansetron (ZOFRAN-ODT) 4 MG disintegrating tablet Take 4 mg by mouth every 8 (eight) hours as needed.     pantoprazole (PROTONIX) 40 MG tablet Take 1 tablet (40 mg total) by mouth 2 (two) times daily before a meal. 60 tablet 11   Semaglutide-Weight Management (WEGOVY [place]) Inject 0.125 mg into the skin once a week. Compound version-     SUMAtriptan (IMITREX) 20 MG/ACT nasal spray Place 1 spray (20 mg total) into the nose every 2 (two) hours as needed for migraine or headache. May repeat in 2 hours if headache persists or recurs. 1 each 5   valACYclovir (VALTREX) 500 MG tablet TAKE 1 TABLET BY MOUTH TWICE DAILY AS NEEDED FOR  FEVER BLISTERS FOR 5 DAYS 30 tablet 0   zolmitriptan (ZOMIG) 5 MG tablet Take 1 tablet (5 mg total) by mouth as needed for migraine. 10 tablet 3   No facility-administered medications prior to visit.    ROS: Review of Systems  Constitutional:  Negative for appetite change, fatigue and unexpected weight change.  HENT:  Negative for congestion, nosebleeds, sneezing, sore throat and trouble swallowing.   Eyes:  Negative for itching and visual disturbance.  Respiratory:  Negative for cough.   Cardiovascular:  Negative for chest pain, palpitations and leg swelling.  Gastrointestinal:  Negative for abdominal distention, blood in stool, diarrhea and nausea.  Genitourinary:  Negative for frequency and hematuria.  Musculoskeletal:  Positive for back pain. Negative for gait problem, joint swelling and neck pain.  Skin:  Negative for rash.  Neurological:  Negative for dizziness, tremors, speech difficulty and weakness.  Psychiatric/Behavioral:  Negative for agitation, dysphoric mood and sleep disturbance. The patient is not nervous/anxious.     Objective:  BP 116/74   Pulse 70   Temp 98.2 F (36.8 C) (Temporal)   Ht 6\' 2"  (1.88 m)   Wt 207 lb (93.9 kg)   SpO2 98%   BMI 26.58 kg/m   BP Readings from Last 3 Encounters:  04/15/23 116/74  02/26/23 (!) 150/90  02/11/23 126/68  Subjective:  Patient ID: DEAKYN STOLZENBURG, male    DOB: 14-Mar-1966  Age: 57 y.o. MRN: [id number]  CC: Annual Exam   HPI Glen [name] presents for a well exam, PAF, elevated BP On Ozempic compound low dose  Outpatient Medications Prior to Visit  Medication Sig Dispense Refill   ALPRAZolam (XANAX) 0.25 MG tablet Take 1 tablet (0.25 mg total) by mouth 2 (two) times daily as needed for anxiety (Panic attacks). 60 tablet 1   aspirin-acetaminophen-caffeine (EXCEDRIN MIGRAINE) 250-250-65 MG tablet Take 1 tablet by mouth as needed for headache.     azithromycin (ZITHROMAX Z-PAK) 250 MG tablet As directed 6 each 0   celecoxib (CELEBREX) 200 MG capsule Take 200 mg by mouth as needed.     citalopram (CELEXA) 20 MG tablet Take 1/2 (one-half) tablet by mouth once daily 30 tablet 5   diclofenac Sodium (VOLTAREN) 1 % GEL Apply 2 g topically as needed.     LUMIFY 0.025 % SOLN SMARTSIG:1 Drop(s) In Eye(s) PRN     mesalamine (CANASA) 1000 MG suppository Place 1 suppository (1,000 mg total) rectally at bedtime. (Patient taking differently: Place 1,000 mg rectally as needed.) 30 suppository 0   metoprolol succinate (TOPROL XL) 25 MG 24 hr tablet Take 1 tablet (25 mg total) by mouth daily. Take at bedtime. 90 tablet 1   omeprazole (PRILOSEC) 40 MG capsule Take 40 mg by mouth daily.     ondansetron (ZOFRAN-ODT) 4 MG disintegrating tablet Take 4 mg by mouth every 8 (eight) hours as needed.     pantoprazole (PROTONIX) 40 MG tablet Take 1 tablet (40 mg total) by mouth 2 (two) times daily before a meal. 60 tablet 11   Semaglutide-Weight Management (WEGOVY [place]) Inject 0.125 mg into the skin once a week. Compound version-     SUMAtriptan (IMITREX) 20 MG/ACT nasal spray Place 1 spray (20 mg total) into the nose every 2 (two) hours as needed for migraine or headache. May repeat in 2 hours if headache persists or recurs. 1 each 5   valACYclovir (VALTREX) 500 MG tablet TAKE 1 TABLET BY MOUTH TWICE DAILY AS NEEDED FOR  FEVER BLISTERS FOR 5 DAYS 30 tablet 0   zolmitriptan (ZOMIG) 5 MG tablet Take 1 tablet (5 mg total) by mouth as needed for migraine. 10 tablet 3   No facility-administered medications prior to visit.    ROS: Review of Systems  Constitutional:  Negative for appetite change, fatigue and unexpected weight change.  HENT:  Negative for congestion, nosebleeds, sneezing, sore throat and trouble swallowing.   Eyes:  Negative for itching and visual disturbance.  Respiratory:  Negative for cough.   Cardiovascular:  Negative for chest pain, palpitations and leg swelling.  Gastrointestinal:  Negative for abdominal distention, blood in stool, diarrhea and nausea.  Genitourinary:  Negative for frequency and hematuria.  Musculoskeletal:  Positive for back pain. Negative for gait problem, joint swelling and neck pain.  Skin:  Negative for rash.  Neurological:  Negative for dizziness, tremors, speech difficulty and weakness.  Psychiatric/Behavioral:  Negative for agitation, dysphoric mood and sleep disturbance. The patient is not nervous/anxious.     Objective:  BP 116/74   Pulse 70   Temp 98.2 F (36.8 C) (Temporal)   Ht 6\' 2"  (1.88 m)   Wt 207 lb (93.9 kg)   SpO2 98%   BMI 26.58 kg/m   BP Readings from Last 3 Encounters:  04/15/23 116/74  02/26/23 (!) 150/90  02/11/23 126/68  Subjective:  Patient ID: DEAKYN STOLZENBURG, male    DOB: 14-Mar-1966  Age: 57 y.o. MRN: [id number]  CC: Annual Exam   HPI Glen [name] presents for a well exam, PAF, elevated BP On Ozempic compound low dose  Outpatient Medications Prior to Visit  Medication Sig Dispense Refill   ALPRAZolam (XANAX) 0.25 MG tablet Take 1 tablet (0.25 mg total) by mouth 2 (two) times daily as needed for anxiety (Panic attacks). 60 tablet 1   aspirin-acetaminophen-caffeine (EXCEDRIN MIGRAINE) 250-250-65 MG tablet Take 1 tablet by mouth as needed for headache.     azithromycin (ZITHROMAX Z-PAK) 250 MG tablet As directed 6 each 0   celecoxib (CELEBREX) 200 MG capsule Take 200 mg by mouth as needed.     citalopram (CELEXA) 20 MG tablet Take 1/2 (one-half) tablet by mouth once daily 30 tablet 5   diclofenac Sodium (VOLTAREN) 1 % GEL Apply 2 g topically as needed.     LUMIFY 0.025 % SOLN SMARTSIG:1 Drop(s) In Eye(s) PRN     mesalamine (CANASA) 1000 MG suppository Place 1 suppository (1,000 mg total) rectally at bedtime. (Patient taking differently: Place 1,000 mg rectally as needed.) 30 suppository 0   metoprolol succinate (TOPROL XL) 25 MG 24 hr tablet Take 1 tablet (25 mg total) by mouth daily. Take at bedtime. 90 tablet 1   omeprazole (PRILOSEC) 40 MG capsule Take 40 mg by mouth daily.     ondansetron (ZOFRAN-ODT) 4 MG disintegrating tablet Take 4 mg by mouth every 8 (eight) hours as needed.     pantoprazole (PROTONIX) 40 MG tablet Take 1 tablet (40 mg total) by mouth 2 (two) times daily before a meal. 60 tablet 11   Semaglutide-Weight Management (WEGOVY [place]) Inject 0.125 mg into the skin once a week. Compound version-     SUMAtriptan (IMITREX) 20 MG/ACT nasal spray Place 1 spray (20 mg total) into the nose every 2 (two) hours as needed for migraine or headache. May repeat in 2 hours if headache persists or recurs. 1 each 5   valACYclovir (VALTREX) 500 MG tablet TAKE 1 TABLET BY MOUTH TWICE DAILY AS NEEDED FOR  FEVER BLISTERS FOR 5 DAYS 30 tablet 0   zolmitriptan (ZOMIG) 5 MG tablet Take 1 tablet (5 mg total) by mouth as needed for migraine. 10 tablet 3   No facility-administered medications prior to visit.    ROS: Review of Systems  Constitutional:  Negative for appetite change, fatigue and unexpected weight change.  HENT:  Negative for congestion, nosebleeds, sneezing, sore throat and trouble swallowing.   Eyes:  Negative for itching and visual disturbance.  Respiratory:  Negative for cough.   Cardiovascular:  Negative for chest pain, palpitations and leg swelling.  Gastrointestinal:  Negative for abdominal distention, blood in stool, diarrhea and nausea.  Genitourinary:  Negative for frequency and hematuria.  Musculoskeletal:  Positive for back pain. Negative for gait problem, joint swelling and neck pain.  Skin:  Negative for rash.  Neurological:  Negative for dizziness, tremors, speech difficulty and weakness.  Psychiatric/Behavioral:  Negative for agitation, dysphoric mood and sleep disturbance. The patient is not nervous/anxious.     Objective:  BP 116/74   Pulse 70   Temp 98.2 F (36.8 C) (Temporal)   Ht 6\' 2"  (1.88 m)   Wt 207 lb (93.9 kg)   SpO2 98%   BMI 26.58 kg/m   BP Readings from Last 3 Encounters:  04/15/23 116/74  02/26/23 (!) 150/90  02/11/23 126/68  Wt Readings from Last 3 Encounters:  04/15/23 207 lb (93.9 kg)  02/26/23 209 lb 9.6 oz (95.1 kg)  02/11/23 208 lb 3.2 oz (94.4 kg)    Physical Exam Constitutional:      General: He is not in acute distress.    Appearance: Normal appearance. He is well-developed.     Comments: NAD  Eyes:     Conjunctiva/sclera: Conjunctivae normal.     Pupils: Pupils are equal, round, and reactive to light.  Neck:     Thyroid: No thyromegaly.     Vascular: No JVD.  Cardiovascular:     Rate and Rhythm: Normal rate and regular rhythm.     Heart sounds: Normal heart sounds. No murmur heard.    No friction rub. No gallop.   Pulmonary:     Effort: Pulmonary effort is normal. No respiratory distress.     Breath sounds: Normal breath sounds. No wheezing or rales.  Chest:     Chest wall: No tenderness.  Abdominal:     General: Bowel sounds are normal. There is no distension.     Palpations: Abdomen is soft. There is no mass.     Tenderness: There is no abdominal tenderness. There is no guarding or rebound.  Musculoskeletal:        General: No tenderness. Normal range of motion.     Cervical back: Normal range of motion.  Lymphadenopathy:     Cervical: No cervical adenopathy.  Skin:    General: Skin is warm and dry.     Findings: No rash.  Neurological:     Mental Status: He is alert and oriented to person, place, and time.     Cranial Nerves: No cranial nerve deficit.     Motor: No abnormal muscle tone.     Coordination: Coordination normal.     Gait: Gait normal.     Deep Tendon Reflexes: Reflexes are normal and symmetric.  Psychiatric:        Behavior: Behavior normal.        Thought Content: Thought content normal.        Judgment: Judgment normal.   Rectal per GI  Lab Results  Component Value Date   WBC 5.6 03/26/2022   HGB 15.4 03/26/2022   HCT 45.2 03/26/2022   PLT 229.0 03/26/2022   GLUCOSE 98 03/26/2022   CHOL 212 (H) 03/26/2022   TRIG 54.0 03/26/2022   HDL 62.20 03/26/2022   LDLDIRECT 150.6 06/29/2013   LDLCALC 139 (H) 03/26/2022   ALT 16 03/26/2022   AST 19 03/26/2022   NA 138 03/26/2022   K 4.1 03/26/2022   CL 101 03/26/2022   CREATININE 0.90 03/26/2022   BUN 14 03/26/2022   CO2 28 03/26/2022   TSH 1.860 02/11/2023   PSA 1.59 03/26/2022   HGBA1C 5.6 03/26/2022    ECHOCARDIOGRAM COMPLETE  Result Date: 03/04/2023    ECHOCARDIOGRAM REPORT   Patient Name:   Glen [name] Date of Exam: 03/04/2023 Medical Rec #:  [id number]      Height:       74.0 in Accession #:    [id number]     Weight:       209.6 lb Date of Birth:  1965-11-30       BSA:          2.217 m Patient Age:    57  years       BP:           126/68 mmHg Patient

## 2023-04-15 NOTE — Assessment & Plan Note (Addendum)
PAF Dr Jens Som On Metoprolol

## 2023-04-15 NOTE — Assessment & Plan Note (Signed)
On Ozempic compound low dose

## 2023-04-15 NOTE — Assessment & Plan Note (Signed)
  Colon per Dr Kinnie Scales - 2019 A cardiac CT scan for calcium scoring offered 9/20 Running up to 12 mi  We discussed age appropriate health related issues, including available/recomended screening tests and vaccinations. Labs were ordered to be later reviewed . All questions were answered. We discussed one or more of the following - seat belt use, use of sunscreen/sun exposure exercise, safe sex, fall risk reduction, second hand smoke exposure, firearm use and storage, seat belt use, a need for adhering to healthy diet and exercise. Labs were ordered.  All questions were answered.

## 2023-04-17 ENCOUNTER — Encounter: Payer: Commercial Managed Care - HMO | Admitting: Internal Medicine

## 2023-05-08 NOTE — Progress Notes (Signed)
Cardiology Clinic Note   Patient Name: Glen [name] Date of Encounter: 05/14/2023  Primary Care Provider:  Tresa Garter, MD Primary Cardiologist:  None  Patient Profile    Glen [name] 57 year old male presents to the clinic today for follow-up evaluation of his paroxysmal atrial fibrillation and palpitations.  Past Medical History    Past Medical History:  Diagnosis Date   Allergic rhinitis    Anxiety    Arthritis    Asthmatic bronchitis    Barrett esophagus    Dry eyes    GERD (gastroesophageal reflux disease)    Headache    "prone to migraines"   Hepatitis B infection 2008   recovered   Hx of adenomatous polyp of colon 2012   Hypertension    Hypothyroidism 2010   Dr. Raquel Sarna, been tested since and this is an inacurrate diagnosis   Internal hemorrhoids    MRSA infection    (15 years ago, left neck area)   PONV (postoperative nausea and vomiting)    Ulcerative proctitis (HCC)    Urethritis 2010   Vitamin D deficiency    Past Surgical History:  Procedure Laterality Date   ANTERIOR CERVICAL DECOMP/DISCECTOMY FUSION N/A 08/31/2020   Procedure: ANTERIOR CERVICAL DECOMPRESSION/DISCECTOMY FUSION, INTERBODY PROSTHESIS, PLATE/SCREWS CERVICAL FIVE-SIX, CERVICAL SIX-SEVEN;  Surgeon: Tressie Stalker, MD;  Location: [facility] OR;  Service: Neurosurgery;  Laterality: N/A;  ANTERIOR CERVICAL DECOMPRESSION/DISCECTOMY FUSION, INTERBODY PROSTHESIS, PLATE/SCREWS CERVICAL FIVE-SIX, CERVICAL SIX-SEVEN   COLONOSCOPY     multiple - adenoma 2012   ESOPHAGOGASTRODUODENOSCOPY     multiple - barrett's   FACIAL COSMETIC SURGERY     HEMORRHOID BANDING     [place]   LIPOSUCTION     TEAR DUCT PROBING  2009   UMBILICAL HERNIA REPAIR N/A 11/09/2021   Procedure: OPEN UMBILICAL HERNIA REPAIR WITH MESH;  Surgeon: Abigail Miyamoto, MD;  Location: [facility];  Service: General;  Laterality: N/A;    Allergies  Allergies  Allergen Reactions   Codeine Other (See  Comments)    "Makes me really sick, I got hallucinations when the dose was really high" 08/13/20 Pt also states he is "very" sensitive to strong pain medications   Hydrocodone Nausea And Vomiting   Mounjaro [Tirzepatide]     Dizziness and nausea   Penicillins Other (See Comments)    "I don't remember, but mom said don't take" 08/13/20    History of Present Illness    Glen [name] has a PMH of PAF, palpitations, hypercholesteremia, echocardiogram 03/04/2023 showed LVEF of 55-60%, G1 DD, and mild aortic valve regurgitation.  Coronary calcium score 6/22 was 0.  CHA2DS2-VASc score 0.  He was referred to Dr. Jens Som on 02/11/2023.  He reported intermittent palpitations for 5 to 6 years.  He described palpitations as heart fluttering that could last for 2 hours and would resolve spontaneously.  He did note associated tightness in his jaw.  He reported a prolonged episode in February that was associated with discomfort in his arms bilaterally.  He noted that his episodes would occur 2-3 times per month.  They were also more prolonged.  He reported that he is very physically active and that he was able to run 5 miles without increased dyspnea, chest pain, and denied history of syncope.  He was started on metoprolol,, sleep study, echocardiogram was ordered which showed normal LVEF, G1 DD, and mild AI.  He was also referred to A-fib clinic.  And cardiac event monitor was ordered.  He presents to the clinic today for follow-up evaluation and states he notices episodes of palpitations usually in the evening.  He notes that he can feel them coming on when they do come on.  We reviewed his cardiac event monitor and echocardiogram.  He expressed understanding.  I reviewed vagal maneuvers and option for taking an extra half dose of metoprolol if needed for sustained palpitations.  He wishes to defer sleep study at this time.  We will plan follow-up in 12 months.  Today he denies chest pain, shortness of  breath, lower extremity edema, fatigue,  melena, hematuria, hemoptysis, diaphoresis, weakness, presyncope, syncope, orthopnea, and PND.   Home Medications    Prior to Admission medications   Medication Sig Start Date End Date Taking? Authorizing Provider  ALPRAZolam (XANAX) 0.25 MG tablet Take 1 tablet (0.25 mg total) by mouth 2 (two) times daily as needed for anxiety (Panic attacks). 11/07/21   Plotnikov, Georgina Quint, MD  aspirin-acetaminophen-caffeine (EXCEDRIN MIGRAINE) [phone] MG tablet Take 1 tablet by mouth as needed for headache.    [provider]  azithromycin (ZITHROMAX Z-PAK) 250 MG tablet As directed 04/03/23   Plotnikov, Georgina Quint, MD  celecoxib (CELEBREX) 200 MG capsule Take 200 mg by mouth as needed. 09/10/22   [provider]  citalopram (CELEXA) 20 MG tablet Take 1/2 (one-half) tablet by mouth once daily 10/29/22   Plotnikov, Georgina Quint, MD  diclofenac Sodium (VOLTAREN) 1 % GEL Apply 2 g topically as needed. 08/29/21   [provider]  LUMIFY 0.025 % SOLN SMARTSIG:1 Drop(s) In Eye(s) PRN 05/23/21   [provider]  mesalamine (CANASA) 1000 MG suppository Place 1 suppository (1,000 mg total) rectally at bedtime. Patient taking differently: Place 1,000 mg rectally as needed. 07/12/22   Iva Boop, MD  metoprolol succinate (TOPROL XL) 25 MG 24 hr tablet Take 1 tablet (25 mg total) by mouth daily. Take at bedtime. 02/11/23   Lewayne Bunting, MD  omeprazole (PRILOSEC) 40 MG capsule Take 40 mg by mouth daily. 11/13/22   [provider]  ondansetron (ZOFRAN-ODT) 4 MG disintegrating tablet Take 4 mg by mouth every 8 (eight) hours as needed. 02/07/23   [provider]  pantoprazole (PROTONIX) 40 MG tablet Take 1 tablet (40 mg total) by mouth 2 (two) times daily before a meal. 09/27/22   Plotnikov, Georgina Quint, MD  Semaglutide-Weight Management (WEGOVY [place]) Inject 0.125 mg into the skin once a week. Compound version-    [provider]  SUMAtriptan (IMITREX) 20 MG/ACT nasal spray Place 1 spray (20 mg total) into the nose every 2 (two) hours as needed for migraine or headache. May repeat in 2 hours if headache persists or recurs. 06/05/22   Plotnikov, Georgina Quint, MD  valACYclovir (VALTREX) 500 MG tablet TAKE 1 TABLET BY MOUTH TWICE DAILY AS NEEDED FOR FEVER BLISTERS FOR 5 DAYS 02/17/20   Plotnikov, Georgina Quint, MD  zolmitriptan (ZOMIG) 5 MG tablet Take 1 tablet (5 mg total) by mouth as needed for migraine. 06/05/22   Plotnikov, Georgina Quint, MD    Family History    Family History  Problem Relation Age of Onset   Stroke Mother    Lupus Mother    Arthritis Mother    Heart disease Paternal Uncle 35       atrial fib   Arthritis Maternal Grandmother    Heart disease Paternal Grandfather 47       MI   Depression Other    Hypertension  Other    Stroke Other    Pancreatic cancer Other    Liver cancer Other    Colon cancer Neg Hx    Esophageal cancer Neg Hx    Stomach cancer Neg Hx    Rectal cancer Neg Hx    He indicated that his mother is deceased. He indicated that his father is alive. He indicated that the status of his maternal grandmother is unknown. He indicated that the status of his paternal grandfather is unknown. He indicated that the status of his paternal uncle is unknown. He indicated that the status of his neg hx is unknown. He indicated that the status of his other is unknown.  Social History    Social History   Socioeconomic History   Marital status: Married    Spouse name: Not on file   Number of children: Not on file   Years of education: Not on file   Highest education level: Associate degree: occupational, Scientist, product/process development, or vocational program  Occupational History   Not on file  Tobacco Use   Smoking status: Former    Current packs/day: 0.00    Types: Cigarettes    Quit date: 06/19/1999    Years since quitting: 23.9   Smokeless tobacco: Never  Vaping Use   Vaping status: Never Used  Substance and  Sexual Activity   Alcohol use: Yes    Comment: a glass of wine each night.   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   Former smoker, 1 alcoholic beverage a day 2 caffeinated beverages a day no drug use or tobacco now   Regular Exercise- yes   Social Determinants of Health   Financial Resource Strain: Low Risk  (04/14/2023)   Overall Financial Resource Strain (CARDIA)    Difficulty of Paying Living Expenses: Not hard at all  Food Insecurity: No Food Insecurity (04/14/2023)   Hunger Vital Sign    Worried About Running Out of Food in the Last Year: Never true    Ran Out of Food in the Last Year: Never true  Transportation Needs: No Transportation Needs (04/14/2023)   PRAPARE - Administrator, Civil Service (Medical): No    Lack of Transportation (Non-Medical): No  Physical Activity: Sufficiently Active (04/14/2023)   Exercise Vital Sign    Days of Exercise per Week: 3 days    Minutes of Exercise per Session: 60 min  Stress: No Stress Concern Present (04/14/2023)   [employer] of Occupational Health - Occupational Stress Questionnaire    Feeling of Stress : Only a little  Social Connections: Moderately Isolated (04/14/2023)   Social Connection and Isolation Panel [NHANES]    Frequency of Communication with Friends and Family: More than three times a week    Frequency of Social Gatherings with Friends and Family: Never    Attends Religious Services: Never    Database administrator or Organizations: No    Attends Engineer, structural: Not on file    Marital Status: Married  Catering manager Violence: Not on file     Review of Systems    General:  No chills, fever, night sweats or weight changes.  Cardiovascular:  No chest pain, dyspnea on exertion, edema, orthopnea, palpitations, paroxysmal nocturnal  dyspnea. Dermatological: No rash, lesions/masses Respiratory: No cough, dyspnea Urologic: No  hematuria, dysuria Abdominal:   No nausea, vomiting, diarrhea, bright red blood per rectum, melena, or hematemesis Neurologic:  No visual changes, wkns, changes in mental status. All other systems reviewed and are otherwise negative except as noted above.  Physical Exam    VS:  BP 116/74   Pulse 67   Ht 6\' 1"  (1.854 m)   Wt 209 lb (94.8 kg)   SpO2 97%   BMI 27.57 kg/m  , BMI Body mass index is 27.57 kg/m. GEN: Well nourished, well developed, in no acute distress. HEENT: normal. Neck: Supple, no JVD, carotid bruits, or masses. Cardiac: RRR, no murmurs, rubs, or gallops. No clubbing, cyanosis, edema.  Radials/DP/PT 2+ and equal bilaterally.  Respiratory:  Respirations regular and unlabored, clear to auscultation bilaterally. GI: Soft, nontender, nondistended, BS + x 4. MS: no deformity or atrophy. Skin: warm and dry, no rash. Neuro:  Strength and sensation are intact. Psych: Normal affect.  Accessory Clinical Findings    Recent Labs: 04/15/2023: ALT 16; BUN 10; Creatinine, Ser 0.92; Hemoglobin 15.3; Platelets 256.0; Potassium 4.5; Sodium 139; TSH 1.13   Recent Lipid Panel    Component Value Date/Time   CHOL 221 (H) 04/15/2023 1004   TRIG 89.0 04/15/2023 1004   HDL 56.40 04/15/2023 1004   CHOLHDL 4 04/15/2023 1004   VLDL 17.8 04/15/2023 1004   LDLCALC 146 (H) 04/15/2023 1004   LDLDIRECT 150.6 06/29/2013 0837         ECG personally reviewed by me today-none today.    Echocardiogram 03/04/2023  IMPRESSIONS     1. Left ventricular ejection fraction, by estimation, is 55 to 60%. Left  ventricular ejection fraction by 3D volume is 58 %. The left ventricle has  normal function. The left ventricle has no regional wall motion  abnormalities. Left ventricular diastolic   parameters are consistent with Grade I diastolic dysfunction (impaired  relaxation). The average left  ventricular global longitudinal strain is  -24.0 %. The global longitudinal strain is normal.   2. Right ventricular systolic function is normal. The right ventricular  size is normal. There is normal pulmonary artery systolic pressure. The  estimated right ventricular systolic pressure is 28.4 mmHg.   3. The mitral valve is normal in structure. Trivial mitral valve  regurgitation. No evidence of mitral stenosis.   4. The aortic valve is tricuspid. Aortic valve regurgitation is mild. No  aortic stenosis is present. Aortic regurgitation PHT measures 502 msec.   5. The inferior vena cava is normal in size with greater than 50%  respiratory variability, suggesting right atrial pressure of 3 mmHg.   FINDINGS   Left Ventricle: Left ventricular ejection fraction, by estimation, is 55  to 60%. Left ventricular ejection fraction by 3D volume is 58 %. The left  ventricle has normal function. The left ventricle has no regional wall  motion abnormalities. The average  left ventricular global longitudinal strain is -24.0 %. The global  longitudinal strain is normal. The left ventricular internal cavity size  was normal in size. There is no left ventricular hypertrophy. Left  ventricular diastolic parameters are consistent   with Grade I diastolic dysfunction (impaired relaxation).   Right Ventricle: The right ventricular size is normal. No increase in  right ventricular wall thickness. Right ventricular systolic function is  normal. There is normal pulmonary artery systolic pressure. The tricuspid  regurgitant velocity is 2.52 m/s, and   with an assumed right atrial pressure of 3 mmHg, the estimated right  ventricular systolic pressure  is 28.4 mmHg.   Left Atrium: Left atrial size was normal in size.   Right Atrium: Right atrial size was normal in size.   Pericardium: There is no evidence of pericardial effusion.   Mitral Valve: The mitral valve is normal in structure. Trivial mitral  valve  regurgitation. No evidence of mitral valve stenosis.   Tricuspid Valve: The tricuspid valve is normal in structure. Tricuspid  valve regurgitation is mild . No evidence of tricuspid stenosis.   Aortic Valve: The aortic valve is tricuspid. Aortic valve regurgitation is  mild. Aortic regurgitation PHT measures 502 msec. No aortic stenosis is  present.   Pulmonic Valve: The pulmonic valve was normal in structure. Pulmonic valve  regurgitation is not visualized. No evidence of pulmonic stenosis.   Aorta: The aortic root is normal in size and structure.   Venous: The inferior vena cava is normal in size with greater than 50%  respiratory variability, suggesting right atrial pressure of 3 mmHg.   IAS/Shunts: No atrial level shunt detected by color flow Doppler.    Cardiac event monitor 03/04/2023  Patch Wear Time:  13 days and 21 hours    Patient had a min HR of 54 bpm, max HR of 200 bpm, and avg HR of 73 bpm.  Predominant underlying rhythm was Sinus Rhythm.  2 Ventricular Tachycardia runs occurred, fastest lasting 5 beats with a max rate of 200 bpm, longest 6 beats with an avg rate of 108 bpm Less than 1% ventricular and supraventricular ectopy No patient triggered episodes recorded   Will Camnitz, MD    Assessment & Plan   1.  Paroxysmal atrial fibrillation-heart rate today 67.  Denies recent episodes of accelerated or irregular heartbeat.  Reports history of episodes for the last 5 5-6 years.  Awaiting result of sleep study.  Cardiac event monitor showed minimum heart rate of 54, maximum heart rate of 200, average heart rate of 73 bpm, mainly sinus rhythm, 2 episodes of ventricular tachycardia and less than 1% ventricular ectopy.  No patient triggered episodes.  Echocardiogram showed normal LVEF, G1 DD, and mild aortic valve regurgitation Continue metoprolol Avoid triggers caffeine, chocolate, dehydration etc. Vagal maneuvers reviewed  Palpitations-denies sustained  episodes. Maintain physical activity Continue metoprolol-May take an extra dose of metoprolol as needed for sustained palpitations  Hyperlipidemia-LDL 146 on 04/15/23. High-fiber diet Maintain physical activity  Disposition: Follow-up with Dr. Jens Som or me in 12 months.   Thomasene Ripple. Tommye Lehenbauer NP-C     05/14/2023, 10:59 AM [facility] [address] Office [phone] Fax [phone]    I spent 14 minutes examining this patient, reviewing medications, and using patient centered shared decision making involving her cardiac care.   I spent greater than 20 minutes reviewing her past medical history,  medications, and prior cardiac tests.

## 2023-05-12 ENCOUNTER — Encounter: Payer: Self-pay | Admitting: Internal Medicine

## 2023-05-13 ENCOUNTER — Other Ambulatory Visit: Payer: Self-pay

## 2023-05-13 DIAGNOSIS — K219 Gastro-esophageal reflux disease without esophagitis: Secondary | ICD-10-CM

## 2023-05-13 MED ORDER — OMEPRAZOLE 40 MG PO CPDR: 40.0000 mg | DELAYED_RELEASE_CAPSULE | Freq: Every day | ORAL | 3 refills | Status: DC

## 2023-05-14 ENCOUNTER — Encounter: Payer: Self-pay | Admitting: General Practice

## 2023-05-14 ENCOUNTER — Other Ambulatory Visit: Payer: Self-pay | Admitting: Sports Medicine

## 2023-05-14 ENCOUNTER — Ambulatory Visit: Payer: Commercial Managed Care - HMO | Attending: General Practice | Admitting: General Practice

## 2023-05-14 ENCOUNTER — Encounter (HOSPITAL_COMMUNITY): Payer: Self-pay | Admitting: Sports Medicine

## 2023-05-14 ENCOUNTER — Encounter: Payer: Self-pay | Admitting: Sports Medicine

## 2023-05-14 VITALS — BP 116/74 | HR 67 | Ht 73.0 in | Wt 209.0 lb

## 2023-05-14 DIAGNOSIS — I480 Paroxysmal atrial fibrillation: Secondary | ICD-10-CM

## 2023-05-14 DIAGNOSIS — E7800 Pure hypercholesterolemia, unspecified: Secondary | ICD-10-CM

## 2023-05-14 DIAGNOSIS — R002 Palpitations: Secondary | ICD-10-CM

## 2023-05-14 DIAGNOSIS — M545 Low back pain, unspecified: Secondary | ICD-10-CM

## 2023-05-14 NOTE — Patient Instructions (Addendum)
Medication Instructions:  MAY TAKE EXTRA 1/2 OF YOU METOPROLOL FOR SUSTAINED PALPITATIONS  The current medical regimen is effective;  continue present plan and medications as directed. Please refer to the Current Medication list given to you today.  *If you need a refill on your cardiac medications before your next appointment, please call your pharmacy*  Lab Work: NONE  Other Instructions MAINTAIN PHYSICAL ACTIVITY-AS TOLERATED SEE BELOW FOR VAGAL MANEUVERS  Please try to avoid these triggers: Do not use any products that have nicotine or tobacco in them. These include cigarettes, e-cigarettes, and chewing tobacco. If you need help quitting, ask your doctor. Eat heart-healthy foods. Talk with your doctor about the right eating plan for you. Exercise regularly as told by your doctor. Stay hydrated Do not drink alcohol, Caffeine or chocolate. Lose weight if you are overweight. Do not use drugs, including cannabis  Follow-Up: At [facility], you and your health needs are our priority.  As part of our continuing mission to provide you with exceptional heart care, we have created designated Provider Care Teams.  These Care Teams include your primary Cardiologist (physician) and Advanced Practice Providers (APPs -  Physician Assistants and Nurse Practitioners) who all work together to provide you with the care you need, when you need it.  Your next appointment:   12 month(s)  Provider:   Olga Millers, MD  or Edd Fabian, FNP or ANY OTHER APP         Vagal maneuvers -Valsalva maneuver (bearing down like you're having a bowel movement (pooping)). -Gag Reflex-Gagging can stimulate the vagus nerve and stop an episode -Coughing-A forceful and sustained cough can create a response similar to bearing down.

## 2023-05-18 ENCOUNTER — Ambulatory Visit: Payer: Commercial Managed Care - HMO

## 2023-05-18 DIAGNOSIS — G8929 Other chronic pain: Secondary | ICD-10-CM | POA: Diagnosis not present

## 2023-05-18 DIAGNOSIS — M545 Low back pain, unspecified: Secondary | ICD-10-CM

## 2023-07-26 ENCOUNTER — Other Ambulatory Visit: Payer: Self-pay | Admitting: Internal Medicine

## 2023-07-26 MED ORDER — VALACYCLOVIR HCL 500 MG PO TABS: ORAL_TABLET | ORAL | 0 refills | Status: DC

## 2023-07-28 ENCOUNTER — Encounter: Payer: Self-pay | Admitting: Internal Medicine

## 2023-07-30 ENCOUNTER — Other Ambulatory Visit: Payer: Self-pay | Admitting: Internal Medicine

## 2023-07-30 MED ORDER — AZITHROMYCIN 250 MG PO TABS: ORAL_TABLET | ORAL | 0 refills | Status: DC

## 2023-08-08 ENCOUNTER — Ambulatory Visit: Payer: Commercial Managed Care - HMO | Admitting: Internal Medicine

## 2023-08-08 ENCOUNTER — Encounter: Payer: Self-pay | Admitting: Internal Medicine

## 2023-08-08 VITALS — BP 118/82 | HR 66 | Temp 98.7°F | Ht 73.0 in | Wt 224.6 lb

## 2023-08-08 DIAGNOSIS — J0100 Acute maxillary sinusitis, unspecified: Secondary | ICD-10-CM | POA: Diagnosis not present

## 2023-08-08 MED ORDER — CEFDINIR 300 MG PO CAPS: 300.0000 mg | ORAL_CAPSULE | Freq: Two times a day (BID) | ORAL | 0 refills | Status: DC

## 2023-08-08 NOTE — Progress Notes (Signed)
Foot  Subjective:  Patient ID: Glen [name], male    DOB: [birth year]  Age: 58 y.o. MRN: [id number]  CC: Sinus Problem (Sinsus issues for the past 11 days. Symptoms have stayed about the same over time. Pressure in teeth, around cheek, sinus pressure, productive cough (green/yellow phlegm), nasal congestion, post nasal drip. Patient prescribed azithromycin last Tuesday and has since finished it. Currently treating with tylenol sinus)   HPI Glen [name] presents for sSinsus issues for the past 11 days. Symptoms have stayed about the same over time. Pressure in teeth, around cheek, sinus pressure, productive cough (green/yellow phlegm), nasal congestion, post nasal drip. Patient prescribed azithromycin last Tuesday and has since finished it. Currently treating with tylenol sinus  Outpatient Medications Prior to Visit  Medication Sig Dispense Refill   ALPRAZolam (XANAX) 0.25 MG tablet Take 1 tablet (0.25 mg total) by mouth 2 (two) times daily as needed for anxiety (Panic attacks). 60 tablet 1   aspirin-acetaminophen-caffeine (EXCEDRIN MIGRAINE) 250-250-65 MG tablet Take 1 tablet by mouth as needed for headache.     azithromycin (ZITHROMAX Z-PAK) 250 MG tablet As directed 6 each 0   celecoxib (CELEBREX) 200 MG capsule Take 200 mg by mouth as needed.     citalopram (CELEXA) 20 MG tablet Take 1/2 (one-half) tablet by mouth once daily 30 tablet 5   diclofenac Sodium (VOLTAREN) 1 % GEL Apply 2 g topically as needed.     LUMIFY 0.025 % SOLN SMARTSIG:1 Drop(s) In Eye(s) PRN     mesalamine (CANASA) 1000 MG suppository Place 1 suppository (1,000 mg total) rectally at bedtime. (Patient taking differently: Place 1,000 mg rectally as needed.) 30 suppository 0   metoprolol succinate (TOPROL XL) 25 MG 24 hr tablet Take 1 tablet (25 mg total) by mouth daily. Take at bedtime. 90 tablet 1   omeprazole (PRILOSEC) 40 MG capsule Take 1 capsule (40 mg total) by mouth daily. 90 capsule 3   ondansetron (ZOFRAN-ODT) 4  MG disintegrating tablet Take 4 mg by mouth every 8 (eight) hours as needed.     pantoprazole (PROTONIX) 40 MG tablet Take 1 tablet (40 mg total) by mouth 2 (two) times daily before a meal. 60 tablet 11   Semaglutide-Weight Management (WEGOVY [place]) Inject 0.125 mg into the skin once a week. Compound version-     SUMAtriptan (IMITREX) 20 MG/ACT nasal spray Place 1 spray (20 mg total) into the nose every 2 (two) hours as needed for migraine or headache. May repeat in 2 hours if headache persists or recurs. 1 each 5   valACYclovir (VALTREX) 500 MG tablet TAKE 1 TABLET BY MOUTH TWICE DAILY AS NEEDED FOR FEVER BLISTERS FOR 5 DAYS 30 tablet 0   zolmitriptan (ZOMIG) 5 MG tablet Take 1 tablet (5 mg total) by mouth as needed for migraine. 10 tablet 3   No facility-administered medications prior to visit.    ROS: Review of Systems  Constitutional:  Negative for appetite change, fatigue and unexpected weight change.  HENT:  Positive for congestion, postnasal drip, rhinorrhea and sinus pain. Negative for nosebleeds, sneezing, sore throat and trouble swallowing.   Eyes:  Negative for itching and visual disturbance.  Respiratory:  Negative for cough.   Cardiovascular:  Negative for chest pain, palpitations and leg swelling.  Gastrointestinal:  Negative for abdominal distention, blood in stool, diarrhea and nausea.  Genitourinary:  Negative for frequency and hematuria.  Musculoskeletal:  Negative for back pain, gait problem, joint swelling and neck pain.  Skin:  Negative  for rash.  Neurological:  Positive for headaches. Negative for dizziness, tremors, speech difficulty and weakness.  Psychiatric/Behavioral:  Negative for agitation, dysphoric mood and sleep disturbance. The patient is not nervous/anxious.     Objective:  BP 118/82   Pulse 66   Temp 98.7 F (37.1 C)   Ht 6\' 1"  (1.854 m)   Wt 224 lb 9.6 oz (101.9 kg)   SpO2 99%   BMI 29.63 kg/m   BP Readings from Last 3 Encounters:  08/08/23 118/82   05/14/23 116/74  04/15/23 116/74    Wt Readings from Last 3 Encounters:  08/08/23 224 lb 9.6 oz (101.9 kg)  05/14/23 209 lb (94.8 kg)  04/15/23 207 lb (93.9 kg)    Physical Exam Constitutional:      General: He is not in acute distress.    Appearance: Normal appearance. He is well-developed.     Comments: NAD  HENT:     Right Ear: There is no impacted cerumen.     Left Ear: There is no impacted cerumen.     Nose: Congestion and rhinorrhea present.     Mouth/Throat:     Pharynx: Posterior oropharyngeal erythema present. No oropharyngeal exudate.  Eyes:     Conjunctiva/sclera: Conjunctivae normal.     Pupils: Pupils are equal, round, and reactive to light.  Neck:     Thyroid: No thyromegaly.     Vascular: No JVD.  Cardiovascular:     Rate and Rhythm: Normal rate and regular rhythm.     Heart sounds: Normal heart sounds. No murmur heard.    No friction rub. No gallop.  Pulmonary:     Effort: Pulmonary effort is normal. No respiratory distress.     Breath sounds: Normal breath sounds. No wheezing or rales.  Chest:     Chest wall: No tenderness.  Abdominal:     General: Bowel sounds are normal. There is no distension.     Palpations: Abdomen is soft. There is no mass.     Tenderness: There is no abdominal tenderness. There is no guarding or rebound.  Musculoskeletal:        General: No tenderness. Normal range of motion.     Cervical back: Normal range of motion.  Lymphadenopathy:     Cervical: No cervical adenopathy.  Skin:    General: Skin is warm and dry.     Findings: No rash.  Neurological:     Mental Status: He is alert and oriented to person, place, and time.     Cranial Nerves: No cranial nerve deficit.     Motor: No abnormal muscle tone.     Coordination: Coordination normal.     Gait: Gait normal.     Deep Tendon Reflexes: Reflexes are normal and symmetric.  Psychiatric:        Behavior: Behavior normal.        Thought Content: Thought content normal.         Judgment: Judgment normal.    Swollen nasal mucosa L TM - red   Lab Results  Component Value Date   WBC 5.6 04/15/2023   HGB 15.3 04/15/2023   HCT 45.0 04/15/2023   PLT 256.0 04/15/2023   GLUCOSE 75 04/15/2023   CHOL 221 (H) 04/15/2023   TRIG 89.0 04/15/2023   HDL 56.40 04/15/2023   LDLDIRECT 150.6 06/29/2013   LDLCALC 146 (H) 04/15/2023   ALT 16 04/15/2023   AST 20 04/15/2023   NA 139 04/15/2023   K 4.5 04/15/2023  CL 102 04/15/2023   CREATININE 0.92 04/15/2023   BUN 10 04/15/2023   CO2 31 04/15/2023   TSH 1.13 04/15/2023   PSA 1.56 04/15/2023   HGBA1C 5.2 04/15/2023    ECHOCARDIOGRAM COMPLETE Result Date: 03/04/2023    ECHOCARDIOGRAM REPORT   Patient Name:   ARLIS YALE Date of Exam: 03/04/2023 Medical Rec #:  [id number]      Height:       74.0 in Accession #:    [id number]     Weight:       209.6 lb Date of Birth:  05/22/1966       BSA:          2.217 m Patient Age:    57 years       BP:           126/68 mmHg Patient Gender: M              HR:           66 bpm. Exam Location:  [address] Procedure: 2D Echo, 3D Echo, Cardiac Doppler, Color Doppler and Strain Analysis Indications:    I48.91 Atrial Fibrillation  History:        Patient has prior history of Echocardiogram examinations, most                 recent 04/28/2012. Risk Factors:Hypertension.  Sonographer:    Sedonia Small Rodgers-Jones RDCS Referring Phys: [id number] BRIAN S CRENSHAW IMPRESSIONS  1. Left ventricular ejection fraction, by estimation, is 55 to 60%. Left ventricular ejection fraction by 3D volume is 58 %. The left ventricle has normal function. The left ventricle has no regional wall motion abnormalities. Left ventricular diastolic  parameters are consistent with Grade I diastolic dysfunction (impaired relaxation). The average left ventricular global longitudinal strain is -24.0 %. The global longitudinal strain is normal.  2. Right ventricular systolic function is normal. The right ventricular size is normal.  There is normal pulmonary artery systolic pressure. The estimated right ventricular systolic pressure is 28.4 mmHg.  3. The mitral valve is normal in structure. Trivial mitral valve regurgitation. No evidence of mitral stenosis.  4. The aortic valve is tricuspid. Aortic valve regurgitation is mild. No aortic stenosis is present. Aortic regurgitation PHT measures 502 msec.  5. The inferior vena cava is normal in size with greater than 50% respiratory variability, suggesting right atrial pressure of 3 mmHg. FINDINGS  Left Ventricle: Left ventricular ejection fraction, by estimation, is 55 to 60%. Left ventricular ejection fraction by 3D volume is 58 %. The left ventricle has normal function. The left ventricle has no regional wall motion abnormalities. The average left ventricular global longitudinal strain is -24.0 %. The global longitudinal strain is normal. The left ventricular internal cavity size was normal in size. There is no left ventricular hypertrophy. Left ventricular diastolic parameters are consistent  with Grade I diastolic dysfunction (impaired relaxation). Right Ventricle: The right ventricular size is normal. No increase in right ventricular wall thickness. Right ventricular systolic function is normal. There is normal pulmonary artery systolic pressure. The tricuspid regurgitant velocity is 2.52 m/s, and  with an assumed right atrial pressure of 3 mmHg, the estimated right ventricular systolic pressure is 28.4 mmHg. Left Atrium: Left atrial size was normal in size. Right Atrium: Right atrial size was normal in size. Pericardium: There is no evidence of pericardial effusion. Mitral Valve: The mitral valve is normal in structure. Trivial mitral valve regurgitation. No evidence of mitral valve stenosis. Tricuspid Valve:  The tricuspid valve is normal in structure. Tricuspid valve regurgitation is mild . No evidence of tricuspid stenosis. Aortic Valve: The aortic valve is tricuspid. Aortic valve  regurgitation is mild. Aortic regurgitation PHT measures 502 msec. No aortic stenosis is present. Pulmonic Valve: The pulmonic valve was normal in structure. Pulmonic valve regurgitation is not visualized. No evidence of pulmonic stenosis. Aorta: The aortic root is normal in size and structure. Venous: The inferior vena cava is normal in size with greater than 50% respiratory variability, suggesting right atrial pressure of 3 mmHg. IAS/Shunts: No atrial level shunt detected by color flow Doppler.  LEFT VENTRICLE PLAX 2D LVIDd:         5.20 cm         Diastology LVIDs:         3.00 cm         LV e' medial:    9.21 cm/s LV PW:         1.00 cm         LV E/e' medial:  6.8 LV IVS:        0.80 cm         LV e' lateral:   10.63 cm/s LVOT diam:     2.20 cm         LV E/e' lateral: 5.9 LV SV:         83 LV SV Index:   37              2D LVOT Area:     3.80 cm        Longitudinal                                Strain                                2D Strain GLS  -23.7 %                                (A2C):                                2D Strain GLS  -23.3 %                                (A3C):                                2D Strain GLS  -24.9 %                                (A4C):                                2D Strain GLS  -24.0 %                                Avg:  3D Volume EF                                LV 3D EF:    Left                                             ventricul                                             ar                                             ejection                                             fraction                                             by 3D                                             volume is                                             58 %.                                 3D Volume EF:                                3D EF:        58 %                                LV EDV:       181 ml                                LV ESV:       75 ml                                 LV SV:        106 ml RIGHT VENTRICLE             IVC RV Basal diam:  4.70 cm     IVC diam: 1.70 cm RV S prime:     16.05 cm/s TAPSE (  M-mode): 2.4 cm LEFT ATRIUM             Index        RIGHT ATRIUM           Index LA diam:        4.80 cm 2.16 cm/m   RA Area:     16.30 cm LA Vol (A2C):   79.1 ml 35.68 ml/m  RA Volume:   51.10 ml  23.05 ml/m LA Vol (A4C):   46.9 ml 21.15 ml/m LA Biplane Vol: 62.2 ml 28.05 ml/m  AORTIC VALVE LVOT Vmax:   101.25 cm/s LVOT Vmean:  68.150 cm/s LVOT VTI:    0.218 m AI PHT:      502 msec  AORTA Ao Root diam: 3.50 cm Ao Asc diam:  3.60 cm MITRAL VALVE               TRICUSPID VALVE MV Area (PHT): 3.24 cm    TR Peak grad:   25.4 mmHg MV Decel Time: 234 msec    TR Vmax:        252.00 cm/s MV E velocity: 62.60 cm/s MV A velocity: 59.45 cm/s  SHUNTS MV E/A ratio:  1.05        Systemic VTI:  0.22 m                            Systemic Diam: 2.20 cm Donato Schultz MD Electronically signed by Donato Schultz MD Signature Date/Time: 03/04/2023/11:09:55 AM    Final     Assessment & Plan:   Problem List Items Addressed This Visit     Acute sinusitis - Primary   Worse Omnicef x 10 d PCN allergic      Relevant Medications   cefdinir (OMNICEF) 300 MG capsule      Meds ordered this encounter  Medications   cefdinir (OMNICEF) 300 MG capsule    Sig: Take 1 capsule (300 mg total) by mouth 2 (two) times daily.    Dispense:  20 capsule    Refill:  0      Follow-up: No follow-ups on file.  Sonda Primes, MD

## 2023-08-08 NOTE — Assessment & Plan Note (Signed)
Worse Omnicef x 10 d PCN allergic

## 2023-08-13 ENCOUNTER — Ambulatory Visit: Payer: Commercial Managed Care - HMO | Admitting: Internal Medicine

## 2023-08-21 ENCOUNTER — Encounter: Payer: Self-pay | Admitting: Internal Medicine

## 2023-08-25 ENCOUNTER — Other Ambulatory Visit: Payer: Self-pay | Admitting: Internal Medicine

## 2023-08-25 MED ORDER — CEFDINIR 300 MG PO CAPS: 300.0000 mg | ORAL_CAPSULE | Freq: Two times a day (BID) | ORAL | 0 refills | Status: DC

## 2023-09-05 ENCOUNTER — Encounter: Payer: Self-pay | Admitting: Internal Medicine

## 2023-09-09 ENCOUNTER — Other Ambulatory Visit: Payer: Self-pay | Admitting: Internal Medicine

## 2023-09-09 ENCOUNTER — Encounter: Payer: Self-pay | Admitting: Internal Medicine

## 2023-09-09 ENCOUNTER — Ambulatory Visit: Admitting: Internal Medicine

## 2023-09-09 VITALS — BP 126/74 | HR 98 | Temp 97.7°F | Ht 73.0 in | Wt 223.6 lb

## 2023-09-09 DIAGNOSIS — H6522 Chronic serous otitis media, left ear: Secondary | ICD-10-CM | POA: Diagnosis not present

## 2023-09-09 DIAGNOSIS — R42 Dizziness and giddiness: Secondary | ICD-10-CM | POA: Diagnosis not present

## 2023-09-09 DIAGNOSIS — J320 Chronic maxillary sinusitis: Secondary | ICD-10-CM | POA: Diagnosis not present

## 2023-09-09 DIAGNOSIS — H6590 Unspecified nonsuppurative otitis media, unspecified ear: Secondary | ICD-10-CM | POA: Insufficient documentation

## 2023-09-09 DIAGNOSIS — J0101 Acute recurrent maxillary sinusitis: Secondary | ICD-10-CM

## 2023-09-09 DIAGNOSIS — J329 Chronic sinusitis, unspecified: Secondary | ICD-10-CM | POA: Insufficient documentation

## 2023-09-09 MED ORDER — METHYLPREDNISOLONE 4 MG PO TBPK: ORAL_TABLET | ORAL | 0 refills | Status: DC

## 2023-09-09 NOTE — Progress Notes (Signed)
 Subjective:  Patient ID: Glen [name], male    DOB: 06/27/1965  Age: 58 y.o. MRN: [id number]  CC: Sinusitis (Ongoing sinusitis with inner ear issues (ear fullness, inability to "pop" ears, more prominent in left ear). No discharge or pain from ears. This resulting in lightheadedness/dizziness. Notes "cool" feeling in ear for the last couple of days.)   HPI Glen [name] presents for ongoing sinusitis with inner ear issues (ear fullness, inability to "pop" ears, more prominent in left ear). No discharge or pain from ears. This resulting in lightheadedness/dizziness. Notes "cool" feeling in ear for the last couple of days.  Pt took Z pac, then Cefdinir 1 month ago up to 2 weeks ago He felt dizzy twice in the shower, last time - this am x 10 min  Outpatient Medications Prior to Visit  Medication Sig Dispense Refill   ALPRAZolam (XANAX) 0.25 MG tablet Take 1 tablet (0.25 mg total) by mouth 2 (two) times daily as needed for anxiety (Panic attacks). 60 tablet 1   aspirin-acetaminophen-caffeine (EXCEDRIN MIGRAINE) 250-250-65 MG tablet Take 1 tablet by mouth as needed for headache.     cefdinir (OMNICEF) 300 MG capsule Take 1 capsule (300 mg total) by mouth 2 (two) times daily. 20 capsule 0   celecoxib (CELEBREX) 200 MG capsule Take 200 mg by mouth as needed.     citalopram (CELEXA) 20 MG tablet Take 1/2 (one-half) tablet by mouth once daily 30 tablet 5   diclofenac Sodium (VOLTAREN) 1 % GEL Apply 2 g topically as needed.     LUMIFY 0.025 % SOLN SMARTSIG:1 Drop(s) In Eye(s) PRN     mesalamine (CANASA) 1000 MG suppository Place 1 suppository (1,000 mg total) rectally at bedtime. (Patient taking differently: Place 1,000 mg rectally as needed.) 30 suppository 0   metoprolol succinate (TOPROL XL) 25 MG 24 hr tablet Take 1 tablet (25 mg total) by mouth daily. Take at bedtime. 90 tablet 1   omeprazole (PRILOSEC) 40 MG capsule Take 1 capsule (40 mg total) by mouth daily. 90 capsule 3   ondansetron  (ZOFRAN-ODT) 4 MG disintegrating tablet Take 4 mg by mouth every 8 (eight) hours as needed.     pantoprazole (PROTONIX) 40 MG tablet Take 1 tablet (40 mg total) by mouth 2 (two) times daily before a meal. 60 tablet 11   Semaglutide-Weight Management (WEGOVY [place]) Inject 0.125 mg into the skin once a week. Compound version-     SUMAtriptan (IMITREX) 20 MG/ACT nasal spray Place 1 spray (20 mg total) into the nose every 2 (two) hours as needed for migraine or headache. May repeat in 2 hours if headache persists or recurs. 1 each 5   valACYclovir (VALTREX) 500 MG tablet TAKE 1 TABLET BY MOUTH TWICE DAILY AS NEEDED FOR FEVER BLISTERS FOR 5 DAYS 30 tablet 0   zolmitriptan (ZOMIG) 5 MG tablet Take 1 tablet (5 mg total) by mouth as needed for migraine. 10 tablet 3   azithromycin (ZITHROMAX Z-PAK) 250 MG tablet As directed 6 each 0   No facility-administered medications prior to visit.    ROS: Review of Systems  Constitutional:  Negative for appetite change, fatigue and unexpected weight change.  HENT:  Positive for rhinorrhea and sinus pressure. Negative for congestion, ear discharge, hearing loss, nosebleeds, sneezing, sore throat, trouble swallowing and voice change.   Eyes:  Negative for itching and visual disturbance.  Respiratory:  Negative for cough.   Cardiovascular:  Negative for chest pain, palpitations and leg swelling.  Gastrointestinal:  Negative for abdominal distention, blood in stool, diarrhea and nausea.  Genitourinary:  Negative for frequency and hematuria.  Musculoskeletal:  Negative for back pain, gait problem, joint swelling and neck pain.  Skin:  Negative for rash.  Neurological:  Positive for dizziness. Negative for tremors, speech difficulty and weakness.  Hematological:  Does not bruise/bleed easily.  Psychiatric/Behavioral:  Negative for agitation, dysphoric mood and sleep disturbance. The patient is not nervous/anxious.     Objective:  BP 126/74   Pulse 98   Temp 97.7 F  (36.5 C)   Ht 6\' 1"  (1.854 m)   Wt 223 lb 9.6 oz (101.4 kg)   SpO2 99%   BMI 29.50 kg/m   BP Readings from Last 3 Encounters:  09/09/23 126/74  08/08/23 118/82  05/14/23 116/74    Wt Readings from Last 3 Encounters:  09/09/23 223 lb 9.6 oz (101.4 kg)  08/08/23 224 lb 9.6 oz (101.9 kg)  05/14/23 209 lb (94.8 kg)    Physical Exam Constitutional:      General: He is not in acute distress.    Appearance: Normal appearance. He is well-developed.     Comments: NAD  Eyes:     Conjunctiva/sclera: Conjunctivae normal.     Pupils: Pupils are equal, round, and reactive to light.  Neck:     Thyroid: No thyromegaly.     Vascular: No JVD.  Cardiovascular:     Rate and Rhythm: Normal rate and regular rhythm.     Heart sounds: Normal heart sounds. No murmur heard.    No friction rub. No gallop.  Pulmonary:     Effort: Pulmonary effort is normal. No respiratory distress.     Breath sounds: Normal breath sounds. No wheezing or rales.  Chest:     Chest wall: No tenderness.  Abdominal:     General: Bowel sounds are normal. There is no distension.     Palpations: Abdomen is soft. There is no mass.     Tenderness: There is no abdominal tenderness. There is no guarding or rebound.  Musculoskeletal:        General: No tenderness. Normal range of motion.     Cervical back: Normal range of motion.  Lymphadenopathy:     Cervical: No cervical adenopathy.  Skin:    General: Skin is warm and dry.     Findings: No rash.  Neurological:     Mental Status: He is alert and oriented to person, place, and time.     Cranial Nerves: No cranial nerve deficit.     Motor: No abnormal muscle tone.     Coordination: Coordination normal.     Gait: Gait normal.     Deep Tendon Reflexes: Reflexes are normal and symmetric.  Psychiatric:        Behavior: Behavior normal.        Thought Content: Thought content normal.        Judgment: Judgment normal.     B TMs w/fluid H-P (-) B Lab Results   Component Value Date   WBC 5.6 04/15/2023   HGB 15.3 04/15/2023   HCT 45.0 04/15/2023   PLT 256.0 04/15/2023   GLUCOSE 75 04/15/2023   CHOL 221 (H) 04/15/2023   TRIG 89.0 04/15/2023   HDL 56.40 04/15/2023   LDLDIRECT 150.6 06/29/2013   LDLCALC 146 (H) 04/15/2023   ALT 16 04/15/2023   AST 20 04/15/2023   NA 139 04/15/2023   K 4.5 04/15/2023   CL 102 04/15/2023  CREATININE 0.92 04/15/2023   BUN 10 04/15/2023   CO2 31 04/15/2023   TSH 1.13 04/15/2023   PSA 1.56 04/15/2023   HGBA1C 5.2 04/15/2023    ECHOCARDIOGRAM COMPLETE Result Date: 03/04/2023    ECHOCARDIOGRAM REPORT   Patient Name:   Glen [name] Date of Exam: 03/04/2023 Medical Rec #:  [id number]      Height:       74.0 in Accession #:    [id number]     Weight:       209.6 lb Date of Birth:  11-Jul-1965       BSA:          2.217 m Patient Age:    57 years       BP:           126/68 mmHg Patient Gender: M              HR:           66 bpm. Exam Location:  [address] Procedure: 2D Echo, 3D Echo, Cardiac Doppler, Color Doppler and Strain Analysis Indications:    I48.91 Atrial Fibrillation  History:        Patient has prior history of Echocardiogram examinations, most                 recent 04/28/2012. Risk Factors:Hypertension.  Sonographer:    Sedonia Small Rodgers-Jones RDCS Referring Phys: [id number] BRIAN S CRENSHAW IMPRESSIONS  1. Left ventricular ejection fraction, by estimation, is 55 to 60%. Left ventricular ejection fraction by 3D volume is 58 %. The left ventricle has normal function. The left ventricle has no regional wall motion abnormalities. Left ventricular diastolic  parameters are consistent with Grade I diastolic dysfunction (impaired relaxation). The average left ventricular global longitudinal strain is -24.0 %. The global longitudinal strain is normal.  2. Right ventricular systolic function is normal. The right ventricular size is normal. There is normal pulmonary artery systolic pressure. The estimated right ventricular  systolic pressure is 28.4 mmHg.  3. The mitral valve is normal in structure. Trivial mitral valve regurgitation. No evidence of mitral stenosis.  4. The aortic valve is tricuspid. Aortic valve regurgitation is mild. No aortic stenosis is present. Aortic regurgitation PHT measures 502 msec.  5. The inferior vena cava is normal in size with greater than 50% respiratory variability, suggesting right atrial pressure of 3 mmHg. FINDINGS  Left Ventricle: Left ventricular ejection fraction, by estimation, is 55 to 60%. Left ventricular ejection fraction by 3D volume is 58 %. The left ventricle has normal function. The left ventricle has no regional wall motion abnormalities. The average left ventricular global longitudinal strain is -24.0 %. The global longitudinal strain is normal. The left ventricular internal cavity size was normal in size. There is no left ventricular hypertrophy. Left ventricular diastolic parameters are consistent  with Grade I diastolic dysfunction (impaired relaxation). Right Ventricle: The right ventricular size is normal. No increase in right ventricular wall thickness. Right ventricular systolic function is normal. There is normal pulmonary artery systolic pressure. The tricuspid regurgitant velocity is 2.52 m/s, and  with an assumed right atrial pressure of 3 mmHg, the estimated right ventricular systolic pressure is 28.4 mmHg. Left Atrium: Left atrial size was normal in size. Right Atrium: Right atrial size was normal in size. Pericardium: There is no evidence of pericardial effusion. Mitral Valve: The mitral valve is normal in structure. Trivial mitral valve regurgitation. No evidence of mitral valve stenosis. Tricuspid Valve: The tricuspid valve is normal  in structure. Tricuspid valve regurgitation is mild . No evidence of tricuspid stenosis. Aortic Valve: The aortic valve is tricuspid. Aortic valve regurgitation is mild. Aortic regurgitation PHT measures 502 msec. No aortic stenosis is  present. Pulmonic Valve: The pulmonic valve was normal in structure. Pulmonic valve regurgitation is not visualized. No evidence of pulmonic stenosis. Aorta: The aortic root is normal in size and structure. Venous: The inferior vena cava is normal in size with greater than 50% respiratory variability, suggesting right atrial pressure of 3 mmHg. IAS/Shunts: No atrial level shunt detected by color flow Doppler.  LEFT VENTRICLE PLAX 2D LVIDd:         5.20 cm         Diastology LVIDs:         3.00 cm         LV e' medial:    9.21 cm/s LV PW:         1.00 cm         LV E/e' medial:  6.8 LV IVS:        0.80 cm         LV e' lateral:   10.63 cm/s LVOT diam:     2.20 cm         LV E/e' lateral: 5.9 LV SV:         83 LV SV Index:   37              2D LVOT Area:     3.80 cm        Longitudinal                                Strain                                2D Strain GLS  -23.7 %                                (A2C):                                2D Strain GLS  -23.3 %                                (A3C):                                2D Strain GLS  -24.9 %                                (A4C):                                2D Strain GLS  -24.0 %                                Avg:  3D Volume EF                                LV 3D EF:    Left                                             ventricul                                             ar                                             ejection                                             fraction                                             by 3D                                             volume is                                             58 %.                                 3D Volume EF:                                3D EF:        58 %                                LV EDV:       181 ml                                LV ESV:       75 ml                                LV SV:        106 ml RIGHT VENTRICLE             IVC RV Basal  diam:  4.70 cm     IVC diam: 1.70 cm RV S prime:     16.05 cm/s TAPSE (  M-mode): 2.4 cm LEFT ATRIUM             Index        RIGHT ATRIUM           Index LA diam:        4.80 cm 2.16 cm/m   RA Area:     16.30 cm LA Vol (A2C):   79.1 ml 35.68 ml/m  RA Volume:   51.10 ml  23.05 ml/m LA Vol (A4C):   46.9 ml 21.15 ml/m LA Biplane Vol: 62.2 ml 28.05 ml/m  AORTIC VALVE LVOT Vmax:   101.25 cm/s LVOT Vmean:  68.150 cm/s LVOT VTI:    0.218 m AI PHT:      502 msec  AORTA Ao Root diam: 3.50 cm Ao Asc diam:  3.60 cm MITRAL VALVE               TRICUSPID VALVE MV Area (PHT): 3.24 cm    TR Peak grad:   25.4 mmHg MV Decel Time: 234 msec    TR Vmax:        252.00 cm/s MV E velocity: 62.60 cm/s MV A velocity: 59.45 cm/s  SHUNTS MV E/A ratio:  1.05        Systemic VTI:  0.22 m                            Systemic Diam: 2.20 cm Donato Schultz MD Electronically signed by Donato Schultz MD Signature Date/Time: 03/04/2023/11:09:55 AM    Final     Assessment & Plan:   Problem List Items Addressed This Visit     Vertigo - Primary   BPV Medrol pack Afrin/flonase Benign Positional Vertigo symptoms ?on the left. Start Meclizine. Start Francee Piccolo - Daroff exercise several times a day as dirrected. ENT ref      Serous otitis media   BPV Medrol pack Afrin/flonase Benign Positional Vertigo symptoms ?on the left. Start Meclizine. Start Francee Piccolo - Daroff exercise several times a day as dirrected. ENT ref      Sinusitis, chronic    Medrol pack Afrin/flonase Benign Positional Vertigo symptoms ?on the left. Finish abx ENT ref      Relevant Medications   methylPREDNISolone (MEDROL DOSEPAK) 4 MG TBPK tablet      Meds ordered this encounter  Medications   methylPREDNISolone (MEDROL DOSEPAK) 4 MG TBPK tablet    Sig: As directed    Dispense:  21 tablet    Refill:  0      Follow-up: No follow-ups on file.  Sonda Primes, MD

## 2023-09-09 NOTE — Assessment & Plan Note (Addendum)
 BPV Medrol pack Afrin/flonase Benign Positional Vertigo symptoms ?on the left. Start Meclizine. Start Francee Piccolo - Daroff exercise several times a day as dirrected. ENT ref

## 2023-09-09 NOTE — Assessment & Plan Note (Signed)
  Medrol pack Afrin/flonase Benign Positional Vertigo symptoms ?on the left. Finish abx ENT ref

## 2023-09-09 NOTE — Assessment & Plan Note (Signed)
 BPV Medrol pack Afrin/flonase Benign Positional Vertigo symptoms ?on the left. Start Meclizine. Start Francee Piccolo - Daroff exercise several times a day as dirrected. ENT ref

## 2023-10-02 ENCOUNTER — Encounter: Payer: Self-pay | Admitting: Internal Medicine

## 2023-10-03 IMAGING — MR MR ANKLE*L* W/O CM
5 series · 37 of 40 positions shown · non-contrast
Comparison: Left foot radiographs 10/19/2020

CLINICAL DATA: Pain in left ankle and joints of left foot.

EXAM:
MRI OF THE LEFT ANKLE WITHOUT CONTRAST
TECHNIQUE: Multiplanar, multisequence MR imaging of the ankle was performed. No
intravenous contrast was administered.

[Series 3: T2 fat-sat · axial · left · 4.0mm · 0.52mm/px · z∈[-90,+75]mm · 9 of 34 slices shown (1 of 2)]
[im 1/34]
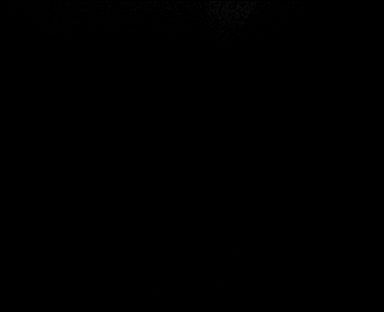
[im 5/34]
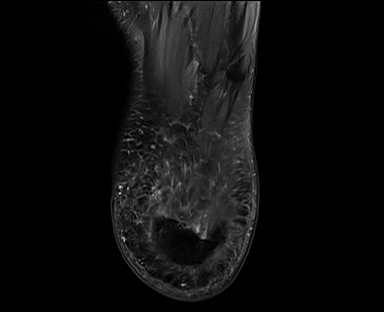
[im 9/34]
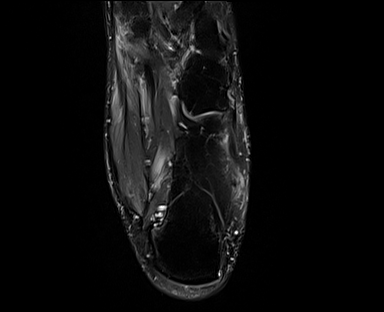
[im 13/34]
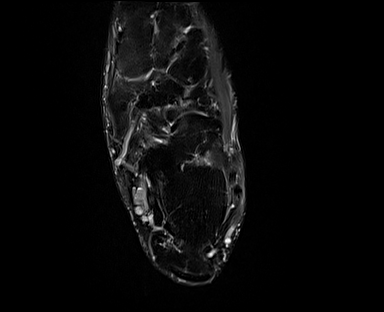
[im 17/34]
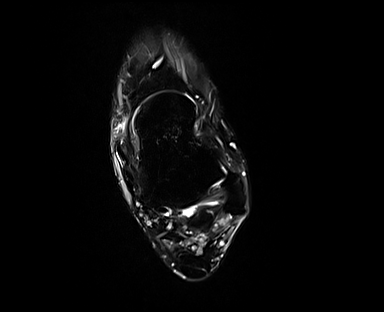
[im 21/34]
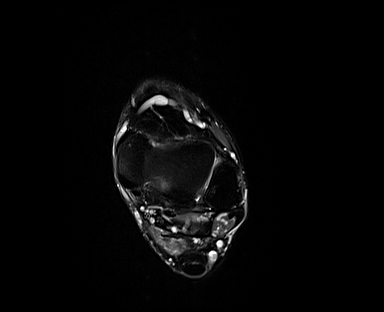
[im 25/34]
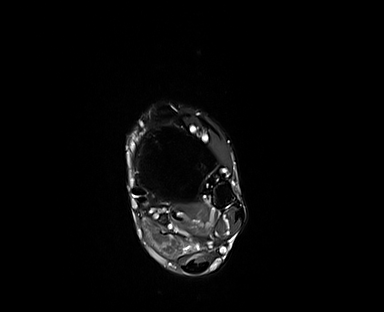
[im 29/34]
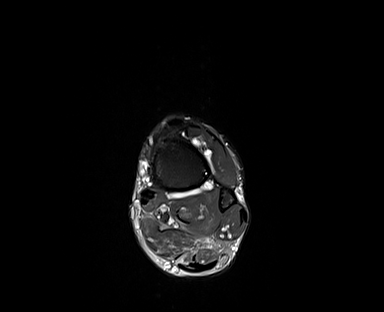
[im 34/34]
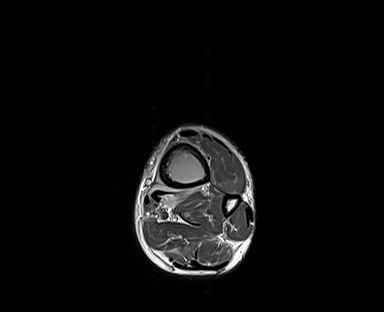

[Series 4: PD fat-sat · axial · left · 4.0mm · 0.62mm/px · z∈[-90,+75]mm · 9 of 34 slices shown]
[im 1/34]
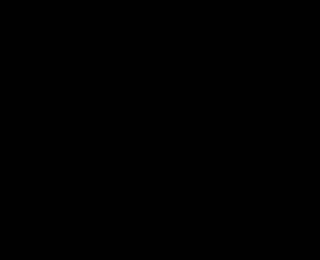
[im 5/34]
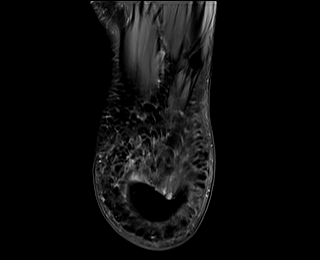
[im 9/34]
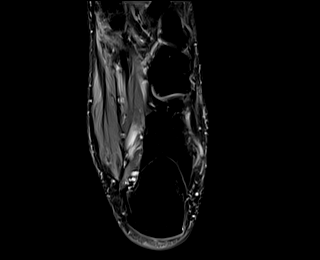
[im 13/34]
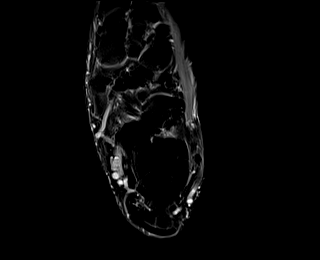
[im 17/34]
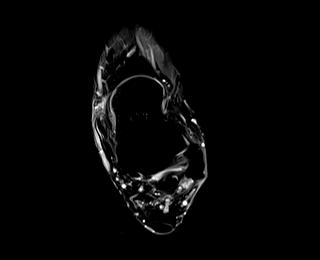
[im 21/34]
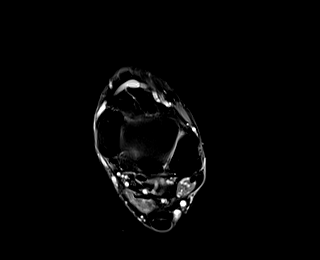
[im 25/34]
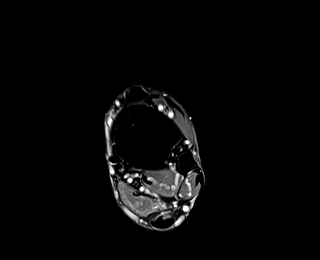
[im 29/34]
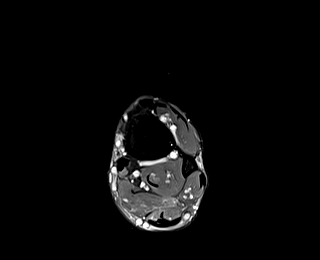
[im 34/34]
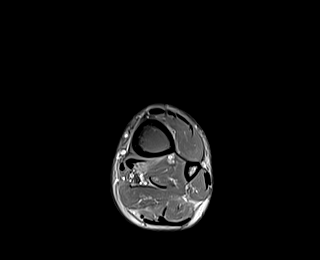

[Series 5: T2 fat-sat · coronal · left · 3.0mm · 0.50mm/px · 8 of 40 slices shown (2 of 2)]
[im 1/40]
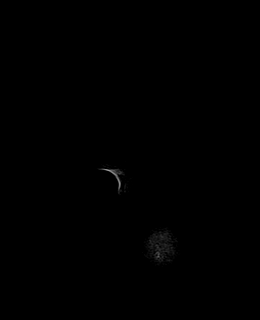
[im 5/40]
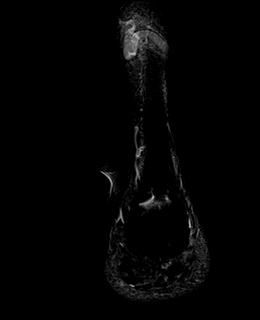
[im 14/40]
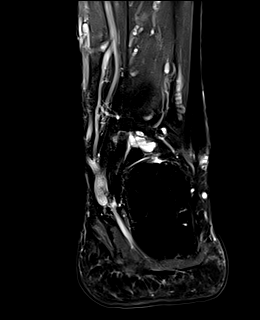
[im 18/40]
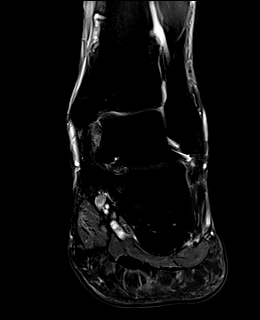
[im 22/40]
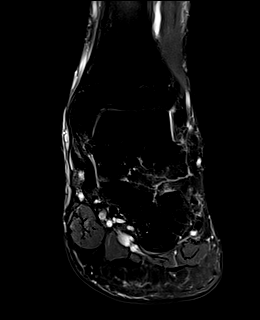
[im 27/40]
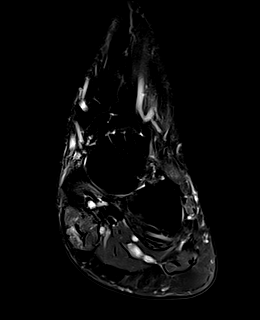
[im 35/40]
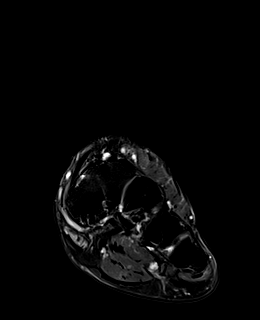
[im 40/40]
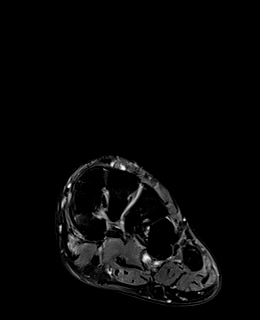

[Series 6: T1 · sagittal · left · 3.0mm · 0.50mm/px · 6 of 25 slices shown]
[im 1/25]
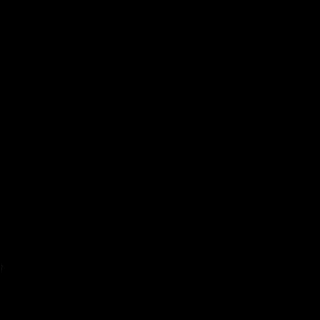
[im 5/25]
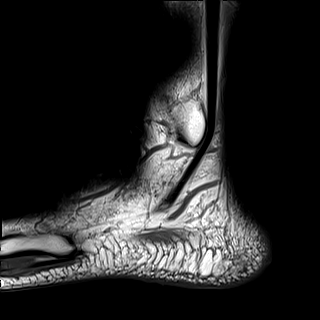
[im 10/25]
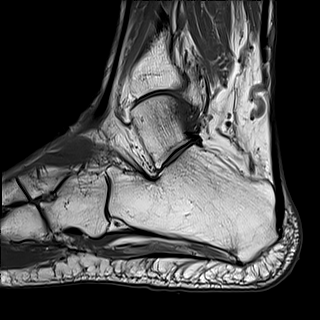
[im 15/25]
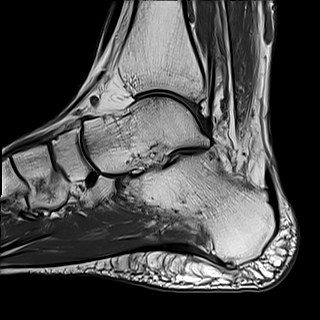
[im 20/25]
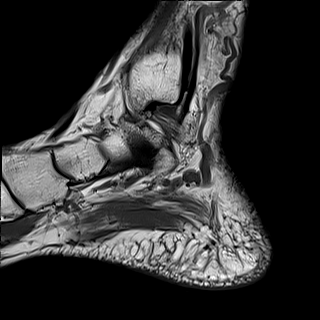
[im 25/25]
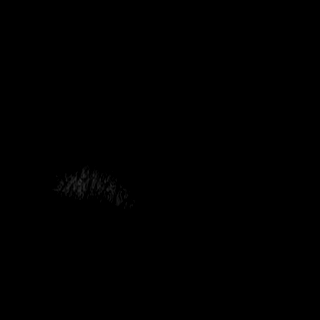

[Series 7: STIR · sagittal · left · 3.0mm · 0.31mm/px · 5 of 25 slices shown]
[im 1/25]
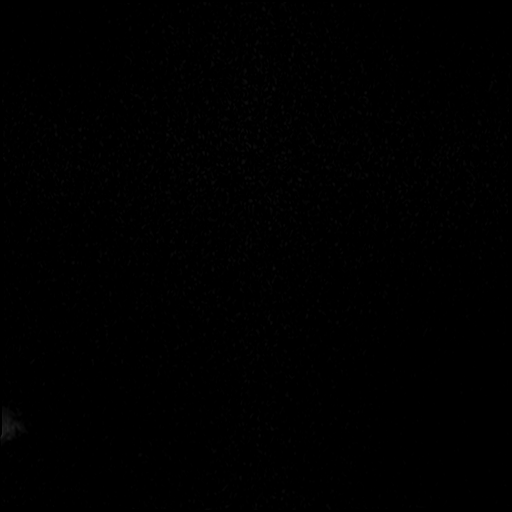
[im 5/25]
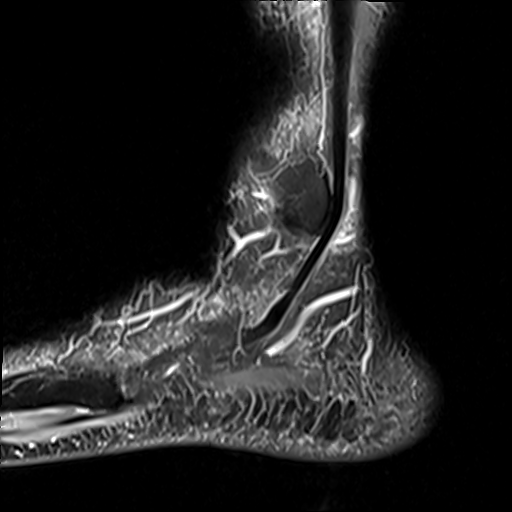
[im 10/25]
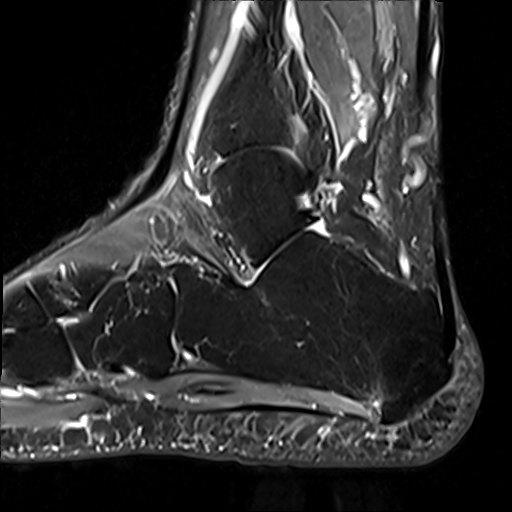
[im 15/25]
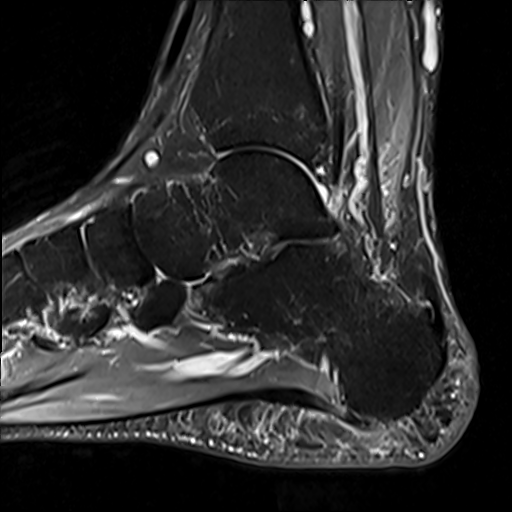
[im 20/25]
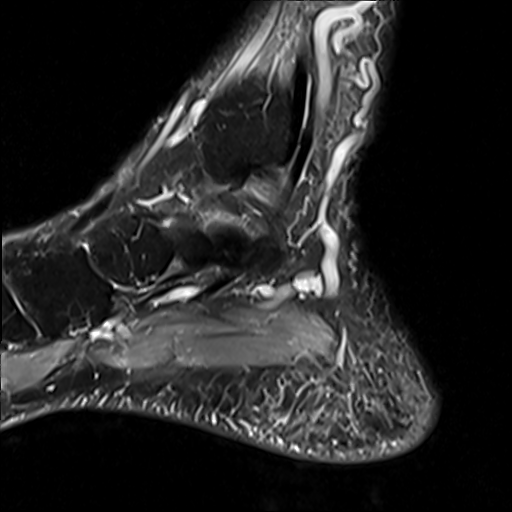

[37 of 40 positions shown; findings below may reference images not displayed]

FINDINGS: TENDONS

Peroneal: The peroneus brevis tendon appears intact. There is fluid
bright signal and attenuation of the anterior and posterior aspects
of the peroneus longus tendon just distal to the fibula (axial
images 20 and 21), a short segment partial-thickness longitudinal
tear.

Posteromedial: Mild posterior tibial tenosynovitis. The flexor
digitorum longus and flexor hallucis longus tendons are intact.

Anterior: The tibialis anterior, extensor hallucis longus and
extensor digitorum longus tendons are intact.

Achilles: Intact.

Plantar Fascia: There is mild marrow edema within the plantar aspect
of the calcaneus at the origin of the far lateral aspect of the
plantar fascia where there is also punctate fluid bright signal
indicating a tiny partial-thickness tear without plantar fascia
retraction (sagittal series 7, images 15 and 16, coronal series 5
images 10 and 11).

LIGAMENTS

Lateral: The anterior and posterior talofibular, anterior and
posterior tibiofibular, and calcaneofibular ligaments are intact.

Medial: Mild intermediate T2 signal likely chronic sprain of the
tibiotalar deep deltoid ligament. The tibial spring ligament appears
intact.

CARTILAGE

Ankle Joint: Intact cartilage.

Subtalar Joints/Sinus Tarsi: Mild thinning of the posterior subtalar
joint cartilage. Fat is preserved within the sinus tarsi.

Bones: Mild cartilage thinning and peripheral degenerative
osteophytosis of the midfoot articulations.

Other: None.
IMPRESSION: :
IMPRESSION: 1. Short-segment partial-thickness longitudinal tear of the peroneus
longus tendon just distal to the fibula.
2. Punctate partial-thickness tear at the far lateral aspect of the
plantar fascia origin with mild underlying calcaneal marrow edema.
No retraction.
3. Mild chronic sprain of the deltoid ligament.

## 2023-10-08 ENCOUNTER — Other Ambulatory Visit: Payer: Self-pay | Admitting: Internal Medicine

## 2023-10-08 MED ORDER — CELECOXIB 200 MG PO CAPS: 200.0000 mg | ORAL_CAPSULE | Freq: Every day | ORAL | 2 refills | Status: DC | PRN

## 2023-10-18 ENCOUNTER — Encounter: Payer: Self-pay | Admitting: Internal Medicine

## 2023-10-21 ENCOUNTER — Other Ambulatory Visit: Payer: Self-pay | Admitting: Internal Medicine

## 2023-10-21 DIAGNOSIS — J0101 Acute recurrent maxillary sinusitis: Secondary | ICD-10-CM

## 2023-10-24 ENCOUNTER — Encounter (INDEPENDENT_AMBULATORY_CARE_PROVIDER_SITE_OTHER): Payer: Self-pay | Admitting: Otolaryngology

## 2023-10-29 ENCOUNTER — Encounter: Payer: Self-pay | Admitting: Internal Medicine

## 2023-10-29 ENCOUNTER — Other Ambulatory Visit: Payer: Self-pay | Admitting: Internal Medicine

## 2023-10-30 ENCOUNTER — Other Ambulatory Visit: Payer: Self-pay | Admitting: Internal Medicine

## 2023-10-30 MED ORDER — CITALOPRAM HYDROBROMIDE 20 MG PO TABS: ORAL_TABLET | ORAL | 5 refills | Status: DC

## 2023-12-12 ENCOUNTER — Ambulatory Visit (INDEPENDENT_AMBULATORY_CARE_PROVIDER_SITE_OTHER): Admitting: Otolaryngology

## 2023-12-12 ENCOUNTER — Encounter (INDEPENDENT_AMBULATORY_CARE_PROVIDER_SITE_OTHER): Payer: Self-pay | Admitting: Otolaryngology

## 2023-12-12 VITALS — BP 143/79 | HR 75

## 2023-12-12 DIAGNOSIS — J329 Chronic sinusitis, unspecified: Secondary | ICD-10-CM | POA: Diagnosis not present

## 2023-12-12 DIAGNOSIS — H6990 Unspecified Eustachian tube disorder, unspecified ear: Secondary | ICD-10-CM | POA: Diagnosis not present

## 2023-12-12 DIAGNOSIS — R0981 Nasal congestion: Secondary | ICD-10-CM | POA: Diagnosis not present

## 2023-12-12 DIAGNOSIS — J3489 Other specified disorders of nose and nasal sinuses: Secondary | ICD-10-CM

## 2023-12-12 DIAGNOSIS — J342 Deviated nasal septum: Secondary | ICD-10-CM | POA: Diagnosis not present

## 2023-12-12 DIAGNOSIS — J3089 Other allergic rhinitis: Secondary | ICD-10-CM

## 2023-12-12 DIAGNOSIS — R519 Headache, unspecified: Secondary | ICD-10-CM

## 2023-12-12 DIAGNOSIS — H6993 Unspecified Eustachian tube disorder, bilateral: Secondary | ICD-10-CM

## 2023-12-12 DIAGNOSIS — J343 Hypertrophy of nasal turbinates: Secondary | ICD-10-CM

## 2023-12-12 DIAGNOSIS — H8113 Benign paroxysmal vertigo, bilateral: Secondary | ICD-10-CM

## 2023-12-12 DIAGNOSIS — R0982 Postnasal drip: Secondary | ICD-10-CM

## 2023-12-12 MED ORDER — LEVOCETIRIZINE DIHYDROCHLORIDE 5 MG PO TABS
5.0000 mg | ORAL_TABLET | Freq: Every evening | ORAL | 3 refills | Status: DC
Start: 2023-12-12 — End: 2024-04-20

## 2023-12-12 MED ORDER — FLUTICASONE PROPIONATE 50 MCG/ACT NA SUSP: 2.0000 | Freq: Two times a day (BID) | NASAL | 6 refills | Status: AC

## 2023-12-12 NOTE — Progress Notes (Signed)
 ENT CONSULT:  Reason for Consult: sinusitis concern for BPPV   HPI: Discussed the use of AI scribe software for clinical note transcription with the patient, who gave verbal consent to proceed.  History of Present Illness Glen [name] is a 58 year old male who presents with chronic sinus issues and dizziness/vesrtigo. He was referred by his primary care doctor for evaluation of chronic sinus issues.  He experiences chronic sinus issues characterized by nasal congestion, facial pain, and pressure, describing the pain as feeling like the roots of his teeth are exposed. He has sinus infections multiple times a year. He uses Tylenol  Sinus daily and Flonase  approximately five days a week, noting that Flonase  provides inconsistent relief.  He experiences dizziness described as feeling like 'moonwalking' and lightheadedness, although not as severe as previously. Vertigo has been ruled out through prior testing. He has not been tested for allergies and is not currently on any prescribed allergy medications.   Records Reviewed:  PCP note from 09/09/23 Sinusitis (Ongoing sinusitis with inner ear issues (ear fullness, inability to pop ears, more prominent in left ear). No discharge or pain from ears. This resulting in lightheadedness/dizziness. Notes cool feeling in ear for the last couple of days.)   Glen [name] presents for ongoing sinusitis with inner ear issues (ear fullness, inability to pop ears, more prominent in left ear). No discharge or pain from ears. This resulting in lightheadedness/dizziness. Notes cool feeling in ear for the last couple of days.   Pt took Z pac, then Cefdinir  1 month ago up to 2 weeks ago He felt dizzy twice in the shower, last time - this am x 10 min  Also diagnosed with BPPV   Past Medical History:  Diagnosis Date   Allergic rhinitis    Anxiety    Arthritis    Asthmatic bronchitis    Barrett esophagus    Dry eyes    GERD (gastroesophageal  reflux disease)    Headache    prone to migraines   Hepatitis B infection 2008   recovered   Hx of adenomatous polyp of colon 2012   Hypertension    Hypothyroidism 2010   Dr. Gaither Music, been tested since and this is an inacurrate diagnosis   Internal hemorrhoids    MRSA infection    (15 years ago, left neck area)   PONV (postoperative nausea and vomiting)    Ulcerative proctitis (HCC)    Urethritis 2010   Vitamin D  deficiency     Past Surgical History:  Procedure Laterality Date   ANTERIOR CERVICAL DECOMP/DISCECTOMY FUSION N/A 08/31/2020   Procedure: ANTERIOR CERVICAL DECOMPRESSION/DISCECTOMY FUSION, INTERBODY PROSTHESIS, PLATE/SCREWS CERVICAL FIVE-SIX, CERVICAL SIX-SEVEN;  Surgeon: Mavis Purchase, MD;  Location: [facility];  Service: Neurosurgery;  Laterality: N/A;  ANTERIOR CERVICAL DECOMPRESSION/DISCECTOMY FUSION, INTERBODY PROSTHESIS, PLATE/SCREWS CERVICAL FIVE-SIX, CERVICAL SIX-SEVEN   COLONOSCOPY     multiple - adenoma 2012   ESOPHAGOGASTRODUODENOSCOPY     multiple - barrett's   FACIAL COSMETIC SURGERY     HEMORRHOID BANDING     [place]   LIPOSUCTION     TEAR DUCT PROBING  2009   UMBILICAL HERNIA REPAIR N/A 11/09/2021   Procedure: OPEN UMBILICAL HERNIA REPAIR WITH MESH;  Surgeon: Vernetta Berg, MD;  Location: [facility];  Service: General;  Laterality: N/A;    Family History  Problem Relation Age of Onset   Stroke Mother    Lupus Mother    Arthritis Mother    Heart disease Paternal  Uncle 70       atrial fib   Arthritis Maternal Grandmother    Heart disease Paternal Grandfather 24       MI   Depression Other    Hypertension Other    Stroke Other    Pancreatic cancer Other    Liver cancer Other    Colon cancer Neg Hx    Esophageal cancer Neg Hx    Stomach cancer Neg Hx    Rectal cancer Neg Hx     Social History:  reports that he quit smoking about 24 years ago. His smoking use included cigarettes. He has never used smokeless tobacco. He  reports current alcohol use. He reports that he does not use drugs.  Allergies:  Allergies  Allergen Reactions   Codeine Other (See Comments)    Makes me really sick, I got hallucinations when the dose was really high 08/13/20 Pt also states he is very sensitive to strong pain medications   Hydrocodone Nausea And Vomiting   Mounjaro  [Tirzepatide ]     Dizziness and nausea   Penicillins Other (See Comments)    I don't remember, but mom said don't take 08/13/20   Sudafed [Pseudoephedrine Hcl]     palpitations    Medications: I have reviewed the patient's current medications.  The PMH, PSH, Medications, Allergies, and SH were reviewed and updated.  ROS: Constitutional: Negative for fever, weight loss and weight gain. Cardiovascular: Negative for chest pain and dyspnea on exertion. Respiratory: Is not experiencing shortness of breath at rest. Gastrointestinal: Negative for nausea and vomiting. Neurological: Negative for headaches. Psychiatric: The patient is not nervous/anxious  Blood pressure (!) 143/79, pulse 75, SpO2 97%. There is no height or weight on file to calculate BMI.  PHYSICAL EXAM:  Exam: General: Well-developed, well-nourished Respiratory Respiratory effort: Equal inspiration and expiration without stridor Cardiovascular Peripheral Vascular: Warm extremities with equal color/perfusion Eyes: No nystagmus with equal extraocular motion bilaterally Neuro/Psych/Balance: Patient oriented to person, place, and time; Appropriate mood and affect; Gait is intact with no imbalance; Cranial nerves I-XII are intact Head and Face Inspection: Normocephalic and atraumatic without mass or lesion Palpation: Facial skeleton intact without bony stepoffs Salivary Glands: No mass or tenderness Facial Strength: Facial motility symmetric and full bilaterally ENT Pinna: External ear intact and fully developed External canal: Canal is patent with intact skin Tympanic Membrane:  Clear and mobile External Nose: No scar or anatomic deformity Internal Nose: Septum is slightly deviated to the left. No polyp, or purulence. Mucosal edema and erythema present.  Bilateral inferior turbinate hypertrophy.  Lips, Teeth, and gums: Mucosa and teeth intact and viable TMJ: No pain to palpation with full mobility Oral cavity/oropharynx: No erythema or exudate, no lesions present Nasopharynx: No mass or lesion with intact mucosa Neck Neck and Trachea: Midline trachea without mass or lesion Thyroid : No mass or nodularity Lymphatics: No lymphadenopathy  Procedure:   PROCEDURE NOTE: nasal endoscopy  Preoperative diagnosis: chronic nasal congestion symptoms  Postoperative diagnosis: same  Procedure: Diagnostic nasal endoscopy (68768)  Surgeon: Elena Larry, M.D.  Anesthesia: Topical lidocaine  and Afrin  H&P REVIEW: The patient's history and physical were reviewed today prior to procedure. All medications were reviewed and updated as well. Complications: None Condition is stable throughout exam Indications and consent: The patient presents with symptoms of chronic sinusitis not responding to previous therapies. All the risks, benefits, and potential complications were reviewed with the patient preoperatively and informed consent was obtained. The time out was completed with confirmation of  the correct procedure.   Procedure: The patient was seated upright in the clinic. Topical lidocaine  and Afrin were applied to the nasal cavity. After adequate anesthesia had occurred, the rigid nasal endoscope was passed into the nasal cavity. The nasal mucosa, turbinates, septum, and sinus drainage pathways were visualized bilaterally. This revealed no purulence or significant secretions that might be cultured. There were no polyps or sites of significant inflammation. The mucosa was intact and there was no crusting present. The scope was then slowly withdrawn and the patient tolerated the  procedure well. There were no complications or blood loss.    Assessment/Plan: Encounter Diagnoses  Name Primary?   Benign paroxysmal positional vertigo due to bilateral vestibular disorder    Dysfunction of both eustachian tubes    Chronic nasal congestion    Environmental and seasonal allergies    Post-nasal drip    Nasal obstruction    Nasal septal deviation    Hypertrophy of both inferior nasal turbinates    Chronic sinusitis, unspecified location Yes    Assessment and Plan Assessment & Plan Chronic sinusitis symptoms Chronic sinusitis with nasal congestion, facial pain, and pressure.  No polyps or purulent nasal discharge on nasal endoscopy. Left nasal passage slightly narrowed due to septal deviation. Differential diagnosis includes allergy-related nasal congestion vs CRS - Order facial CT of sinuses to assess for residual sinus inflammation. - Prescribed Flonase  nasal spray twice daily. - Prescribed Xyzal (levocetirizine) at night. - Consider allergy testing if CT scan is negative.   Eustachian tube dysfunction Eustachian tube dysfunction likely secondary to chronic nasal congestion and possible allergies. Symptoms include daily ear blockage. No evidence of vertigo, but vestibular issues may be present. - Provided handout on Eustachian tube dysfunction. - Advise discontinuation of Tylenol  sinus in favor of Xyzal. Flonase  BID every day  Vertigo resolved, but he has occasional off balance feeling - vestibular rehab referral placed      Thank you for allowing me to participate in the care of this patient. Please do not hesitate to contact me with any questions or concerns.   Elena Larry, MD Otolaryngology [facility] Phone: [phone] Fax: [phone]    12/12/2023, 11:01 AM

## 2023-12-12 NOTE — Addendum Note (Signed)
 Addended by: Sharicka Pogorzelski on: 12/12/2023 02:50 PM   Modules accepted: Orders

## 2023-12-12 NOTE — Patient Instructions (Signed)

## 2023-12-24 ENCOUNTER — Ambulatory Visit (HOSPITAL_COMMUNITY)
Admission: RE | Admit: 2023-12-24 | Discharge: 2023-12-24 | Disposition: A | Discharge status: Home / Self Care | Source: Ambulatory Visit | Attending: Otolaryngology | Admitting: Otolaryngology

## 2023-12-24 DIAGNOSIS — J329 Chronic sinusitis, unspecified: Secondary | ICD-10-CM | POA: Diagnosis present

## 2023-12-28 ENCOUNTER — Ambulatory Visit (HOSPITAL_BASED_OUTPATIENT_CLINIC_OR_DEPARTMENT_OTHER)

## 2023-12-30 ENCOUNTER — Institutional Professional Consult (permissible substitution) (INDEPENDENT_AMBULATORY_CARE_PROVIDER_SITE_OTHER): Admitting: Otolaryngology

## 2024-01-02 ENCOUNTER — Telehealth (INDEPENDENT_AMBULATORY_CARE_PROVIDER_SITE_OTHER): Payer: Self-pay | Admitting: Otolaryngology

## 2024-01-02 ENCOUNTER — Telehealth (INDEPENDENT_AMBULATORY_CARE_PROVIDER_SITE_OTHER): Payer: Self-pay

## 2024-01-02 NOTE — Telephone Encounter (Signed)
 Spoke to patient regarding CT scan results. Patient understood. Patient explained that he is still having issues with his ears popping. I provided advise based on Dr. Vallery note. Dr. Soldatova also said that he should do daily nasal saline rinses twice a day. She also said that she would send him to allergy if he would like allergy testing since that may also be related.

## 2024-01-02 NOTE — Telephone Encounter (Signed)
 Patient called in regards to ct results. Patient stated he hasn't heard anything, please advise.

## 2024-03-03 ENCOUNTER — Ambulatory Visit: Admitting: Internal Medicine

## 2024-04-20 ENCOUNTER — Ambulatory Visit: Admitting: Internal Medicine

## 2024-04-20 ENCOUNTER — Encounter: Payer: Self-pay | Admitting: Internal Medicine

## 2024-04-20 VITALS — BP 142/80 | HR 68 | Temp 98.2°F | Ht 73.0 in | Wt 225.0 lb

## 2024-04-20 DIAGNOSIS — J3089 Other allergic rhinitis: Secondary | ICD-10-CM | POA: Diagnosis not present

## 2024-04-20 DIAGNOSIS — I10 Essential (primary) hypertension: Secondary | ICD-10-CM | POA: Diagnosis not present

## 2024-04-20 DIAGNOSIS — Z0000 Encounter for general adult medical examination without abnormal findings: Secondary | ICD-10-CM | POA: Diagnosis not present

## 2024-04-20 DIAGNOSIS — E559 Vitamin D deficiency, unspecified: Secondary | ICD-10-CM | POA: Diagnosis not present

## 2024-04-20 DIAGNOSIS — R739 Hyperglycemia, unspecified: Secondary | ICD-10-CM

## 2024-04-20 DIAGNOSIS — F419 Anxiety disorder, unspecified: Secondary | ICD-10-CM

## 2024-04-20 DIAGNOSIS — E785 Hyperlipidemia, unspecified: Secondary | ICD-10-CM

## 2024-04-20 DIAGNOSIS — R7303 Prediabetes: Secondary | ICD-10-CM

## 2024-04-20 DIAGNOSIS — R002 Palpitations: Secondary | ICD-10-CM

## 2024-04-20 LAB — URINALYSIS
Bilirubin Urine: NEGATIVE
Hgb urine dipstick: NEGATIVE
Ketones, ur: NEGATIVE
Leukocytes,Ua: NEGATIVE
Nitrite: NEGATIVE
Specific Gravity, Urine: <=1.005 — AB (ref 1.000–1.030)
Total Protein, Urine: NEGATIVE
Urine Glucose: NEGATIVE
Urobilinogen, UA: 0.2 (ref 0.0–1.0)
pH: 6 (ref 5.0–8.0)

## 2024-04-20 LAB — COMPREHENSIVE METABOLIC PANEL WITH GFR
ALT: 19 U/L (ref 0–53)
AST: 21 U/L (ref 0–37)
Albumin: 4.6 g/dL (ref 3.5–5.2)
Alkaline Phosphatase: 59 U/L (ref 39–117)
BUN: 14 mg/dL (ref 6–23)
CO2: 30 meq/L (ref 19–32)
Calcium: 9.6 mg/dL (ref 8.4–10.5)
Chloride: 102 meq/L (ref 96–112)
Creatinine, Ser: 0.96 mg/dL (ref 0.40–1.50)
GFR: 86.73 mL/min (ref >60.00)
Glucose, Bld: 85 mg/dL (ref 70–99)
Potassium: 4.5 meq/L (ref 3.5–5.1)
Sodium: 139 meq/L (ref 135–145)
Total Bilirubin: 0.7 mg/dL (ref 0.2–1.2)
Total Protein: 7.1 g/dL (ref 6.0–8.3)

## 2024-04-20 LAB — CBC WITH DIFFERENTIAL/PLATELET
Basophils Absolute: 0 K/uL (ref 0.0–0.1)
Basophils Relative: 0.7 % (ref 0.0–3.0)
Eosinophils Absolute: 0.1 K/uL (ref 0.0–0.7)
Eosinophils Relative: 2.2 % (ref 0.0–5.0)
HCT: 46.4 % (ref 39.0–52.0)
Hemoglobin: 15.8 g/dL (ref 13.0–17.0)
Lymphocytes Relative: 31.8 % (ref 12.0–46.0)
Lymphs Abs: 1.5 K/uL (ref 0.7–4.0)
MCHC: 34.2 g/dL (ref 30.0–36.0)
MCV: 90.4 fl (ref 78.0–100.0)
Monocytes Absolute: 0.3 K/uL (ref 0.1–1.0)
Monocytes Relative: 7.4 % (ref 3.0–12.0)
Neutro Abs: 2.7 K/uL (ref 1.4–7.7)
Neutrophils Relative %: 57.9 % (ref 43.0–77.0)
Platelets: 231 K/uL (ref 150.0–400.0)
RBC: 5.13 Mil/uL (ref 4.22–5.81)
RDW: 13.3 % (ref 11.5–15.5)
WBC: 4.7 K/uL (ref 4.0–10.5)

## 2024-04-20 LAB — CORTISOL: Cortisol, Plasma: 9.5 ug/dL

## 2024-04-20 LAB — TSH: TSH: 1.62 u[IU]/mL (ref 0.35–5.50)

## 2024-04-20 LAB — VITAMIN D 25 HYDROXY (VIT D DEFICIENCY, FRACTURES): VITD: 45.46 ng/mL (ref 30.00–100.00)

## 2024-04-20 LAB — T4, FREE: Free T4: 0.77 ng/dL (ref 0.60–1.60)

## 2024-04-20 LAB — HEMOGLOBIN A1C: Hgb A1c MFr Bld: 5.6 % (ref 4.6–6.5)

## 2024-04-20 LAB — VITAMIN B12: Vitamin B-12: 284 pg/mL (ref 211–911)

## 2024-04-20 LAB — LIPID PANEL
Cholesterol: 245 mg/dL — ABNORMAL HIGH (ref 0–200)
HDL: 59.2 mg/dL (ref >39.00)
LDL Cholesterol: 168 mg/dL — ABNORMAL HIGH (ref 0–99)
NonHDL: 186.09
Total CHOL/HDL Ratio: 4
Triglycerides: 91 mg/dL (ref 0.0–149.0)
VLDL: 18.2 mg/dL (ref 0.0–40.0)

## 2024-04-20 LAB — TESTOSTERONE: Testosterone: 347.09 ng/dL (ref 300.00–890.00)

## 2024-04-20 LAB — PSA: PSA: 1.45 ng/mL (ref 0.10–4.00)

## 2024-04-20 MED ORDER — AZELASTINE-FLUTICASONE 137-50 MCG/ACT NA SUSP: 1.0000 | Freq: Two times a day (BID) | NASAL | 3 refills | Status: AC

## 2024-04-20 MED ORDER — LEVOCETIRIZINE DIHYDROCHLORIDE 5 MG PO TABS: 5.0000 mg | ORAL_TABLET | Freq: Every evening | ORAL | 3 refills | Status: AC

## 2024-04-20 NOTE — Assessment & Plan Note (Signed)
 On Xyzal  Start Dymista

## 2024-04-20 NOTE — Assessment & Plan Note (Signed)
 Off Rx Check labs

## 2024-04-20 NOTE — Progress Notes (Signed)
 Subjective:  Patient ID: Glen [name], male    DOB: 08-14-1965  Age: 58 y.o. MRN: [id number]  CC: Annual Exam (Annual Exam. Recent bouts of fatigue (x 1 year), interested in getting vitamin deficiencies checked as well as cortisol levels)   HPI [name] presents for a well exam C/o wt gain, fatigue Off Tirzepatide   compound due to low sugars     Outpatient Medications Prior to Visit  Medication Sig Dispense Refill   ALPRAZolam  (XANAX ) 0.25 MG tablet Take 1 tablet (0.25 mg total) by mouth 2 (two) times daily as needed for anxiety (Panic attacks). 60 tablet 1   aspirin-acetaminophen -caffeine (EXCEDRIN MIGRAINE) 250-250-65 MG tablet Take 1 tablet by mouth as needed for headache.     cefdinir  (OMNICEF ) 300 MG capsule Take 1 capsule (300 mg total) by mouth 2 (two) times daily. 20 capsule 0   diclofenac  Sodium (VOLTAREN ) 1 % GEL Apply 2 g topically as needed.     fluticasone  (FLONASE ) 50 MCG/ACT nasal spray Place 2 sprays into both nostrils 2 (two) times daily. 16 g 6   LUMIFY 0.025 % SOLN SMARTSIG:1 Drop(s) In Eye(s) PRN     mesalamine  (CANASA ) 1000 MG suppository Place 1 suppository (1,000 mg total) rectally at bedtime. (Patient taking differently: Place 1,000 mg rectally as needed.) 30 suppository 0   methylPREDNISolone  (MEDROL  DOSEPAK) 4 MG TBPK tablet As directed 21 tablet 0   ondansetron  (ZOFRAN -ODT) 4 MG disintegrating tablet Take 4 mg by mouth every 8 (eight) hours as needed.     SUMAtriptan  (IMITREX ) 20 MG/ACT nasal spray Place 1 spray (20 mg total) into the nose every 2 (two) hours as needed for migraine or headache. May repeat in 2 hours if headache persists or recurs. 1 each 5   zolmitriptan  (ZOMIG ) 5 MG tablet Take 1 tablet (5 mg total) by mouth as needed for migraine. 10 tablet 3   citalopram  (CELEXA ) 20 MG tablet Take 1/2 (one-half) tablet by mouth once daily 30 tablet 5   levocetirizine (XYZAL  ALLERGY 24HR) 5 MG tablet Take 1 tablet (5 mg total) by mouth every evening.  30 tablet 3   valACYclovir  (VALTREX ) 500 MG tablet TAKE 1 TABLET BY MOUTH TWICE DAILY AS NEEDED FOR FEVER BLISTERS FOR 5 DAYS 30 tablet 0   celecoxib  (CELEBREX ) 200 MG capsule Take 1 capsule (200 mg total) by mouth daily as needed. 30 capsule 2   metoprolol  succinate (TOPROL  XL) 25 MG 24 hr tablet Take 1 tablet (25 mg total) by mouth daily. Take at bedtime. 90 tablet 1   omeprazole  (PRILOSEC) 40 MG capsule Take 1 capsule (40 mg total) by mouth daily. 90 capsule 3   pantoprazole  (PROTONIX ) 40 MG tablet Take 1 tablet (40 mg total) by mouth 2 (two) times daily before a meal. 60 tablet 11   Semaglutide -Weight Management (WEGOVY [place]) Inject 0.125 mg into the skin once a week. Compound version-     No facility-administered medications prior to visit.    ROS: Review of Systems  Constitutional:  Negative for appetite change, fatigue and unexpected weight change.  HENT:  Negative for congestion, nosebleeds, sneezing, sore throat and trouble swallowing.   Eyes:  Negative for itching and visual disturbance.  Respiratory:  Negative for cough.   Cardiovascular:  Negative for chest pain, palpitations and leg swelling.  Gastrointestinal:  Negative for abdominal distention, blood in stool, diarrhea and nausea.  Genitourinary:  Negative for frequency and hematuria.  Musculoskeletal:  Negative for back pain, gait problem, joint  swelling and neck pain.  Skin:  Negative for rash.  Neurological:  Negative for dizziness, tremors, speech difficulty and weakness.  Psychiatric/Behavioral:  Negative for agitation, dysphoric mood, sleep disturbance and suicidal ideas. The patient is not nervous/anxious.     Objective:  BP (!) 142/80   Pulse 68   Temp 98.2 F (36.8 C)   Ht 6' 1 (1.854 m)   Wt 225 lb (102.1 kg)   SpO2 99%   BMI 29.69 kg/m   BP Readings from Last 3 Encounters:  05/04/24 (!) 142/80  04/20/24 (!) 142/80  12/12/23 (!) 143/79    Wt Readings from Last 3 Encounters:  05/04/24 226 lb 3.2 oz  (102.6 kg)  04/20/24 225 lb (102.1 kg)  09/09/23 223 lb 9.6 oz (101.4 kg)    Physical Exam Constitutional:      General: He is not in acute distress.    Appearance: Normal appearance. He is well-developed.     Comments: NAD  Eyes:     Conjunctiva/sclera: Conjunctivae normal.     Pupils: Pupils are equal, round, and reactive to light.  Neck:     Thyroid : No thyromegaly.     Vascular: No JVD.  Cardiovascular:     Rate and Rhythm: Normal rate and regular rhythm.     Heart sounds: Normal heart sounds. No murmur heard.    No friction rub. No gallop.  Pulmonary:     Effort: Pulmonary effort is normal. No respiratory distress.     Breath sounds: Normal breath sounds. No wheezing or rales.  Chest:     Chest wall: No tenderness.  Abdominal:     General: Bowel sounds are normal. There is no distension.     Palpations: Abdomen is soft. There is no mass.     Tenderness: There is no abdominal tenderness. There is no guarding or rebound.  Musculoskeletal:        General: No tenderness. Normal range of motion.     Cervical back: Normal range of motion.  Lymphadenopathy:     Cervical: No cervical adenopathy.  Skin:    General: Skin is warm and dry.     Findings: No rash.  Neurological:     Mental Status: He is alert and oriented to person, place, and time.     Cranial Nerves: No cranial nerve deficit.     Motor: No abnormal muscle tone.     Coordination: Coordination normal.     Gait: Gait normal.     Deep Tendon Reflexes: Reflexes are normal and symmetric.  Psychiatric:        Behavior: Behavior normal.        Thought Content: Thought content normal.        Judgment: Judgment normal.     Lab Results  Component Value Date   WBC 4.7 04/20/2024   HGB 15.8 04/20/2024   HCT 46.4 04/20/2024   PLT 231.0 04/20/2024   GLUCOSE 85 04/20/2024   CHOL 245 (H) 04/20/2024   TRIG 91.0 04/20/2024   HDL 59.20 04/20/2024   LDLDIRECT 150.6 06/29/2013   LDLCALC 168 (H) 04/20/2024   ALT 19  04/20/2024   AST 21 04/20/2024   NA 139 04/20/2024   K 4.5 04/20/2024   CL 102 04/20/2024   CREATININE 0.96 04/20/2024   BUN 14 04/20/2024   CO2 30 04/20/2024   TSH 1.62 04/20/2024   PSA 1.45 04/20/2024   HGBA1C 5.6 04/20/2024    CT Maxillofacial WO CM Result Date: 12/25/2023 CLINICAL DATA:  chronic sinusitis EXAM: CT MAXILLOFACIAL WITHOUT CONTRAST TECHNIQUE: Multidetector CT imaging of the maxillofacial structures was performed. Multiplanar CT image reconstructions were also generated. RADIATION DOSE REDUCTION: This exam was performed according to the departmental dose-optimization program which includes automated exposure control, adjustment of the mA and/or kV according to patient size and/or use of iterative reconstruction technique. COMPARISON:  None Available. FINDINGS: Osseous: No fracture or mandibular dislocation. No destructive process. Orbits: Negative. No traumatic or inflammatory finding. Sinuses: Mild mucosal disease within the maxillary sinuses bilaterally. There are bilateral Haller air cells. There is mild mucoperiosteal thickening obstructing the left ostiomeatal complex. There is mucoperiosteal thickening abutting the right ostiomeatal complex, but nonobstructing. The frontal, ethmoid and sphenoid sinuses and the frontoethmoidal recesses and sphenoethmoidal recesses are patent. Soft tissues: Negative. Limited intracranial: Unremarkable. IMPRESSION: 1. Mild circumferential mucosal disease within the maxillary sinuses with occlusion of the left ostiomeatal complex. 2. Bilateral Haller air cells. Electronically Signed   By: Evalene Coho M.D.   On: 12/25/2023 21:29    Assessment & Plan:   Problem List Items Addressed This Visit     Allergic rhinitis   On Xyzal  Start Dymista       Anxiety disorder   Dyslipidemia   2022 - Coronary calcium  CT score of 0       HTN (hypertension)   Relevant Orders   T4, free (Completed)   Cortisol (Completed)   Vitamin B12 (Completed)    VITAMIN D  25 Hydroxy (Vit-D Deficiency, Fractures) (Completed)   Hyperglycemia   Relevant Orders   Hemoglobin A1c (Completed)   Palpitations   Prediabetes   Off Tirzepatide   compound due to low sugars Check A1c      Vitamin D  deficiency   Off Rx Check labs      Relevant Orders   VITAMIN D  25 Hydroxy (Vit-D Deficiency, Fractures) (Completed)   Well adult exam - Primary    Last colonoscopy with (Dr Luis - 2019), Dr. Avram 2022, repeat colonoscopy in 2027 A cardiac CT scan for calcium  scoring offered 9/20. 2022 - Coronary calcium  CT score of 0  Used to run up to 12 mi -not running  We discussed age appropriate health related issues, including available/recomended screening tests and vaccinations. Labs were ordered to be later reviewed . All questions were answered. We discussed one or more of the following - seat belt use, use of sunscreen/sun exposure exercise, safe sex, fall risk reduction, second hand smoke exposure, firearm use and storage, seat belt use, a need for adhering to healthy diet and exercise. Labs were ordered.  All questions were answered.      Relevant Orders   TSH (Completed)   Urinalysis (Completed)   CBC with Differential/Platelet (Completed)   Lipid panel (Completed)   PSA (Completed)   Comprehensive metabolic panel with GFR (Completed)   Testosterone  (Completed)   T4, free (Completed)   Cortisol (Completed)   Vitamin B12 (Completed)   VITAMIN D  25 Hydroxy (Vit-D Deficiency, Fractures) (Completed)      Meds ordered this encounter  Medications   levocetirizine (XYZAL  ALLERGY 24HR) 5 MG tablet    Sig: Take 1 tablet (5 mg total) by mouth every evening.    Dispense:  90 tablet    Refill:  3   Azelastine -Fluticasone  137-50 MCG/ACT SUSP    Sig: Place 1 spray into the nose every 12 (twelve) hours.    Dispense:  69 g    Refill:  3      Follow-up: Return in about 6 months (  around 10/18/2024) for a follow-up visit.  Marolyn Noel, MD

## 2024-04-20 NOTE — Assessment & Plan Note (Signed)
 Off Tirzepatide   compound due to low sugars Check A1c

## 2024-04-21 ENCOUNTER — Other Ambulatory Visit: Payer: Self-pay

## 2024-04-21 DIAGNOSIS — K219 Gastro-esophageal reflux disease without esophagitis: Secondary | ICD-10-CM

## 2024-04-21 MED ORDER — OMEPRAZOLE 40 MG PO CPDR
40.0000 mg | DELAYED_RELEASE_CAPSULE | Freq: Every day | ORAL | 3 refills | Status: DC
Start: 2024-04-21 — End: 2024-04-22

## 2024-04-22 ENCOUNTER — Ambulatory Visit: Payer: Self-pay | Admitting: Internal Medicine

## 2024-04-22 ENCOUNTER — Other Ambulatory Visit: Payer: Self-pay | Admitting: Internal Medicine

## 2024-04-22 DIAGNOSIS — K219 Gastro-esophageal reflux disease without esophagitis: Secondary | ICD-10-CM

## 2024-04-22 MED ORDER — CITALOPRAM HYDROBROMIDE 20 MG PO TABS: ORAL_TABLET | ORAL | 3 refills | Status: DC

## 2024-04-22 MED ORDER — OMEPRAZOLE 40 MG PO CPDR: 40.0000 mg | DELAYED_RELEASE_CAPSULE | Freq: Every day | ORAL | 3 refills | Status: AC

## 2024-04-27 ENCOUNTER — Other Ambulatory Visit: Payer: Self-pay | Admitting: Internal Medicine

## 2024-05-04 ENCOUNTER — Ambulatory Visit: Admitting: Internal Medicine

## 2024-05-04 ENCOUNTER — Encounter: Payer: Self-pay | Admitting: Internal Medicine

## 2024-05-04 VITALS — BP 142/80 | HR 79 | Ht 73.0 in | Wt 226.2 lb

## 2024-05-04 DIAGNOSIS — I10 Essential (primary) hypertension: Secondary | ICD-10-CM | POA: Diagnosis not present

## 2024-05-04 DIAGNOSIS — J3089 Other allergic rhinitis: Secondary | ICD-10-CM

## 2024-05-04 DIAGNOSIS — E785 Hyperlipidemia, unspecified: Secondary | ICD-10-CM

## 2024-05-04 MED ORDER — ICOSAPENT ETHYL 1 G PO CAPS
2.0000 g | ORAL_CAPSULE | Freq: Two times a day (BID) | ORAL | 11 refills | Status: AC
Start: 2024-05-04 — End: ?

## 2024-05-04 MED ORDER — ROSUVASTATIN CALCIUM 5 MG PO TABS: 5.0000 mg | ORAL_TABLET | Freq: Every day | ORAL | 11 refills | Status: DC

## 2024-05-04 NOTE — Progress Notes (Signed)
 Subjective:  Patient ID: Glen [name], male    DOB: 12-02-1965  Age: 58 y.o. MRN: [id number]  CC: Medical Management of Chronic Issues (Hyperlipidemia and medication options)   HPI Glen [name] presents for dyslipidemia, allergies, hypertension  Outpatient Medications Prior to Visit  Medication Sig Dispense Refill   ALPRAZolam  (XANAX ) 0.25 MG tablet Take 1 tablet (0.25 mg total) by mouth 2 (two) times daily as needed for anxiety (Panic attacks). 60 tablet 1   aspirin-acetaminophen -caffeine (EXCEDRIN MIGRAINE) 250-250-65 MG tablet Take 1 tablet by mouth as needed for headache.     Azelastine -Fluticasone  137-50 MCG/ACT SUSP Place 1 spray into the nose every 12 (twelve) hours. 69 g 3   cefdinir  (OMNICEF ) 300 MG capsule Take 1 capsule (300 mg total) by mouth 2 (two) times daily. 20 capsule 0   citalopram  (CELEXA ) 20 MG tablet Take 1/2 (one-half) tablet by mouth once daily 90 tablet 3   diclofenac  Sodium (VOLTAREN ) 1 % GEL Apply 2 g topically as needed.     fluticasone  (FLONASE ) 50 MCG/ACT nasal spray Place 2 sprays into both nostrils 2 (two) times daily. 16 g 6   levocetirizine (XYZAL  ALLERGY 24HR) 5 MG tablet Take 1 tablet (5 mg total) by mouth every evening. 90 tablet 3   LUMIFY 0.025 % SOLN SMARTSIG:1 Drop(s) In Eye(s) PRN     mesalamine  (CANASA ) 1000 MG suppository Place 1 suppository (1,000 mg total) rectally at bedtime. (Patient taking differently: Place 1,000 mg rectally as needed.) 30 suppository 0   methylPREDNISolone  (MEDROL  DOSEPAK) 4 MG TBPK tablet As directed 21 tablet 0   omeprazole  (PRILOSEC) 40 MG capsule Take 1 capsule (40 mg total) by mouth daily. 90 capsule 3   ondansetron  (ZOFRAN -ODT) 4 MG disintegrating tablet Take 4 mg by mouth every 8 (eight) hours as needed.     SUMAtriptan  (IMITREX ) 20 MG/ACT nasal spray Place 1 spray (20 mg total) into the nose every 2 (two) hours as needed for migraine or headache. May repeat in 2 hours if headache persists or recurs. 1 each 5    valACYclovir  (VALTREX ) 500 MG tablet TAKE 1 TABLET BY MOUTH TWICE DAILY AS NEEDED FOR  FEVER  BLISTERS.  NEEDS  APPOINTMENT  FOR  MORE  REFILLS 30 tablet 0   zolmitriptan  (ZOMIG ) 5 MG tablet Take 1 tablet (5 mg total) by mouth as needed for migraine. 10 tablet 3   No facility-administered medications prior to visit.    ROS: Review of Systems  Constitutional:  Negative for appetite change, fatigue and unexpected weight change.  HENT:  Negative for congestion, nosebleeds, sneezing, sore throat and trouble swallowing.   Eyes:  Negative for itching and visual disturbance.  Respiratory:  Negative for cough.   Cardiovascular:  Negative for chest pain, palpitations and leg swelling.  Gastrointestinal:  Negative for abdominal distention, blood in stool, diarrhea and nausea.  Genitourinary:  Negative for frequency and hematuria.  Musculoskeletal:  Negative for back pain, gait problem, joint swelling and neck pain.  Skin:  Negative for rash.  Neurological:  Negative for dizziness, tremors, speech difficulty and weakness.  Psychiatric/Behavioral:  Negative for agitation, dysphoric mood and sleep disturbance. The patient is not nervous/anxious.     Objective:  BP (!) 142/80   Pulse 79   Ht 6' 1 (1.854 m)   Wt 226 lb 3.2 oz (102.6 kg)   SpO2 98%   BMI 29.84 kg/m   BP Readings from Last 3 Encounters:  05/04/24 (!) 142/80  04/20/24 (!) 142/80  12/12/23 (!) 143/79    Wt Readings from Last 3 Encounters:  05/04/24 226 lb 3.2 oz (102.6 kg)  04/20/24 225 lb (102.1 kg)  09/09/23 223 lb 9.6 oz (101.4 kg)    Physical Exam Constitutional:      General: He is not in acute distress.    Appearance: He is well-developed. He is obese.     Comments: NAD  Eyes:     Conjunctiva/sclera: Conjunctivae normal.     Pupils: Pupils are equal, round, and reactive to light.  Neck:     Thyroid : No thyromegaly.     Vascular: No JVD.  Cardiovascular:     Rate and Rhythm: Normal rate and regular rhythm.      Heart sounds: Normal heart sounds. No murmur heard.    No friction rub. No gallop.  Pulmonary:     Effort: Pulmonary effort is normal. No respiratory distress.     Breath sounds: Normal breath sounds. No wheezing or rales.  Chest:     Chest wall: No tenderness.  Abdominal:     General: Bowel sounds are normal. There is no distension.     Palpations: Abdomen is soft. There is no mass.     Tenderness: There is no abdominal tenderness. There is no guarding or rebound.  Musculoskeletal:        General: No tenderness. Normal range of motion.     Cervical back: Normal range of motion.  Lymphadenopathy:     Cervical: No cervical adenopathy.  Skin:    General: Skin is warm and dry.     Findings: No rash.  Neurological:     Mental Status: He is alert and oriented to person, place, and time.     Cranial Nerves: No cranial nerve deficit.     Motor: No abnormal muscle tone.     Coordination: Coordination normal.     Gait: Gait normal.     Deep Tendon Reflexes: Reflexes are normal and symmetric.  Psychiatric:        Behavior: Behavior normal.        Thought Content: Thought content normal.        Judgment: Judgment normal.     Lab Results  Component Value Date   WBC 4.7 04/20/2024   HGB 15.8 04/20/2024   HCT 46.4 04/20/2024   PLT 231.0 04/20/2024   GLUCOSE 85 04/20/2024   CHOL 245 (H) 04/20/2024   TRIG 91.0 04/20/2024   HDL 59.20 04/20/2024   LDLDIRECT 150.6 06/29/2013   LDLCALC 168 (H) 04/20/2024   ALT 19 04/20/2024   AST 21 04/20/2024   NA 139 04/20/2024   K 4.5 04/20/2024   CL 102 04/20/2024   CREATININE 0.96 04/20/2024   BUN 14 04/20/2024   CO2 30 04/20/2024   TSH 1.62 04/20/2024   PSA 1.45 04/20/2024   HGBA1C 5.6 04/20/2024    CT Maxillofacial WO CM Result Date: 12/25/2023 CLINICAL DATA:  chronic sinusitis EXAM: CT MAXILLOFACIAL WITHOUT CONTRAST TECHNIQUE: Multidetector CT imaging of the maxillofacial structures was performed. Multiplanar CT image reconstructions  were also generated. RADIATION DOSE REDUCTION: This exam was performed according to the departmental dose-optimization program which includes automated exposure control, adjustment of the mA and/or kV according to patient size and/or use of iterative reconstruction technique. COMPARISON:  None Available. FINDINGS: Osseous: No fracture or mandibular dislocation. No destructive process. Orbits: Negative. No traumatic or inflammatory finding. Sinuses: Mild mucosal disease within the maxillary sinuses bilaterally. There are bilateral Haller air cells. There is mild  mucoperiosteal thickening obstructing the left ostiomeatal complex. There is mucoperiosteal thickening abutting the right ostiomeatal complex, but nonobstructing. The frontal, ethmoid and sphenoid sinuses and the frontoethmoidal recesses and sphenoethmoidal recesses are patent. Soft tissues: Negative. Limited intracranial: Unremarkable. IMPRESSION: 1. Mild circumferential mucosal disease within the maxillary sinuses with occlusion of the left ostiomeatal complex. 2. Bilateral Haller air cells. Electronically Signed   By: Evalene Coho M.D.   On: 12/25/2023 21:29    Assessment & Plan:   Problem List Items Addressed This Visit     Allergic rhinitis   On Xyzal  Dymista       Dyslipidemia   We discussed that she is 2022 - Coronary calcium  CT score of 0.  We have an option to treat his lipids.  We can try Vascepa  if covered Lemond is interested in trying Crestor  - we will start 5 mg a day.  Risks/benefits of primary coronary disease prophylaxis with statins were discussed.      Relevant Medications   rosuvastatin  (CRESTOR ) 5 MG tablet   icosapent  Ethyl (VASCEPA ) 1 g capsule   HTN (hypertension) - Primary   Continue with Diovan .  Weight loss effort.  No added salt diet      Relevant Medications   rosuvastatin  (CRESTOR ) 5 MG tablet   icosapent  Ethyl (VASCEPA ) 1 g capsule   Other Relevant Orders   Lipid panel   Comprehensive metabolic  panel with GFR      Meds ordered this encounter  Medications   rosuvastatin  (CRESTOR ) 5 MG tablet    Sig: Take 1 tablet (5 mg total) by mouth daily.    Dispense:  30 tablet    Refill:  11   icosapent  Ethyl (VASCEPA ) 1 g capsule    Sig: Take 2 capsules (2 g total) by mouth 2 (two) times daily.    Dispense:  120 capsule    Refill:  11      Follow-up: Return in about 6 months (around 11/01/2024) for a follow-up visit.  Marolyn Noel, MD

## 2024-05-04 NOTE — Assessment & Plan Note (Signed)
Continue with Diovan.  Weight loss effort.  No added salt diet

## 2024-05-05 ENCOUNTER — Ambulatory Visit: Admitting: Internal Medicine

## 2024-05-05 ENCOUNTER — Other Ambulatory Visit (HOSPITAL_COMMUNITY): Payer: Self-pay

## 2024-05-05 ENCOUNTER — Telehealth: Payer: Self-pay

## 2024-05-05 NOTE — Telephone Encounter (Signed)
 Pharmacy Patient Advocate Encounter   Received notification from Onbase that prior authorization for Icosapent Ethyl 1GM capsules  is required/requested.   Insurance verification completed.   The patient is insured through [employer].   Per test claim: PA required; PA started via CoverMyMeds. KEY [id number] . Please see clinical question(s) below that I am not finding the answer to in their chart and advise.

## 2024-05-10 NOTE — Assessment & Plan Note (Signed)
 On Xyzal  Dymista 

## 2024-05-10 NOTE — Assessment & Plan Note (Signed)
 We discussed that she is 2022 - Coronary calcium  CT score of 0.  We have an option to treat his lipids.  We can try Vascepa  if covered Lemond is interested in trying Crestor  - we will start 5 mg a day.  Risks/benefits of primary coronary disease prophylaxis with statins were discussed.

## 2024-05-17 NOTE — Assessment & Plan Note (Addendum)
  Last colonoscopy with (Dr Luis - 2019), Dr. Avram 2022, repeat colonoscopy in 2027 A cardiac CT scan for calcium  scoring offered 9/20. 2022 - Coronary calcium  CT score of 0  Used to run up to 12 mi -not running  We discussed age appropriate health related issues, including available/recomended screening tests and vaccinations. Labs were ordered to be later reviewed . All questions were answered. We discussed one or more of the following - seat belt use, use of sunscreen/sun exposure exercise, safe sex, fall risk reduction, second hand smoke exposure, firearm use and storage, seat belt use, a need for adhering to healthy diet and exercise. Labs were ordered.  All questions were answered.

## 2024-05-17 NOTE — Assessment & Plan Note (Signed)
 2022 - Coronary calcium  CT score of 0

## 2024-05-20 NOTE — Telephone Encounter (Signed)
 PA expired. Closing encounter.

## 2024-05-21 ENCOUNTER — Other Ambulatory Visit: Payer: Self-pay

## 2024-05-21 DIAGNOSIS — E785 Hyperlipidemia, unspecified: Secondary | ICD-10-CM

## 2024-05-21 MED ORDER — ROSUVASTATIN CALCIUM 5 MG PO TABS: 5.0000 mg | ORAL_TABLET | Freq: Every day | ORAL | 1 refills | Status: DC

## 2024-06-12 ENCOUNTER — Telehealth: Payer: Self-pay

## 2024-06-12 NOTE — Telephone Encounter (Signed)
 Copied from CRM #[id number]. Topic: General - Other >> Jun 12, 2024  2:52 PM Anairis L wrote: Reason for CRM: Patient is requesting a call back from someone with knowledge regarding which insurance Dr.Plotnikov accepts. We informed him he is in network with [facility] but also wants to know about [employer] and oscar.    Thank you.

## 2024-06-17 ENCOUNTER — Encounter: Payer: Self-pay | Admitting: Internal Medicine

## 2024-07-03 ENCOUNTER — Ambulatory Visit: Payer: Self-pay

## 2024-07-03 ENCOUNTER — Ambulatory Visit (INDEPENDENT_AMBULATORY_CARE_PROVIDER_SITE_OTHER): Payer: Self-pay | Admitting: Nurse Practitioner

## 2024-07-03 VITALS — BP 160/94 | HR 79 | Temp 97.7°F | Ht 73.0 in | Wt 228.0 lb

## 2024-07-03 DIAGNOSIS — I10 Essential (primary) hypertension: Secondary | ICD-10-CM

## 2024-07-03 MED ORDER — VALSARTAN 40 MG PO TABS: 40.0000 mg | ORAL_TABLET | Freq: Every day | ORAL | 3 refills | Status: DC

## 2024-07-03 NOTE — Telephone Encounter (Signed)
 Please advise

## 2024-07-03 NOTE — Telephone Encounter (Signed)
 FYI Only or Action Required?: FYI only for provider: appointment scheduled on 1/16.  Patient was last seen in primary care on 05/04/2024 by Plotnikov, Glen GAILS, MD.  Called Nurse Triage reporting Dizziness.  Symptoms began today.  Interventions attempted: Prescription medications: Valsartan  old rx. From 2023.  Symptoms are: unchanged.  Triage Disposition: See Physician Within 24 Hours  Patient/caregiver understands and will follow disposition?: Yes      Message from Kevelyn M sent at 07/03/2024  1:37 PM EST  Reason for Triage: BP 181/91 Feels light headed and dizziness. Symptoms today only. Patient is not on BP medication.He was previously in 2023.   Reason for Disposition  Taking a medicine that could cause dizziness (e.g., blood pressure medications, diuretics)  Answer Assessment - Initial Assessment Questions 1. DESCRIPTION: Describe your dizziness.     Intermittent dizziness today  2. LIGHTHEADED: Do you feel lightheaded? (e.g., somewhat faint, woozy, weak upon standing)     Yes  3. VERTIGO: Do you feel like either you or the room is spinning or tilting? (i.e., vertigo)     No   4. SEVERITY: How bad is it?  Do you feel like you are going to faint? Can you stand and walk?     Mild   5. ONSET:  When did the dizziness begin?     Today  6. AGGRAVATING FACTORS: Does anything make it worse? (e.g., standing, change in head position)     Sitting helps. Movement makes it worse.     8. CAUSE: What do you think is causing the dizziness? (e.g., decreased fluids or food, diarrhea, emotional distress, heat exposure, new medicine, sudden standing, vomiting; unknown)     Unsure   9. RECURRENT SYMPTOM: Have you had dizziness before? If Yes, ask: When was the last time? What happened that time?     No   10. OTHER SYMPTOMS: Do you have any other symptoms? (e.g., fever, chest pain, vomiting, diarrhea, bleeding)       No other symptoms noted, no SOB or  chest, blurred vision or headache.      Patient called in to triage with complaints of dizziness, and HTN  This has been ongoing for one day.  The patient stated his blood pressures are between 170/91, 160/90, 181/91 today For home care, the patient is taking old prescription of Valsartan  from 2023.   Appointment scheduled for further evaluation; Patient agrees with the plan of care, and will reach out if symptoms worsen or persist.  Protocols used: Dizziness - Lightheadedness-A-AH

## 2024-07-03 NOTE — Assessment & Plan Note (Signed)
 Primary hypertension Recent exacerbation with BP up to 191/100 mmHg. EKG normal. Decongestant use may have contributed. Previous valsartan  effective but caused hypotension with GLP-1. Discussed risks of untreated hypertension. Restart valsartan  at lower dose due to previous tolerance. - Ordered EKG, discussed that high BP can be associated with MI and while EKG is normal, MI is not completely ruled out. If he has chest pain, worsening symptoms, or new symptoms he should proceed to the ER.  - Restart valsartan  at 40 mg daily. - Monitor blood pressure at home once daily, one hour after taking medication. - Scheduled follow-up appointment in 7-10 days for blood pressure check and kidney function re-evaluation. - Advised to avoid decongestants until blood pressure is better controlled. - Sent prescription for 30 tablets of valsartan  to [facility].

## 2024-07-03 NOTE — Progress Notes (Signed)
 "  Established Patient Office Visit  Subjective   Patient ID: Glen [name], male    DOB: March 29, 1966  Age: 59 y.o. MRN: [id number]  Chief Complaint  Patient presents with   Dizziness    Lightheadedness, BP reading high and also fatigue. Pt stated it been an on and off issue for about 2 weeks. But worse today.   Discussed the use of AI scribe software for clinical note transcription with the patient, who gave verbal consent to proceed.  History of Present Illness Glen [name] is a 59 year old male with hypertension who presents with elevated blood pressure and associated symptoms.  Elevated blood pressure - Rising home blood pressures, with a peak of 191/100 mmHg today at 1:30 PM - Off antihypertensive medication for approximately 18 months after experiencing a marked blood pressure drop with valsartan  in combination with a GLP-1 injection - No longer taking GLP-1  - Disequilibrium sensation with shortness of breath during exertion but no chest pain, blurry vision, headache - Has been treating sinus infection with OTC decongestants      Review of Systems  Constitutional:  Positive for malaise/fatigue.  Eyes:  Negative for blurred vision.  Respiratory:  Positive for shortness of breath. Negative for cough.   Cardiovascular:  Negative for chest pain and palpitations.  Neurological:  Positive for dizziness. Negative for headaches.      Objective:     BP (!) 160/94   Pulse 79   Temp 97.7 F (36.5 C) (Temporal)   Ht 6' 1 (1.854 m)   Wt 228 lb (103.4 kg)   SpO2 97%   BMI 30.08 kg/m  BP Readings from Last 3 Encounters:  07/03/24 (!) 160/94  05/04/24 (!) 142/80  04/20/24 (!) 142/80   Wt Readings from Last 3 Encounters:  07/03/24 228 lb (103.4 kg)  05/04/24 226 lb 3.2 oz (102.6 kg)  04/20/24 225 lb (102.1 kg)      Physical Exam Vitals reviewed.  Constitutional:      Appearance: Normal appearance.  HENT:     Head: Normocephalic and atraumatic.   Cardiovascular:     Rate and Rhythm: Normal rate and regular rhythm.  Pulmonary:     Effort: Pulmonary effort is normal.     Breath sounds: Normal breath sounds.  Musculoskeletal:     Cervical back: Neck supple.  Skin:    General: Skin is warm and dry.  Neurological:     Mental Status: He is alert and oriented to person, place, and time.  Psychiatric:        Mood and Affect: Mood normal.        Behavior: Behavior normal.        Thought Content: Thought content normal.        Judgment: Judgment normal.    EKG: NSR  No results found for any visits on 07/03/24.    The 10-year ASCVD risk score (Arnett DK, et al., 2019) is: 14.4%    Assessment & Plan:   Problem List Items Addressed This Visit       Cardiovascular and Mediastinum   HTN (hypertension) - Primary   Relevant Medications   valsartan  (DIOVAN ) 40 MG tablet   Other Relevant Orders   EKG 12-Lead   Assessment and Plan Assessment & Plan Primary hypertension Recent exacerbation with BP up to 191/100 mmHg. EKG normal. Decongestant use may have contributed. Previous valsartan  effective but caused hypotension with GLP-1. Discussed risks of untreated hypertension. Restart valsartan  at lower dose  due to previous tolerance. - Ordered EKG, discussed that high BP can be associated with MI and while EKG is normal, MI is not completely ruled out. If he has chest pain, worsening symptoms, or new symptoms he should proceed to the ER.  - Restart valsartan  at 40 mg daily. - Monitor blood pressure at home once daily, one hour after taking medication. - Scheduled follow-up appointment in 7-10 days for blood pressure check and kidney function re-evaluation. - Advised to avoid decongestants until blood pressure is better controlled. - Sent prescription for 30 tablets of valsartan  to [facility].    Return in about 1 week (around 07/10/2024) for F/U with Lauraine or PCP.    Lauraine FORBES Pereyra, NP  "

## 2024-07-06 ENCOUNTER — Telehealth: Payer: Self-pay

## 2024-07-06 NOTE — Telephone Encounter (Signed)
 Copied from CRM [id number]. Topic: Appointments - Scheduling Inquiry for Clinic >> Jul 06, 2024  1:50 PM Corin V wrote: Reason for CRM: Patient was instructed to do a 1 week follow up with Lauraine Pereyra or Dr. Garald for blood pressure (on/around 07/10/24). There is no availability until mid February. He wants to know if they feel he is safe to wait until his 07/20/24 appointment with Dr. Garald or if he needs to be worked in sooner. He is off Mondays and Tuesdays if he needs to be worked in.

## 2024-07-06 NOTE — Telephone Encounter (Signed)
 Called patient and LVM letting him know that for his condition we would need to get him scheduled asap here in office and to contact us  to make an appointment when he can

## 2024-07-07 ENCOUNTER — Encounter (HOSPITAL_BASED_OUTPATIENT_CLINIC_OR_DEPARTMENT_OTHER): Payer: Self-pay | Admitting: Emergency Medicine

## 2024-07-07 ENCOUNTER — Emergency Department (HOSPITAL_BASED_OUTPATIENT_CLINIC_OR_DEPARTMENT_OTHER)
Admission: EM | Admit: 2024-07-07 | Discharge: 2024-07-07 | Disposition: A | Discharge status: Home / Self Care | Payer: Self-pay | Attending: Emergency Medicine | Admitting: Emergency Medicine

## 2024-07-07 ENCOUNTER — Encounter: Payer: Self-pay | Admitting: Internal Medicine

## 2024-07-07 ENCOUNTER — Ambulatory Visit: Payer: Self-pay

## 2024-07-07 DIAGNOSIS — Z79899 Other long term (current) drug therapy: Secondary | ICD-10-CM | POA: Insufficient documentation

## 2024-07-07 DIAGNOSIS — I10 Essential (primary) hypertension: Secondary | ICD-10-CM | POA: Insufficient documentation

## 2024-07-07 DIAGNOSIS — R509 Fever, unspecified: Secondary | ICD-10-CM | POA: Insufficient documentation

## 2024-07-07 DIAGNOSIS — R030 Elevated blood-pressure reading, without diagnosis of hypertension: Secondary | ICD-10-CM

## 2024-07-07 LAB — TROPONIN T, HIGH SENSITIVITY: Troponin T High Sensitivity: 8 ng/L (ref 0–19)

## 2024-07-07 LAB — CBC WITH DIFFERENTIAL/PLATELET
Abs Immature Granulocytes: 0.02 K/uL (ref 0.00–0.07)
Basophils Absolute: 0 K/uL (ref 0.0–0.1)
Basophils Relative: 0 %
Eosinophils Absolute: 0 K/uL (ref 0.0–0.5)
Eosinophils Relative: 0 %
HCT: 43.5 % (ref 39.0–52.0)
Hemoglobin: 15.5 g/dL (ref 13.0–17.0)
Immature Granulocytes: 0 %
Lymphocytes Relative: 5 %
Lymphs Abs: 0.5 K/uL — ABNORMAL LOW (ref 0.7–4.0)
MCH: 30.9 pg (ref 26.0–34.0)
MCHC: 35.6 g/dL (ref 30.0–36.0)
MCV: 86.8 fL (ref 80.0–100.0)
Monocytes Absolute: 0.7 K/uL (ref 0.1–1.0)
Monocytes Relative: 7 %
Neutro Abs: 7.8 K/uL — ABNORMAL HIGH (ref 1.7–7.7)
Neutrophils Relative %: 88 %
Platelets: 185 K/uL (ref 150–400)
RBC: 5.01 MIL/uL (ref 4.22–5.81)
RDW: 12.1 % (ref 11.5–15.5)
WBC: 9.1 K/uL (ref 4.0–10.5)
nRBC: 0 % (ref 0.0–0.2)

## 2024-07-07 LAB — BASIC METABOLIC PANEL WITH GFR
Anion gap: 14 (ref 5–15)
BUN: 18 mg/dL (ref 6–20)
CO2: 23 mmol/L (ref 22–32)
Calcium: 9.6 mg/dL (ref 8.9–10.3)
Chloride: 102 mmol/L (ref 98–111)
Creatinine, Ser: 0.88 mg/dL (ref 0.61–1.24)
GFR, Estimated: >60 mL/min
Glucose, Bld: 102 mg/dL — ABNORMAL HIGH (ref 70–99)
Potassium: 3.8 mmol/L (ref 3.5–5.1)
Sodium: 138 mmol/L (ref 135–145)

## 2024-07-07 LAB — RESP PANEL BY RT-PCR (RSV, FLU A&B, COVID)  RVPGX2
Influenza A by PCR: NEGATIVE
Influenza B by PCR: NEGATIVE
Resp Syncytial Virus by PCR: NEGATIVE
SARS Coronavirus 2 by RT PCR: NEGATIVE

## 2024-07-07 MED ORDER — ACETAMINOPHEN 325 MG PO TABS
650.0000 mg | ORAL_TABLET | Freq: Once | ORAL | Status: AC | PRN
  Administered 2024-07-07: 650 mg via ORAL
  Filled 2024-07-07: qty 2

## 2024-07-07 NOTE — ED Triage Notes (Signed)
 Pt caox4 c/o HTN intermittently since Friday. Pt further reports he saw PCP Friday and was put on HTN meds. Pt febrile in triage. Pt then states he had a tooth pulled this morning.

## 2024-07-07 NOTE — ED Provider Notes (Signed)
 " [place] EMERGENCY DEPARTMENT AT [facility] Provider Note   CSN: [id number] Arrival date & time: 07/07/24  1516     Patient presents with: Hypertension   Glen [name] is a 59 y.o. male.   Patient is a 59 year old male with past medical history of hypertension and panic disorder presenting to the emergency department with anxiety and neck pain.  Patient reports that he was feeling well this morning and went to the dentist and got a tooth pulled.  He states that he took his blood pressure around lunchtime and it was in the 120s and then suddenly around 1 PM he started to feel panicky.  He states that his neck felt very tight and he felt dizzy and was shaking in his arms and legs.  He states that he tried to take a Xanax  without any improvement of his symptoms so he rechecked his blood pressure and saw that it was in the 190s systolic.  He states that he called his primary doctor who recommended that he be seen in the ER.  Of note he says that he was recently restarted on his losartan about a week ago after his blood pressures were running high.  He states that he has not felt feverish but did feel hot and sweaty when his symptoms started with some associated nausea.  Denies any vomiting or diarrhea.  He states he has had a mild dry cough, denies any congestion or sore throat.  He denies any headache.  The history is provided by the patient.  Hypertension       Prior to Admission medications  Medication Sig Start Date End Date Taking? Authorizing Provider  ALPRAZolam  (XANAX ) 0.25 MG tablet Take 1 tablet (0.25 mg total) by mouth 2 (two) times daily as needed for anxiety (Panic attacks). 11/07/21   Plotnikov, Aleksei V, MD  aspirin-acetaminophen -caffeine (EXCEDRIN MIGRAINE) 250-250-65 MG tablet Take 1 tablet by mouth as needed for headache.    [provider]  Azelastine -Fluticasone  137-50 MCG/ACT SUSP Place 1 spray into the nose every 12 (twelve) hours. 04/20/24    Plotnikov, Aleksei V, MD  cefdinir  (OMNICEF ) 300 MG capsule Take 1 capsule (300 mg total) by mouth 2 (two) times daily. 08/25/23   Plotnikov, Karlynn GAILS, MD  citalopram  (CELEXA ) 20 MG tablet Take 1/2 (one-half) tablet by mouth once daily 04/22/24   Plotnikov, Aleksei V, MD  diclofenac  Sodium (VOLTAREN ) 1 % GEL Apply 2 g topically as needed. 08/29/21   [provider]  fluticasone  (FLONASE ) 50 MCG/ACT nasal spray Place 2 sprays into both nostrils 2 (two) times daily. 12/12/23   Soldatova, Liuba, MD  icosapent  Ethyl (VASCEPA ) 1 g capsule Take 2 capsules (2 g total) by mouth 2 (two) times daily. 05/04/24   Plotnikov, Aleksei V, MD  levocetirizine (XYZAL  ALLERGY 24HR) 5 MG tablet Take 1 tablet (5 mg total) by mouth every evening. 04/20/24   Plotnikov, Aleksei V, MD  LUMIFY 0.025 % SOLN SMARTSIG:1 Drop(s) In Eye(s) PRN 05/23/21   [provider]  mesalamine  (CANASA ) 1000 MG suppository Place 1 suppository (1,000 mg total) rectally at bedtime. Patient taking differently: Place 1,000 mg rectally as needed. 07/12/22   Avram Lupita BRAVO, MD  methylPREDNISolone  (MEDROL  DOSEPAK) 4 MG TBPK tablet As directed 09/09/23   Plotnikov, Aleksei V, MD  omeprazole  (PRILOSEC) 40 MG capsule Take 1 capsule (40 mg total) by mouth daily. 04/22/24   Plotnikov, Karlynn GAILS, MD  ondansetron  (ZOFRAN -ODT) 4 MG disintegrating tablet Take 4 mg by mouth  every 8 (eight) hours as needed. 02/07/23   [provider]  SUMAtriptan  (IMITREX ) 20 MG/ACT nasal spray Place 1 spray (20 mg total) into the nose every 2 (two) hours as needed for migraine or headache. May repeat in 2 hours if headache persists or recurs. 06/05/22   Plotnikov, Karlynn GAILS, MD  valACYclovir  (VALTREX ) 500 MG tablet TAKE 1 TABLET BY MOUTH TWICE DAILY AS NEEDED FOR  FEVER  BLISTERS.  NEEDS  APPOINTMENT  FOR  MORE  REFILLS 04/27/24   Plotnikov, Karlynn GAILS, MD  valsartan  (DIOVAN ) 40 MG tablet Take 1 tablet (40 mg total) by mouth daily. 07/03/24   Elnor Lauraine BRAVO, NP   zolmitriptan  (ZOMIG ) 5 MG tablet Take 1 tablet (5 mg total) by mouth as needed for migraine. 06/05/22   Plotnikov, Aleksei V, MD    Allergies: Codeine, Hydrocodone, Mounjaro  [tirzepatide ], Penicillins, and Sudafed [pseudoephedrine hcl]    Review of Systems  Updated Vital Signs BP (!) 145/90   Pulse 87   Temp 99.6 F (37.6 C)   Resp 18   Ht 6' 1 (1.854 m)   Wt 103 kg   SpO2 98%   BMI 29.95 kg/m   Physical Exam Vitals and nursing note reviewed.  Constitutional:      General: He is not in acute distress.    Appearance: Normal appearance.  HENT:     Head: Normocephalic and atraumatic.     Nose: Nose normal.     Mouth/Throat:     Mouth: Mucous membranes are moist.     Pharynx: Oropharynx is clear. No oropharyngeal exudate or posterior oropharyngeal erythema.  Eyes:     Extraocular Movements: Extraocular movements intact.     Conjunctiva/sclera: Conjunctivae normal.  Neck:     Comments: Negative Kernig and Brudzinski sign Cardiovascular:     Rate and Rhythm: Regular rhythm. Tachycardia present.     Pulses: Normal pulses.     Heart sounds: Normal heart sounds.  Pulmonary:     Effort: Pulmonary effort is normal.     Breath sounds: Normal breath sounds.  Abdominal:     General: Abdomen is flat.     Palpations: Abdomen is soft.     Tenderness: There is no abdominal tenderness.  Musculoskeletal:        General: Normal range of motion.     Cervical back: Normal range of motion and neck supple. No rigidity.     Right lower leg: No edema.     Left lower leg: No edema.  Skin:    General: Skin is warm and dry.  Neurological:     General: No focal deficit present.     Mental Status: He is alert and oriented to person, place, and time.  Psychiatric:        Behavior: Behavior normal.     Comments: Anxious mood     (all labs ordered are listed, but only abnormal results are displayed) Labs Reviewed  BASIC METABOLIC PANEL WITH GFR - Abnormal; Notable for the following  components:      Result Value   Glucose, Bld 102 (*)    All other components within normal limits  CBC WITH DIFFERENTIAL/PLATELET - Abnormal; Notable for the following components:   Neutro Abs 7.8 (*)    Lymphs Abs 0.5 (*)    All other components within normal limits  RESP PANEL BY RT-PCR (RSV, FLU A&B, COVID)  [id number]  TROPONIN T, HIGH SENSITIVITY    EKG: EKG Interpretation Date/Time:  Tuesday July 07 2024 16:21:35 EST Ventricular Rate:  91 PR Interval:  142 QRS Duration:  98 QT Interval:  352 QTC Calculation: 433 R Axis:   33  Text Interpretation: Sinus rhythm No significant change since last tracing Confirmed by Ellouise Fine ([id number]) on 07/07/2024 4:29:24 PM  Radiology: No results found.   Procedures   Medications Ordered in the ED  acetaminophen  (TYLENOL ) tablet 650 mg (650 mg Oral Given 07/07/24 1614)    Clinical Course as of 07/07/24 1817  Tue Jul 07, 2024  1752 Initial troponin negative, symptoms started at 1PM today so single troponin is sufficient. Remainder of labs within normal range, viral swab negative.  [VK]  1814 Patient reports symptoms have resolved with Tylenol  and fever coming down. Suspect symptoms related to fever this afternoon. He is stable for discharge home with outpatient follow up.  [VK]    Clinical Course User Index [VK] Kingsley, Jerron Niblack K, DO                                 Medical Decision Making This patient presents to the ED with chief complaint(s) of neck pain, anxiety with pertinent past medical history of hypertension, panic attacks which further complicates the presenting complaint. The complaint involves an extensive differential diagnosis and also carries with it a high risk of complications and morbidity.    The differential diagnosis includes ACS, arrhythmia, anemia, electrolyte abnormality, anxiety/panic attack, viral syndrome, no headache or neurologic deficits making a dissection unlikely, no headache or meningeal signs  making meningitis unlikely  Additional history obtained: Additional history obtained from spouse Records reviewed Primary Care Documents  ED Course and Reassessment: On patient's arrival he is febrile and mildly tachycardic, anxious appearing but in no acute distress.  He had given Tylenol  for his fever in triage and had viral swab performed.  Patient will have EKG and labs including troponin and will be closely reassessed.  Independent labs interpretation:  The following labs were independently interpreted: within normal range  Independent visualization of imaging: - N/A  Consultation: - Consulted or discussed management/test interpretation w/ external professional: N/A  Consideration for admission or further workup: Patient has no emergent conditions requiring admission or further work-up at this time and is stable for discharge home with primary care follow-up  Social Determinants of health: N/A    Amount and/or Complexity of Data Reviewed Labs: ordered.  Risk OTC drugs.        Final diagnoses:  Fever, unspecified fever cause  Transient hypertension    ED Discharge Orders     None          Ellouise Fine POUR, DO 07/07/24 1817  "

## 2024-07-07 NOTE — Telephone Encounter (Signed)
 ED advised for pt noting BP 169/99, extreme anxiety since 1300, and stiff neck.   Reason for Triage: spike in heart rate and anxiety; high blood pressure. Pain and stiffness is spreading to his neck.    FYI Only or Action Required?: FYI only for provider: ED advised.  Patient was last seen in primary care on 07/03/2024 by Elnor Lauraine BRAVO, NP.  Called Nurse Triage reporting Hypertension and Anxiety.  Symptoms began today.  Interventions attempted: Prescription medications: valsartan  as prescribed this AM.  Symptoms are: rapidly worsening.  Triage Disposition: Go to ED Now (Notify PCP)  Patient/caregiver understands and will follow disposition?: Yes  Reason for Disposition  [1] Systolic BP >= 160 OR Diastolic >= 100 AND [2] cardiac (e.g., breathing difficulty, chest pain) or neurologic symptoms (e.g., new-onset blurred or double vision, unsteady gait)    Anxiety vs impending doom  Answer Assessment - Initial Assessment Questions 1. BLOOD PRESSURE: What is your blood pressure?      169/99  2. ONSET: When did you take your blood pressure?     Just before I called you guys  4. HISTORY: Do you have a history of high blood pressure?     Yes, takes 40mg  Valsartan  daily   6. OTHER SYMPTOMS: Do you have any symptoms? (e.g., blurred vision, chest pain, difficulty breathing, headache, weakness)     Stiff neck and extreme anxiety  Protocols used: Blood Pressure - High-A-AH

## 2024-07-07 NOTE — Discharge Instructions (Signed)
 You were seen in the emergency department for your high blood pressure.  You did have a fever here and your blood pressure and symptoms improved as your fever came down.  You tested negative for COVID, flu and RSV but you likely have another early viral infection.  Your heart enzyme was normal here and had no signs of heart attack or injury to your kidneys from your blood pressure being high.  You can continue to take Tylenol  and Motrin  as needed for fever and can follow-up with your primary doctor as scheduled for blood pressure recheck.  You can return to the emergency department for any new or concerning symptoms.

## 2024-07-08 ENCOUNTER — Encounter: Payer: Self-pay | Admitting: Internal Medicine

## 2024-07-08 ENCOUNTER — Ambulatory Visit (INDEPENDENT_AMBULATORY_CARE_PROVIDER_SITE_OTHER): Payer: Self-pay | Admitting: Internal Medicine

## 2024-07-08 VITALS — BP 120/80 | HR 96 | Temp 99.6°F | Ht 73.0 in | Wt 224.0 lb

## 2024-07-08 DIAGNOSIS — F419 Anxiety disorder, unspecified: Secondary | ICD-10-CM

## 2024-07-08 DIAGNOSIS — I10 Essential (primary) hypertension: Secondary | ICD-10-CM

## 2024-07-08 MED ORDER — VALSARTAN 40 MG PO TABS: 40.0000 mg | ORAL_TABLET | Freq: Two times a day (BID) | ORAL | 3 refills | Status: DC

## 2024-07-08 MED ORDER — CLONIDINE HCL 0.1 MG PO TABS: 0.1000 mg | ORAL_TABLET | Freq: Three times a day (TID) | ORAL | 3 refills | Status: DC | PRN

## 2024-07-08 MED ORDER — ALPRAZOLAM 0.25 MG PO TABS: 0.2500 mg | ORAL_TABLET | Freq: Three times a day (TID) | ORAL | 1 refills | Status: AC | PRN

## 2024-07-08 NOTE — Progress Notes (Signed)
 "  Subjective:  Patient ID: Glen [name], male    DOB: [birth year]  Age: 59 y.o. MRN: [id number]  CC: Hospitalization Follow-up (ER visit and my blood pressure/)   HPI MAKHAI FULCO presents for a couple episodes of alleged panic attacks during the past week F/u on HTN with SBP in the 100 790 range She was seen by Ms. Elnor on 07/03/2024 and started on valsartan . He took 1 Xanax  with no effect on his condition He went to the emergency room on 07/07/2024 with a panicky feeling, dizziness, shaking.  He took 1 Xanax  with no effect on his condition Lemond is here today for a follow-up He denies being stressed out.  He is worried about his high blood pressure.  He does not drink.  He does not use any drugs. There is no chest pain, shortness of breath, palpitations outside of these episodes  Outpatient Medications Prior to Visit  Medication Sig Dispense Refill   Azelastine -Fluticasone  137-50 MCG/ACT SUSP Place 1 spray into the nose every 12 (twelve) hours. 69 g 3   citalopram  (CELEXA ) 20 MG tablet Take 1/2 (one-half) tablet by mouth once daily 90 tablet 3   diclofenac  Sodium (VOLTAREN ) 1 % GEL Apply 2 g topically daily as needed (for pain).     fluticasone  (FLONASE ) 50 MCG/ACT nasal spray Place 2 sprays into both nostrils 2 (two) times daily. 16 g 6   icosapent  Ethyl (VASCEPA ) 1 g capsule Take 2 capsules (2 g total) by mouth 2 (two) times daily. 120 capsule 11   levocetirizine (XYZAL  ALLERGY 24HR) 5 MG tablet Take 1 tablet (5 mg total) by mouth every evening. 90 tablet 3   LUMIFY 0.025 % SOLN SMARTSIG:1 Drop(s) In Eye(s) PRN     omeprazole  (PRILOSEC) 40 MG capsule Take 1 capsule (40 mg total) by mouth daily. 90 capsule 3   ondansetron  (ZOFRAN -ODT) 4 MG disintegrating tablet Take 4 mg by mouth every 8 (eight) hours as needed for nausea or vomiting.     SUMAtriptan  (IMITREX ) 20 MG/ACT nasal spray Place 1 spray (20 mg total) into the nose every 2 (two) hours as needed for migraine or headache. May  repeat in 2 hours if headache persists or recurs. 1 each 5   valACYclovir  (VALTREX ) 500 MG tablet TAKE 1 TABLET BY MOUTH TWICE DAILY AS NEEDED FOR  FEVER  BLISTERS.  NEEDS  APPOINTMENT  FOR  MORE  REFILLS 30 tablet 0   zolmitriptan  (ZOMIG ) 5 MG tablet Take 1 tablet (5 mg total) by mouth as needed for migraine. 10 tablet 3   ALPRAZolam  (XANAX ) 0.25 MG tablet Take 1 tablet (0.25 mg total) by mouth 2 (two) times daily as needed for anxiety (Panic attacks). 60 tablet 1   aspirin-acetaminophen -caffeine (EXCEDRIN MIGRAINE) 250-250-65 MG tablet Take 1 tablet by mouth as needed for headache.     cefdinir  (OMNICEF ) 300 MG capsule Take 1 capsule (300 mg total) by mouth 2 (two) times daily. (Patient not taking: Reported on 07/10/2024) 20 capsule 0   mesalamine  (CANASA ) 1000 MG suppository Place 1 suppository (1,000 mg total) rectally at bedtime. (Patient not taking: Reported on 07/10/2024) 30 suppository 0   methylPREDNISolone  (MEDROL  DOSEPAK) 4 MG TBPK tablet As directed (Patient not taking: Reported on 07/10/2024) 21 tablet 0   valsartan  (DIOVAN ) 40 MG tablet Take 1 tablet (40 mg total) by mouth daily. 30 tablet 3   No facility-administered medications prior to visit.    ROS: Review of Systems  Constitutional:  Negative for appetite  change, fatigue and unexpected weight change.  HENT:  Negative for congestion, nosebleeds, sneezing, sore throat and trouble swallowing.   Eyes:  Negative for itching and visual disturbance.  Respiratory:  Negative for cough and shortness of breath.   Cardiovascular:  Negative for chest pain, palpitations and leg swelling.  Gastrointestinal:  Negative for abdominal distention, blood in stool, diarrhea and nausea.  Genitourinary:  Negative for frequency and hematuria.  Musculoskeletal:  Negative for back pain, gait problem, joint swelling and neck pain.  Skin:  Negative for rash.  Neurological:  Positive for dizziness. Negative for tremors, seizures, speech difficulty,  weakness and light-headedness.  Hematological:  Does not bruise/bleed easily.  Psychiatric/Behavioral:  Negative for agitation, dysphoric mood, sleep disturbance and suicidal ideas. The patient is nervous/anxious.     Objective:  BP 120/80   Pulse 96   Temp 99.6 F (37.6 C) (Oral)   Ht 6' 1 (1.854 m)   Wt 224 lb (101.6 kg)   SpO2 98%   BMI 29.55 kg/m   BP Readings from Last 3 Encounters:  07/10/24 134/83  07/08/24 120/80  07/07/24 (!) 145/90    Wt Readings from Last 3 Encounters:  07/10/24 223 lb 15.8 oz (101.6 kg)  07/08/24 224 lb (101.6 kg)  07/07/24 227 lb (103 kg)    Physical Exam Constitutional:      General: He is not in acute distress.    Appearance: Normal appearance. He is well-developed.     Comments: NAD  Eyes:     Conjunctiva/sclera: Conjunctivae normal.     Pupils: Pupils are equal, round, and reactive to light.  Neck:     Thyroid : No thyromegaly.     Vascular: No JVD.  Cardiovascular:     Rate and Rhythm: Normal rate and regular rhythm.     Heart sounds: Normal heart sounds. No murmur heard.    No friction rub. No gallop.  Pulmonary:     Effort: Pulmonary effort is normal. No respiratory distress.     Breath sounds: Normal breath sounds. No wheezing or rales.  Chest:     Chest wall: No tenderness.  Abdominal:     General: Bowel sounds are normal. There is no distension.     Palpations: Abdomen is soft. There is no mass.     Tenderness: There is no abdominal tenderness. There is no guarding or rebound.  Musculoskeletal:        General: No tenderness. Normal range of motion.     Cervical back: Normal range of motion.  Lymphadenopathy:     Cervical: No cervical adenopathy.  Skin:    General: Skin is warm and dry.     Findings: No rash.  Neurological:     Mental Status: He is alert and oriented to person, place, and time.     Cranial Nerves: No cranial nerve deficit.     Motor: No abnormal muscle tone.     Coordination: Coordination normal.      Gait: Gait normal.     Deep Tendon Reflexes: Reflexes are normal and symmetric.  Psychiatric:        Behavior: Behavior normal.        Thought Content: Thought content normal.        Judgment: Judgment normal.     Lab Results  Component Value Date   WBC 5.2 07/10/2024   HGB 15.5 07/10/2024   HCT 44.1 07/10/2024   PLT 180 07/10/2024   GLUCOSE 106 (H) 07/10/2024   CHOL 245 (H)  04/20/2024   TRIG 91.0 04/20/2024   HDL 59.20 04/20/2024   LDLDIRECT 150.6 06/29/2013   LDLCALC 168 (H) 04/20/2024   ALT 19 04/20/2024   AST 21 04/20/2024   NA 138 07/10/2024   K 3.8 07/10/2024   CL 102 07/10/2024   CREATININE 0.86 07/10/2024   BUN 22 (H) 07/10/2024   CO2 23 07/10/2024   TSH 1.830 07/10/2024   PSA 1.45 04/20/2024   HGBA1C 5.6 04/20/2024    No results found.  Assessment & Plan:   Problem List Items Addressed This Visit     Anxiety - Primary   Panic attacks versus other.  Use Xanax  1-2 tablets twice daily as needed      Relevant Medications   ALPRAZolam  (XANAX ) 0.25 MG tablet   HTN (hypertension)   Episodes of elevated blood pressure accompanied by what sounds like a panic attack.  Reviewed emergency room, recent office visit notes, labs, EKG Will increase  valsartan  to 40 mg twice a day. Prescribed clonidine  to take 0.1 mg twice daily if blood pressure is greater than 170 Okay to use Xanax  1-2 tablets as needed for severe anxiety Obtain urine for 24-hour metanephrines if repeated attacks       Relevant Medications   valsartan  (DIOVAN ) 40 MG tablet      Meds ordered this encounter  Medications   DISCONTD: cloNIDine  (CATAPRES ) 0.1 MG tablet    Sig: Take 1 tablet (0.1 mg total) by mouth 3 (three) times daily as needed.    Dispense:  90 tablet    Refill:  3   ALPRAZolam  (XANAX ) 0.25 MG tablet    Sig: Take 1-2 tablets (0.25-0.5 mg total) by mouth 3 (three) times daily as needed for anxiety (Panic attacks).    Dispense:  60 tablet    Refill:  1   valsartan   (DIOVAN ) 40 MG tablet    Sig: Take 1 tablet (40 mg total) by mouth 2 (two) times daily.    Dispense:  180 tablet    Refill:  3      Follow-up: Return in about 6 weeks (around 08/19/2024) for a follow-up visit.  Marolyn Noel, MD "

## 2024-07-10 ENCOUNTER — Ambulatory Visit: Payer: Self-pay | Admitting: Internal Medicine

## 2024-07-10 ENCOUNTER — Observation Stay (HOSPITAL_BASED_OUTPATIENT_CLINIC_OR_DEPARTMENT_OTHER)
Admission: EM | Admit: 2024-07-10 | Discharge: 2024-07-10 | Disposition: A | Discharge status: Home / Self Care | Payer: Self-pay | Attending: Internal Medicine | Admitting: Internal Medicine

## 2024-07-10 ENCOUNTER — Encounter (HOSPITAL_BASED_OUTPATIENT_CLINIC_OR_DEPARTMENT_OTHER): Payer: Self-pay

## 2024-07-10 ENCOUNTER — Other Ambulatory Visit (HOSPITAL_COMMUNITY): Payer: Self-pay

## 2024-07-10 ENCOUNTER — Other Ambulatory Visit: Payer: Self-pay

## 2024-07-10 DIAGNOSIS — I10 Essential (primary) hypertension: Secondary | ICD-10-CM

## 2024-07-10 DIAGNOSIS — Z7982 Long term (current) use of aspirin: Secondary | ICD-10-CM | POA: Insufficient documentation

## 2024-07-10 DIAGNOSIS — Z79899 Other long term (current) drug therapy: Secondary | ICD-10-CM | POA: Insufficient documentation

## 2024-07-10 DIAGNOSIS — I480 Paroxysmal atrial fibrillation: Principal | ICD-10-CM | POA: Insufficient documentation

## 2024-07-10 DIAGNOSIS — F1090 Alcohol use, unspecified, uncomplicated: Secondary | ICD-10-CM | POA: Insufficient documentation

## 2024-07-10 DIAGNOSIS — I4891 Unspecified atrial fibrillation: Secondary | ICD-10-CM

## 2024-07-10 DIAGNOSIS — F418 Other specified anxiety disorders: Secondary | ICD-10-CM | POA: Insufficient documentation

## 2024-07-10 DIAGNOSIS — F419 Anxiety disorder, unspecified: Secondary | ICD-10-CM

## 2024-07-10 DIAGNOSIS — K219 Gastro-esophageal reflux disease without esophagitis: Secondary | ICD-10-CM

## 2024-07-10 LAB — CBC
HCT: 44.1 % (ref 39.0–52.0)
Hemoglobin: 15.5 g/dL (ref 13.0–17.0)
MCH: 30.6 pg (ref 26.0–34.0)
MCHC: 35.1 g/dL (ref 30.0–36.0)
MCV: 87 fL (ref 80.0–100.0)
Platelets: 180 K/uL (ref 150–400)
RBC: 5.07 MIL/uL (ref 4.22–5.81)
RDW: 12.4 % (ref 11.5–15.5)
WBC: 5.2 K/uL (ref 4.0–10.5)
nRBC: 0 % (ref 0.0–0.2)

## 2024-07-10 LAB — TSH: TSH: 1.83 u[IU]/mL (ref 0.350–4.500)

## 2024-07-10 LAB — BASIC METABOLIC PANEL WITH GFR
Anion gap: 13 (ref 5–15)
BUN: 22 mg/dL — ABNORMAL HIGH (ref 6–20)
CO2: 23 mmol/L (ref 22–32)
Calcium: 9.7 mg/dL (ref 8.9–10.3)
Chloride: 102 mmol/L (ref 98–111)
Creatinine, Ser: 0.86 mg/dL (ref 0.61–1.24)
GFR, Estimated: >60 mL/min
Glucose, Bld: 106 mg/dL — ABNORMAL HIGH (ref 70–99)
Potassium: 3.8 mmol/L (ref 3.5–5.1)
Sodium: 138 mmol/L (ref 135–145)

## 2024-07-10 LAB — TROPONIN T, HIGH SENSITIVITY
Troponin T High Sensitivity: 8 ng/L (ref 0–19)
Troponin T High Sensitivity: 7 ng/L (ref 0–19)

## 2024-07-10 LAB — HEPARIN LEVEL (UNFRACTIONATED): Heparin Unfractionated: 0.4 [IU]/mL (ref 0.30–0.70)

## 2024-07-10 MED ORDER — HEPARIN BOLUS VIA INFUSION
4000.0000 [IU] | Freq: Once | INTRAVENOUS | Status: AC
  Administered 2024-07-10: 4000 [IU] via INTRAVENOUS

## 2024-07-10 MED ORDER — POLYETHYLENE GLYCOL 3350 17 G PO PACK: 17.0000 g | PACK | Freq: Every day | ORAL | Status: DC | PRN

## 2024-07-10 MED ORDER — ONDANSETRON HCL 4 MG/2ML IJ SOLN: 4.0000 mg | Freq: Four times a day (QID) | INTRAMUSCULAR | Status: DC | PRN

## 2024-07-10 MED ORDER — APIXABAN 5 MG PO TABS
5.0000 mg | ORAL_TABLET | Freq: Two times a day (BID) | ORAL | Status: DC
  Administered 2024-07-10: 5 mg via ORAL
  Filled 2024-07-10: qty 1

## 2024-07-10 MED ORDER — DILTIAZEM HCL-DEXTROSE 125-5 MG/125ML-% IV SOLN (PREMIX)
5.0000 mg/h | INTRAVENOUS | Status: DC
  Administered 2024-07-10: 5 mg/h via INTRAVENOUS
  Filled 2024-07-10: qty 125

## 2024-07-10 MED ORDER — ALBUTEROL SULFATE (2.5 MG/3ML) 0.083% IN NEBU: 2.5000 mg | INHALATION_SOLUTION | RESPIRATORY_TRACT | Status: DC | PRN

## 2024-07-10 MED ORDER — CITALOPRAM HYDROBROMIDE 10 MG PO TABS: 10.0000 mg | ORAL_TABLET | Freq: Every day | ORAL | Status: DC

## 2024-07-10 MED ORDER — METOPROLOL TARTRATE 25 MG PO TABS
25.0000 mg | ORAL_TABLET | Freq: Two times a day (BID) | ORAL | Status: DC
  Administered 2024-07-10: 25 mg via ORAL
  Filled 2024-07-10: qty 1

## 2024-07-10 MED ORDER — DILTIAZEM HCL-DEXTROSE 125-5 MG/125ML-% IV SOLN (PREMIX)
5.0000 mg/h | INTRAVENOUS | Status: DC
  Filled 2024-07-10: qty 125

## 2024-07-10 MED ORDER — PANTOPRAZOLE SODIUM 40 MG PO TBEC: 40.0000 mg | DELAYED_RELEASE_TABLET | Freq: Every day | ORAL | Status: DC

## 2024-07-10 MED ORDER — HEPARIN (PORCINE) 25000 UT/250ML-% IV SOLN
1200.0000 [IU]/h | INTRAVENOUS | Status: DC
  Administered 2024-07-10: 1200 [IU]/h via INTRAVENOUS
  Filled 2024-07-10: qty 250

## 2024-07-10 MED ORDER — ACETAMINOPHEN 325 MG PO TABS: 650.0000 mg | ORAL_TABLET | Freq: Four times a day (QID) | ORAL | Status: DC | PRN

## 2024-07-10 MED ORDER — APIXABAN 5 MG PO TABS: 5.0000 mg | ORAL_TABLET | Freq: Two times a day (BID) | ORAL | 0 refills | Status: AC

## 2024-07-10 MED ORDER — ALPRAZOLAM 0.25 MG PO TABS: 0.2500 mg | ORAL_TABLET | Freq: Three times a day (TID) | ORAL | Status: DC | PRN

## 2024-07-10 MED ORDER — DILTIAZEM LOAD VIA INFUSION
20.0000 mg | Freq: Once | INTRAVENOUS | Status: AC
  Administered 2024-07-10: 20 mg via INTRAVENOUS
  Filled 2024-07-10: qty 20

## 2024-07-10 MED ORDER — ACETAMINOPHEN 650 MG RE SUPP: 650.0000 mg | Freq: Four times a day (QID) | RECTAL | Status: DC | PRN

## 2024-07-10 MED ORDER — METOPROLOL TARTRATE 25 MG PO TABS: 25.0000 mg | ORAL_TABLET | Freq: Two times a day (BID) | ORAL | 0 refills | Status: DC

## 2024-07-10 MED ORDER — ONDANSETRON HCL 4 MG PO TABS: 4.0000 mg | ORAL_TABLET | Freq: Four times a day (QID) | ORAL | Status: DC | PRN

## 2024-07-10 NOTE — Consult Note (Addendum)
 "  Cardiology Consultation   Patient ID: KOLDEN DUPEE MRN: [id number]; DOB: 10-29-1965  Admit date: 07/10/2024 Date of Consult: 07/10/2024  PCP:  Garald Karlynn GAILS, MD   [facility] Providers Cardiologist:  Redell Shallow, MD   Patient Profile: Glen [name] is a 59 y.o. male with a hx of paroxysmal atrial fibrillation, hyperlipidemia, hypertension who is being seen 07/10/2024 for the evaluation of atrial fibrillation at the request of Dr. Raenelle.  History of Present Illness: Mr. Hopping has prior history of palpitations and reportedly with atrial fibrillation since 2024 that was noted on Kardia mobile device although heart monitor 03/2023 without any evidence of atrial fibrillation.  He has been managed on metoprolol .  Not on anticoagulation given prior CHA2DS2-VASc of 0.  Also prior CT cardiac scoring 11/2020 with a CAC score of 0.  Prior episodes felt possibly related to alcohol use.  04/2023 he was last seen without any significant complaints and doing well otherwise.  Echo with normal EF.  Patient now admitted for episode of atrial fibrillation captured on Kardia mobile device yesterday at around 11 PM due to complaints of palpitations.  He was seen in [facility] and transferred to [facility] for further evaluation where he was started on IV heparin  and diltiazem  drip.  By around 1300 today he had converted to sinus rhythm.  TSH normal.  Troponin negative x 2.  Respiratory panel negative.  Patient seen with his husband today.  They report that he has had infrequent episodes of palpitations over the course of 1 year.  Reportedly maybe had an episode of palpitations 3 weeks ago and then again 11 PM yesterday.  Reports he is aware each time he is in atrial fibrillation.  Reports not being on his metoprolol  because he thought since he had stopped the alcohol over a year ago he no longer be on this.  Also reports being recently seen by his PCP where he feels he was extremely  anxious and with elevated blood pressure and was started on antihypertensives in which he has not taken. Otherwise active individual, running x 3 days per week with no issues.    Past Medical History:  Diagnosis Date   Allergic rhinitis    Anxiety    Arthritis    Asthmatic bronchitis    Barrett esophagus    Dry eyes    GERD (gastroesophageal reflux disease)    Headache    prone to migraines   Hepatitis B infection 2008   recovered   Hx of adenomatous polyp of colon 2012   Hypertension    Hypothyroidism 2010   Dr. Gaither Music, been tested since and this is an inacurrate diagnosis   Internal hemorrhoids    MRSA infection    (15 years ago, left neck area)   PONV (postoperative nausea and vomiting)    Ulcerative proctitis (HCC)    Urethritis 2010   Vitamin D  deficiency     Past Surgical History:  Procedure Laterality Date   ANTERIOR CERVICAL DECOMP/DISCECTOMY FUSION N/A 08/31/2020   Procedure: ANTERIOR CERVICAL DECOMPRESSION/DISCECTOMY FUSION, INTERBODY PROSTHESIS, PLATE/SCREWS CERVICAL FIVE-SIX, CERVICAL SIX-SEVEN;  Surgeon: Mavis Purchase, MD;  Location: [facility] OR;  Service: Neurosurgery;  Laterality: N/A;  ANTERIOR CERVICAL DECOMPRESSION/DISCECTOMY FUSION, INTERBODY PROSTHESIS, PLATE/SCREWS CERVICAL FIVE-SIX, CERVICAL SIX-SEVEN   COLONOSCOPY     multiple - adenoma 2012   COSMETIC SURGERY     ESOPHAGOGASTRODUODENOSCOPY     multiple - barrett's   FACIAL COSMETIC SURGERY     HEMORRHOID  BANDING     [place]   LIPOSUCTION     SPINE SURGERY     Neck surgery   TEAR DUCT PROBING  2009   UMBILICAL HERNIA REPAIR N/A 11/09/2021   Procedure: OPEN UMBILICAL HERNIA REPAIR WITH MESH;  Surgeon: Vernetta Berg, MD;  Location: [facility];  Service: General;  Laterality: N/A;     Scheduled Meds:  citalopram   10 mg Oral Daily   metoprolol  tartrate  25 mg Oral BID   pantoprazole   40 mg Oral Daily   Continuous Infusions:  diltiazem  (CARDIZEM ) infusion     heparin   1,200 Units/hr (07/10/24 0810)   PRN Meds: acetaminophen  **OR** acetaminophen , albuterol , ALPRAZolam , ondansetron  **OR** ondansetron  (ZOFRAN ) IV, polyethylene glycol  Allergies:   Allergies[1]  Social History:   Social History   Socioeconomic History   Marital status: Married    Spouse name: Not on file   Number of children: Not on file   Years of education: Not on file   Highest education level: GED or equivalent  Occupational History   Not on file  Tobacco Use   Smoking status: Former    Current packs/day: 0.00    Average packs/day: 1 pack/day for 15.0 years (15.0 ttl pk-yrs)    Types: Cigarettes    Quit date: 06/19/1999    Years since quitting: 25.0   Smokeless tobacco: Never  Vaping Use   Vaping status: Never Used  Substance and Sexual Activity   Alcohol use: Yes    Alcohol/week: 8.0 standard drinks of alcohol    Types: 3 Glasses of wine, 5 Standard drinks or equivalent per week    Comment: a glass of wine each night.   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   Former smoker, 1 alcoholic beverage a day 2 caffeinated beverages a day no drug use or tobacco now   Regular Exercise- yes   Social Drivers of Health   Tobacco Use: Medium Risk (07/10/2024)   Patient History    Smoking Tobacco Use: Former    Smokeless Tobacco Use: Never    Passive Exposure: Not on file  Financial Resource Strain: Low Risk (04/20/2024)   Overall Financial Resource Strain (CARDIA)    Difficulty of Paying Living Expenses: Not hard at all  Food Insecurity: No Food Insecurity (04/20/2024)   Epic    Worried About Programme Researcher, Broadcasting/film/video in the Last Year: Never true    Ran Out of Food in the Last Year: Never true  Transportation Needs: No Transportation Needs (04/20/2024)   Epic    Lack of Transportation (Medical): No     Lack of Transportation (Non-Medical): No  Physical Activity: Sufficiently Active (04/20/2024)   Exercise Vital Sign    Days of Exercise per Week: 3 days    Minutes of Exercise per Session: 90 min  Stress: No Stress Concern Present (04/20/2024)   [employer] of Occupational Health - Occupational Stress Questionnaire    Feeling of Stress: Not at all  Social Connections: Moderately Isolated (04/20/2024)   Social Connection and Isolation Panel    Frequency of Communication with Friends and Family: More than three times a week    Frequency of Social Gatherings with Friends and Family: Never    Attends Religious Services: Never  Active Member of Clubs or Organizations: No    Attends Banker Meetings: Not on file    Marital Status: Married  Intimate Partner Violence: Not on file  Depression (PHQ2-9): Low Risk (04/15/2023)   Depression (PHQ2-9)    PHQ-2 Score: 0  Alcohol Screen: Low Risk (04/20/2024)   Alcohol Screen    Last Alcohol Screening Score (AUDIT): 3  Housing: Low Risk (04/20/2024)   Epic    Unable to Pay for Housing in the Last Year: No    Number of Times Moved in the Last Year: 0    Homeless in the Last Year: No  Utilities: Not on file  Health Literacy: Not on file    Family History:   Family History  Problem Relation Age of Onset   Stroke Mother    Lupus Mother    Arthritis Mother    Anxiety disorder Mother    Asthma Mother    Hypertension Mother    Heart disease Paternal Uncle 1       atrial fib   Arthritis Maternal Grandmother    Heart disease Paternal Grandfather 21       MI   Depression Other    Hypertension Other    Stroke Other    Pancreatic cancer Other    Liver cancer Other    Colon cancer Neg Hx    Esophageal cancer Neg Hx    Stomach cancer Neg Hx    Rectal cancer Neg Hx      ROS:  Please see the history of present illness.  All other ROS reviewed and negative.     Physical Exam/Data: Vitals:   07/10/24 1330 07/10/24 1400  07/10/24 1430 07/10/24 1602  BP: 107/73 126/85 125/89 134/83  Pulse: 63 64 70 63  Resp: 18 15 17 18   Temp:    98.1 F (36.7 C)  TempSrc:    Oral  SpO2: 100% 100% 100% 98%  Weight:      Height:    6' 1 (1.854 m)    Intake/Output Summary (Last 24 hours) at 07/10/2024 1654 Last data filed at 07/10/2024 1428 Gross per 24 hour  Intake 23.38 ml  Output 325 ml  Net -301.62 ml      07/10/2024    7:00 AM 07/08/2024   10:44 AM 07/07/2024    3:37 PM  Last 3 Weights  Weight (lbs) 223 lb 15.8 oz 224 lb 227 lb  Weight (kg) 101.6 kg 101.606 kg 102.967 kg     Body mass index is 29.55 kg/m.  General:  Well nourished, well developed, in no acute distress HEENT: normal Neck: no JVD Vascular: No carotid bruits; Distal pulses 2+ bilaterally Cardiac:  normal S1, S2; RRR; no murmur  Lungs:  clear to auscultation bilaterally, no wheezing, rhonchi or rales  Abd: soft, nontender, no hepatomegaly  Ext: no edema Musculoskeletal:  No deformities, BUE and BLE strength normal and equal Skin: warm and dry  Neuro:  CNs 2-12 intact, no focal abnormalities noted Psych:  Normal affect   EKG:  The EKG was personally reviewed and demonstrates: 1/23 at 0240 EKG with atrial fibrillation RVR heart rate 116. 130 back in sinus rhythm Telemetry:  Telemetry was personally reviewed and demonstrates: Sinus rhythm heart rate 60-70  Relevant CV Studies: 03/21/2023 heart monitor Patient had a min HR of 54 bpm, max HR of 200 bpm, and avg HR of 73 bpm.  Predominant underlying rhythm was Sinus Rhythm.  2 Ventricular Tachycardia runs occurred, fastest lasting  5 beats with a max rate of 200 bpm, longest 6 beats with an avg rate of 108 bpm Less than 1% ventricular and supraventricular ectopy No patient triggered episodes recorded  03/04/2023 echocardiogram     ECHOCARDIOGRAM REPORT       Patient Name:   DEVONTE MIGUES Date of Exam: 03/04/2023 Medical Rec #:  [id number]      Height:       74.0 in Accession #:     [id number]     Weight:       209.6 lb Date of Birth:  [birth year]       BSA:          2.217 m Patient Age:    57 years       BP:           126/68 mmHg Patient Gender: M              HR:           66 bpm. Exam Location:  [address]  Procedure: 2D Echo, 3D Echo, Cardiac Doppler, Color Doppler and Strain Analysis  Indications:    I48.91 Atrial Fibrillation   History:        Patient has prior history of Echocardiogram examinations, most                 recent 04/28/2012. Risk Factors:Hypertension.   Sonographer:    Carl Rodgers-Jones RDCS Referring Phys: [id number] BRIAN S CRENSHAW  IMPRESSIONS    1. Left ventricular ejection fraction, by estimation, is 55 to 60%. Left ventricular ejection fraction by 3D volume is 58 %. The left ventricle has normal function. The left ventricle has no regional wall motion abnormalities. Left ventricular diastolic  parameters are consistent with Grade I diastolic dysfunction (impaired relaxation). The average left ventricular global longitudinal strain is -24.0 %. The global longitudinal strain is normal.  2. Right ventricular systolic function is normal. The right ventricular size is normal. There is normal pulmonary artery systolic pressure. The estimated right ventricular systolic pressure is 28.4 mmHg.  3. The mitral valve is normal in structure. Trivial mitral valve regurgitation. No evidence of mitral stenosis.  4. The aortic valve is tricuspid. Aortic valve regurgitation is mild. No aortic stenosis is present. Aortic regurgitation PHT measures 502 msec.  5. The inferior vena cava is normal in size with greater than 50% respiratory variability, suggesting right atrial pressure of 3 mmHg.    Laboratory Data: High Sensitivity Troponin:  No results for input(s): TROPONINIHS in the last 720 hours.  Recent Labs  Lab 07/07/24 1634 07/10/24 0258 07/10/24 0519  TRNPT 8 8 7       Chemistry Recent Labs  Lab 07/07/24 1634 07/10/24 0258  NA  138 138  K 3.8 3.8  CL 102 102  CO2 23 23  GLUCOSE 102* 106*  BUN 18 22*  CREATININE 0.88 0.86  CALCIUM  9.6 9.7  GFRNONAA >60 >60  ANIONGAP 14 13    No results for input(s): PROT, ALBUMIN, AST, ALT, ALKPHOS, BILITOT in the last 168 hours. Lipids No results for input(s): CHOL, TRIG, HDL, LABVLDL, LDLCALC, CHOLHDL in the last 168 hours.  Hematology Recent Labs  Lab 07/07/24 1634 07/10/24 0258  WBC 9.1 5.2  RBC 5.01 5.07  HGB 15.5 15.5  HCT 43.5 44.1  MCV 86.8 87.0  MCH 30.9 30.6  MCHC 35.6 35.1  RDW 12.1 12.4  PLT 185 180   Thyroid   Recent Labs  Lab 07/10/24 0659  TSH  1.830    BNPNo results for input(s): BNP, PROBNP in the last 168 hours.  DDimer No results for input(s): DDIMER in the last 168 hours.  Radiology/Studies:  No results found.   Assessment and Plan:  PAF -2022 CAC score 0 -02/2023 EF normal.  No valve disease. Reports symptomatic episode of palpitations yesterday at 11 PM.  By 1300 today he was in sinus rhythm.  Reports another episode possibly 3 weeks ago.  Otherwise only to relatively short episodes over the course of 1 year so likely relatively low burden.  Currently maintaining sinus rhythm. CHADVASC 0-1 for hypertension.  Not sure if this is really true this was diagnosed in the setting of panic attacks and not routinely taking blood pressure medications. Will need to be on eliquis  5mg  BID x 4 weeks though with self cardioversion. Stop heparin .  TSH normal Screen for OSA outpatient Continue Lopressor  25 mg twice daily (was not taking previously because of misunderstanding) In the future may consider flecainide or ablation. Echo could be done outpatient if felt stable for DCCV.  Will arrange follow up.  Risk Assessment/Risk Scores:  CHA2DS2-VASc Score = 1  1} This indicates a 0.6% annual risk of stroke. The patient's score is based upon: CHF History: 0 HTN History: 1 Diabetes History: 0 Stroke History:  0 Vascular Disease History: 0 Age Score: 0 Gender Score: 0      For questions or updates, please contact [facility] Please consult [email or web address] for contact info under     Signed, Thom LITTIE Sluder, PA-C  07/10/2024 4:54 PM     ATTENDING ATTESTATION:  After conducting a review of all available clinical information with the care team, interviewing the patient, and performing a physical exam, I agree with the findings and plan described in this note with adjustments as indicated below which were discussed and enacted by staff above.   GEN: No acute distress, AO x 3 HEENT:  MMM, no JVD, no scleral icterus Cardiac: RRR, no murmurs, rubs, or gallops.  Respiratory: Clear to auscultation bilaterally. GI: Soft, nontender, non-distended  MS: No edema; No deformity. Neuro:  Nonfocal  Vasc:  +2 radial pulses  The patient is a 59 year old male with a history of paroxysmal atrial fibrillation with a CV 2 score of 0, hyperlipidemia, and hypertension who presents with symptomatic atrial fibrillation.  He was treated with diltiazem  and heparin  infusions.  He converted to normal sinus rhythm this afternoon.  Given spontaneous conversion would treat with Eliquis  5 mg twice daily for 4 weeks then stop anticoagulation entirely given CV 2 score of 0.  Continue Lopressor  25 mg twice daily.  Reasonable for discharge home today with atrial fibrillation clinic follow-up.   Shona Pardo, MD Pager [phone]      [1]  Allergies Allergen Reactions   Codeine Other (See Comments)    Makes me really sick, I got hallucinations when the dose was really high 08/13/20 Pt also states he is very sensitive to strong pain medications   Crestor  [Rosuvastatin ]     Bad fatigue   Hydrocodone Nausea And Vomiting   Mounjaro  [Tirzepatide ]     Dizziness and nausea   Penicillins Other (See Comments)    I don't remember, but mom said don't take 08/13/20   Sudafed [Pseudoephedrine Hcl]     palpitations    "

## 2024-07-10 NOTE — Discharge Summary (Signed)
 "  Physician Discharge Summary  Glen [name] FMW:[id number] DOB: 06/20/1965 DOA: 07/10/2024  PCP: Glen Karlynn GAILS, MD  Admit date: 07/10/2024 Discharge date: 07/10/2024  Admitted from: Home Discharge disposition: Home  Recommendations at discharge:  For A-fib, you have been started on metoprolol  25 mg twice daily and Eliquis  5 mg twice daily.  Continue to monitor symptoms. Continue valsartan  as before.  Continue to monitor blood pressure at home. Follow-up with cardiology as an outpatient   Brief narrative: Glen [name] is a 59 y.o. male with PMH significant for HTN, paroxysmal A-fib not on anticoagulation, past alcohol use, GERD, Barrett's esophagus, hypothyroidism. Very early this morning, patient presented to ED at [facility] with complaint of palpitation, fatigue.  He woke up from his sleep around 11 PM last night with palpitation which subsequently resolved but he woke up again in few hours with palpitation/fatigue.  He has an EKG monitoring device at home which did not indicate A-fib but showed a heart rate of 140-150s and hence he presented to the ED.  In the ED, he was tachycardic to 150s, confirmed A-fib with RVR with EKG CBC, CMP, troponin unremarkable Started on Cardizem  infusion, heparin  drip Hospitalist service was consulted.  Virtual admission was done earlier this morning.  Vitals from this afternoon reviewed. Heart rate in 60s, blood pressure in 120s, breathing on room air. Converted to normal sinus rhythm this afternoon and was switched from Cardizem  drip to metoprolol   I evaluated the patient after he arrived to [facility] this afternoon. Patient has had issues with intermittent A-fib since 2024.  Was first noted in [employer] mobile device for the heart monitor later did not show any evidence of A-fib.  He was started on metoprolol  as needed but apparently has not required that for a long time.  Intermittent A-fib was suspected to be due to regular alcohol use. Also  recalled an episode of palpitations 3 weeks ago. Reports his last alcoholic drink was months ago.  Also, recently seen by his PCP on 1/16-and found to have significantly elevated blood pressure-191/100 and started on valsartan  and clonidine .  He reports he started valsartan  but read about clonidine  and did not start it.  Hospital course: A-fib RVR H/o paroxysmal A-fib Presented with symptomatic episode of palpitations last night.  Also reported previous episode 3 weeks ago. Started on heparin  drip and Cardizem  drip in the ED Converted to normal sinus rhythm this afternoon and was switched from Cardizem  drip to metoprolol  TSH normal. RVR probably was precipitated by uncontrolled blood pressure To be seen by cardiology here.  Switched from heparin  drip to Eliquis . Cleared for discharge home today   HTN PTA meds-reports recently started on valsartan  Continue same along with beta-blocker continue monitor blood pressure at home   GERD PPI   Anxiety/depression Seems stable Continue Celexa , as needed Xanax  as before   Goals of care   Code Status: Full Code   Diet:  Diet Order             Diet Heart Room service appropriate? Yes; Fluid consistency: Thin  Diet effective now           Diet general                   Nutritional status:  Body mass index is 29.55 kg/m.       Wounds:  -    Discharge Medications:   Allergies as of 07/10/2024       Reactions   Codeine  Other (See Comments)   Makes me really sick, I got hallucinations when the dose was really high 08/13/20 Pt also states he is very sensitive to strong pain medications   Crestor  [rosuvastatin ]    Bad fatigue   Hydrocodone Nausea And Vomiting   Mounjaro  [tirzepatide ]    Dizziness and nausea   Penicillins Other (See Comments)   I don't remember, but mom said don't take 08/13/20   Sudafed [pseudoephedrine Hcl]    palpitations        Medication List     STOP taking these medications     aspirin-acetaminophen -caffeine 250-250-65 MG tablet Commonly known as: EXCEDRIN MIGRAINE   cefdinir  300 MG capsule Commonly known as: OMNICEF    cloNIDine  0.1 MG tablet Commonly known as: CATAPRES    methylPREDNISolone  4 MG Tbpk tablet Commonly known as: MEDROL  DOSEPAK       TAKE these medications    ALPRAZolam  0.25 MG tablet Commonly known as: XANAX  Take 1-2 tablets (0.25-0.5 mg total) by mouth 3 (three) times daily as needed for anxiety (Panic attacks).   apixaban  5 MG Tabs tablet Commonly known as: ELIQUIS  Take 1 tablet (5 mg total) by mouth 2 (two) times daily.   Azelastine -Fluticasone  137-50 MCG/ACT Susp Place 1 spray into the nose every 12 (twelve) hours.   citalopram  20 MG tablet Commonly known as: CELEXA  Take 1/2 (one-half) tablet by mouth once daily   diclofenac  Sodium 1 % Gel Commonly known as: VOLTAREN  Apply 2 g topically daily as needed (for pain).   fluticasone  50 MCG/ACT nasal spray Commonly known as: FLONASE  Place 2 sprays into both nostrils 2 (two) times daily.   icosapent  Ethyl 1 g capsule Commonly known as: Vascepa  Take 2 capsules (2 g total) by mouth 2 (two) times daily.   levocetirizine 5 MG tablet Commonly known as: Xyzal  Allergy 24HR Take 1 tablet (5 mg total) by mouth every evening.   Lumify 0.025 % Soln Generic drug: Brimonidine Tartrate SMARTSIG:1 Drop(s) In Eye(s) PRN   metoprolol  tartrate 25 MG tablet Commonly known as: LOPRESSOR  Take 1 tablet (25 mg total) by mouth 2 (two) times daily.   omeprazole  40 MG capsule Commonly known as: PRILOSEC Take 1 capsule (40 mg total) by mouth daily.   ondansetron  4 MG disintegrating tablet Commonly known as: ZOFRAN -ODT Take 4 mg by mouth every 8 (eight) hours as needed for nausea or vomiting.   SUMAtriptan  20 MG/ACT nasal spray Commonly known as: IMITREX  Place 1 spray (20 mg total) into the nose every 2 (two) hours as needed for migraine or headache. May repeat in 2 hours if headache  persists or recurs.   valACYclovir  500 MG tablet Commonly known as: VALTREX  TAKE 1 TABLET BY MOUTH TWICE DAILY AS NEEDED FOR  FEVER  BLISTERS.  NEEDS  APPOINTMENT  FOR  MORE  REFILLS   valsartan  40 MG tablet Commonly known as: DIOVAN  Take 1 tablet (40 mg total) by mouth 2 (two) times daily. What changed: when to take this   zolmitriptan  5 MG tablet Commonly known as: Zomig  Take 1 tablet (5 mg total) by mouth as needed for migraine.         Follow ups:    Follow-up Information     Plotnikov, Karlynn GAILS, MD Follow up.   Specialty: Internal Medicine Contact information: [address] [place] [address] [phone]         Thukkani, Arun K, MD Follow up.   Specialty: Cardiology Contact information: [address] [place] [address] [phone]  Discharge Instructions:   Discharge Instructions     Call MD for:  difficulty breathing, headache or visual disturbances   Complete by: As directed    Call MD for:  extreme fatigue   Complete by: As directed    Call MD for:  hives   Complete by: As directed    Call MD for:  persistant dizziness or light-headedness   Complete by: As directed    Call MD for:  persistant nausea and vomiting   Complete by: As directed    Call MD for:  severe uncontrolled pain   Complete by: As directed    Call MD for:  temperature >100.4   Complete by: As directed    Diet general   Complete by: As directed    Discharge instructions   Complete by: As directed    Recommendations at discharge:   For A-fib, you have been started on metoprolol  25 mg twice daily and Eliquis  5 mg twice daily.  Continue to monitor symptoms.  Continue valsartan  as before.  Continue to monitor blood pressure at home.  Follow-up with cardiology as an outpatient   You have been started on a blood thinner.  Please watch out for any obvious bleeding, black stool or easy bruisability.  Please contact your primary care  provider for further recommendation.    General discharge instructions: Follow with Primary MD Plotnikov, Karlynn GAILS, MD in 7 days  Please request your PCP  to go over your hospital tests, procedures, radiology results at the follow up. Please get your medicines reviewed and adjusted.  Your PCP may decide to repeat certain labs or tests as needed. Do not drive, operate heavy machinery, perform activities at heights, swimming or participation in water activities or provide baby sitting services if your were admitted for syncope or siezures until you have seen by Primary MD or a Neurologist and advised to do so again.   Controlled Substance Reporting System database was reviewed. Do not drive, operate heavy machinery, perform activities at heights, swim, participate in water activities or provide baby-sitting services while on medications for pain, sleep and mood until your outpatient physician has reevaluated you and advised to do so again.  You are strongly recommended to comply with the dose, frequency and duration of prescribed medications. Activity: As tolerated with Full fall precautions use walker/cane & assistance as needed Avoid using any recreational substances like cigarette, tobacco, alcohol, or non-prescribed drug. If you experience worsening of your admission symptoms, develop shortness of breath, life threatening emergency, suicidal or homicidal thoughts you must seek medical attention immediately by calling 911 or calling your MD immediately  if symptoms less severe. You must read complete instructions/literature along with all the possible adverse reactions/side effects for all the medicines you take and that have been prescribed to you. Take any new medicine only after you have completely understood and accepted all the possible adverse reactions/side effects.  Wear Seat belts while driving. You were cared for by a hospitalist during your hospital stay. If you have any  questions about your discharge medications or the care you received while you were in the hospital after you are discharged, you can call the unit and ask to speak with the hospitalist or the covering physician. Once you are discharged, your primary care physician will handle any further medical issues. Please note that NO REFILLS for any discharge medications will be authorized once you are discharged, as it is imperative that you return to your primary care physician (  or establish a relationship with a primary care physician if you do not have one).   Increase activity slowly   Complete by: As directed        Discharge Exam:   Vitals:   07/10/24 1330 07/10/24 1400 07/10/24 1430 07/10/24 1602  BP: 107/73 126/85 125/89 134/83  Pulse: 63 64 70 63  Resp: 18 15 17 18   Temp:    98.1 F (36.7 C)  TempSrc:    Oral  SpO2: 100% 100% 100% 98%  Weight:      Height:    6' 1 (1.854 m)    Body mass index is 29.55 kg/m.  General exam: Pleasant, middle-age Caucasian male, not in distress Skin: No rashes, lesions or ulcers. HEENT: Atraumatic, normocephalic, no obvious bleeding Lungs: Clear to auscultation bilaterally,  CVS: S1, S2, no murmur,   GI/Abd: Soft, nontender, nondistended, bowel sound present,   CNS: Alert, awake, oriented x 3 Psychiatry: Mood appropriate Extremities: No pedal edema, no calf tenderness,    The results of significant diagnostics from this hospitalization (including imaging, microbiology, ancillary and laboratory) are listed below for reference.    Procedures and Diagnostic Studies:   No results found.   Labs:   Basic Metabolic Panel: Recent Labs  Lab 07/07/24 1634 07/10/24 0258  NA 138 138  K 3.8 3.8  CL 102 102  CO2 23 23  GLUCOSE 102* 106*  BUN 18 22*  CREATININE 0.88 0.86  CALCIUM  9.6 9.7   GFR Estimated Creatinine Clearance: 115.9 mL/min (by C-G formula based on SCr of 0.86 mg/dL). Liver Function Tests: No results for input(s): AST, ALT,  ALKPHOS, BILITOT, PROT, ALBUMIN in the last 168 hours. No results for input(s): LIPASE, AMYLASE in the last 168 hours. No results for input(s): AMMONIA in the last 168 hours. Coagulation profile No results for input(s): INR, PROTIME in the last 168 hours.  CBC: Recent Labs  Lab 07/07/24 1634 07/10/24 0258  WBC 9.1 5.2  NEUTROABS 7.8*  --   HGB 15.5 15.5  HCT 43.5 44.1  MCV 86.8 87.0  PLT 185 180   Cardiac Enzymes: No results for input(s): CKTOTAL, CKMB, CKMBINDEX, TROPONINI in the last 168 hours. BNP: Invalid input(s): POCBNP CBG: No results for input(s): GLUCAP in the last 168 hours. D-Dimer No results for input(s): DDIMER in the last 72 hours. Hgb A1c No results for input(s): HGBA1C in the last 72 hours. Lipid Profile No results for input(s): CHOL, HDL, LDLCALC, TRIG, CHOLHDL, LDLDIRECT in the last 72 hours. Thyroid  function studies Recent Labs    07/10/24 0659  TSH 1.830   Anemia work up No results for input(s): VITAMINB12, FOLATE, FERRITIN, TIBC, IRON, RETICCTPCT in the last 72 hours. Microbiology Recent Results (from the past 240 hours)  Resp panel by RT-PCR (RSV, Flu A&B, Covid) Anterior Nasal Swab     Status: None   Collection Time: 07/07/24  3:33 PM   Specimen: Anterior Nasal Swab  Result Value Ref Range Status   SARS Coronavirus 2 by RT PCR NEGATIVE NEGATIVE Final    Comment: (NOTE) SARS-CoV-2 target nucleic acids are NOT DETECTED.  The SARS-CoV-2 RNA is generally detectable in upper respiratory specimens during the acute phase of infection. The lowest concentration of SARS-CoV-2 viral copies this assay can detect is 138 copies/mL. A negative result does not preclude SARS-Cov-2 infection and should not be used as the sole basis for treatment or other patient management decisions. A negative result may occur with  improper specimen collection/handling, submission of  specimen other than  nasopharyngeal swab, presence of viral mutation(s) within the areas targeted by this assay, and inadequate number of viral copies(<138 copies/mL). A negative result must be combined with clinical observations, patient history, and epidemiological information. The expected result is Negative.  Fact Sheet for Patients:  bloggercourse.com  Fact Sheet for Healthcare Providers:  seriousbroker.it  This test is no t yet approved or cleared by the [place]  FDA and  has been authorized for detection and/or diagnosis of SARS-CoV-2 by FDA under an Emergency Use Authorization (EUA). This EUA will remain  in effect (meaning this test can be used) for the duration of the COVID-19 declaration under Section 564(b)(1) of the Act, 21 U.S.C.section 360bbb-3(b)(1), unless the authorization is terminated  or revoked sooner.       Influenza A by PCR NEGATIVE NEGATIVE Final   Influenza B by PCR NEGATIVE NEGATIVE Final    Comment: (NOTE) The Xpert Xpress SARS-CoV-2/FLU/RSV plus assay is intended as an aid in the diagnosis of influenza from Nasopharyngeal swab specimens and should not be used as a sole basis for treatment. Nasal washings and aspirates are unacceptable for Xpert Xpress SARS-CoV-2/FLU/RSV testing.  Fact Sheet for Patients: bloggercourse.com  Fact Sheet for Healthcare Providers: seriousbroker.it  This test is not yet approved or cleared by the [place]  FDA and has been authorized for detection and/or diagnosis of SARS-CoV-2 by FDA under an Emergency Use Authorization (EUA). This EUA will remain in effect (meaning this test can be used) for the duration of the COVID-19 declaration under Section 564(b)(1) of the Act, 21 U.S.C. section 360bbb-3(b)(1), unless the authorization is terminated or revoked.     Resp Syncytial Virus by PCR NEGATIVE NEGATIVE Final    Comment:  (NOTE) Fact Sheet for Patients: bloggercourse.com  Fact Sheet for Healthcare Providers: seriousbroker.it  This test is not yet approved or cleared by the [place]  FDA and has been authorized for detection and/or diagnosis of SARS-CoV-2 by FDA under an Emergency Use Authorization (EUA). This EUA will remain in effect (meaning this test can be used) for the duration of the COVID-19 declaration under Section 564(b)(1) of the Act, 21 U.S.C. section 360bbb-3(b)(1), unless the authorization is terminated or revoked.  Performed at [facility], [address], [place], [place] [address]     Time coordinating discharge: 45 minutes  Signed: Chapman Rota  [facility] 07/10/2024, 6:00 PM  "

## 2024-07-10 NOTE — Discharge Instructions (Signed)

## 2024-07-10 NOTE — ED Provider Notes (Signed)
 "  Glen [name]  Provider Note  CSN: [id number] Arrival date & time: 07/10/24 0240  History Chief Complaint  Patient presents with   Palpitations    Glen [name] is a 59 y.o. male with remote history of pAF, felt to be due to EtOH use at the time, CHADSVASC was 0 so no anticoagulation and since he stopped drinking he has not had an episode in a couple of years. He was in the ED earlier this week for tachycardia and HTN, also noted to be febrile then and his HR improved with antipyretics. Otherwise workup was negative and he was ultimately discharged. He was seen the following day by PCP where vitals were reassuring. He had been started on losartan recently but does not have a formal diagnosis of HTN yet. He was previously on metoprolol  for the pAF but stopped when he no longer had symptoms. He woke up a short time prior to arrival with racing heart. He took a dose of metoprolol  without improvement. No CP.    Home Medications Prior to Admission medications  Medication Sig Start Date End Date Taking? Authorizing Provider  ALPRAZolam  (XANAX ) 0.25 MG tablet Take 1-2 tablets (0.25-0.5 mg total) by mouth 3 (three) times daily as needed for anxiety (Panic attacks). 07/08/24   Plotnikov, Aleksei V, MD  aspirin-acetaminophen -caffeine (EXCEDRIN MIGRAINE) 250-250-65 MG tablet Take 1 tablet by mouth as needed for headache.    [provider]  Azelastine -Fluticasone  137-50 MCG/ACT SUSP Place 1 spray into the nose every 12 (twelve) hours. 04/20/24   Plotnikov, Karlynn GAILS, MD  cefdinir  (OMNICEF ) 300 MG capsule Take 1 capsule (300 mg total) by mouth 2 (two) times daily. 08/25/23   Plotnikov, Karlynn GAILS, MD  citalopram  (CELEXA ) 20 MG tablet Take 1/2 (one-half) tablet by mouth once daily 04/22/24   Plotnikov, Aleksei V, MD  cloNIDine  (CATAPRES ) 0.1 MG tablet Take 1 tablet (0.1 mg total) by mouth 3 (three) times daily as needed. 07/08/24   Plotnikov, Aleksei V, MD   diclofenac  Sodium (VOLTAREN ) 1 % GEL Apply 2 g topically as needed. 08/29/21   [provider]  fluticasone  (FLONASE ) 50 MCG/ACT nasal spray Place 2 sprays into both nostrils 2 (two) times daily. 12/12/23   Soldatova, Liuba, MD  icosapent  Ethyl (VASCEPA ) 1 g capsule Take 2 capsules (2 g total) by mouth 2 (two) times daily. 05/04/24   Plotnikov, Aleksei V, MD  levocetirizine (XYZAL  ALLERGY 24HR) 5 MG tablet Take 1 tablet (5 mg total) by mouth every evening. 04/20/24   Plotnikov, Aleksei V, MD  LUMIFY 0.025 % SOLN SMARTSIG:1 Drop(s) In Eye(s) PRN 05/23/21   [provider]  mesalamine  (CANASA ) 1000 MG suppository Place 1 suppository (1,000 mg total) rectally at bedtime. Patient taking differently: Place 1,000 mg rectally as needed. 07/12/22   Avram Lupita BRAVO, MD  methylPREDNISolone  (MEDROL  DOSEPAK) 4 MG TBPK tablet As directed 09/09/23   Plotnikov, Aleksei V, MD  omeprazole  (PRILOSEC) 40 MG capsule Take 1 capsule (40 mg total) by mouth daily. 04/22/24   Plotnikov, Karlynn GAILS, MD  ondansetron  (ZOFRAN -ODT) 4 MG disintegrating tablet Take 4 mg by mouth every 8 (eight) hours as needed. 02/07/23   [provider]  SUMAtriptan  (IMITREX ) 20 MG/ACT nasal spray Place 1 spray (20 mg total) into the nose every 2 (two) hours as needed for migraine or headache. May repeat in 2 hours if headache persists or recurs. 06/05/22   Plotnikov, Karlynn GAILS, MD  valACYclovir  (VALTREX ) 500 MG tablet  TAKE 1 TABLET BY MOUTH TWICE DAILY AS NEEDED FOR  FEVER  BLISTERS.  NEEDS  APPOINTMENT  FOR  MORE  REFILLS 04/27/24   Plotnikov, Karlynn GAILS, MD  valsartan  (DIOVAN ) 40 MG tablet Take 1 tablet (40 mg total) by mouth 2 (two) times daily. 07/08/24   Plotnikov, Aleksei V, MD  zolmitriptan  (ZOMIG ) 5 MG tablet Take 1 tablet (5 mg total) by mouth as needed for migraine. 06/05/22   Plotnikov, Aleksei V, MD     Allergies    Codeine, Crestor  [rosuvastatin ], Hydrocodone, Mounjaro  [tirzepatide ], Penicillins, and Sudafed  [pseudoephedrine hcl]   Review of Systems   Review of Systems Please see HPI for pertinent positives and negatives  Physical Exam BP 111/74   Pulse (!) 113   Temp (!) 97.5 F (36.4 C) (Oral)   Resp 17   SpO2 96%   Physical Exam Vitals and nursing note reviewed.  Constitutional:      Appearance: Normal appearance.  HENT:     Head: Normocephalic and atraumatic.     Nose: Nose normal.     Mouth/Throat:     Mouth: Mucous membranes are moist.  Eyes:     Extraocular Movements: Extraocular movements intact.     Conjunctiva/sclera: Conjunctivae normal.  Cardiovascular:     Rate and Rhythm: Tachycardia present. Rhythm irregular.  Pulmonary:     Effort: Pulmonary effort is normal.     Breath sounds: Normal breath sounds.  Abdominal:     General: Abdomen is flat.     Palpations: Abdomen is soft.     Tenderness: There is no abdominal tenderness.  Musculoskeletal:        General: No swelling. Normal range of motion.     Cervical back: Neck supple.     Right lower leg: No edema.     Left lower leg: No edema.  Skin:    General: Skin is warm and dry.  Neurological:     General: No focal deficit present.     Mental Status: He is alert.  Psychiatric:        Mood and Affect: Mood normal.     ED Results / Procedures / Treatments   EKG EKG Interpretation Date/Time:  Friday July 10 2024 02:46:51 EST Ventricular Rate:  161 PR Interval:    QRS Duration:  91 QT Interval:  288 QTC Calculation: 472 R Axis:   43  Text Interpretation: Atrial fibrillation with rapid ventricular response Repolarization abnormality, prob rate related Since last tracing Atrial fibrillation has replaced Sinus rhythm Confirmed by Roselyn Dunnings [id number]) on 07/10/2024 2:51:12 AM  Procedures .Critical Care  Performed by: Roselyn Dunnings NOVAK, MD Authorized by: Roselyn Dunnings NOVAK, MD   Critical care provider statement:    Critical care time (minutes):  40   Critical care time was exclusive of:   Separately billable procedures and treating other patients   Critical care was necessary to treat or prevent imminent or life-threatening deterioration of the following conditions:  Cardiac failure   Critical care was time spent personally by me on the following activities:  Development of treatment plan with patient or surrogate, discussions with consultants, evaluation of patient's response to treatment, examination of patient, ordering and review of laboratory studies, ordering and review of radiographic studies, ordering and performing treatments and interventions, pulse oximetry, re-evaluation of patient's condition and review of old charts   Care discussed with: admitting provider     Medications Ordered in the ED Medications  diltiazem (CARDIZEM) 1 mg/mL load via  infusion 20 mg (20 mg Intravenous Bolus from Bag 07/10/24 0304)    And  diltiazem (CARDIZEM) 125 mg in dextrose  5% 125 mL (1 mg/mL) infusion (5 mg/hr Intravenous Infusion Verify 07/10/24 0353)    Initial Impression and Plan  Patient here with rapid afib, otherwise vitals are reassuring. Will check labs, give Cardizem for rate control. He is not anticoagulated at this time.   ED Course   Clinical Course as of 07/10/24 0537  Fri Jul 10, 2024  0306 CBC is normal.  [CS]  0332 BMP and Trop are neg. HR improved but remains in afib. [CS]  [id number] Patient remains in Afib, rate variable. Will continue cardizem drop and admit for further management. [CS]  [phone] Spoke with Dr. Franky, Hospitalist, who will accept for admission. TSH added per his request.  [CS]    Clinical Course User Index [CS] Roselyn Carlin NOVAK, MD     MDM Rules/Calculators/A&P Medical Decision Making Problems Addressed: Paroxysmal atrial fibrillation with RVR [name]): acute illness or injury  Amount and/or Complexity of Data Reviewed Labs: ordered. Decision-making details documented in ED Course. ECG/medicine tests: ordered and independent interpretation  performed. Decision-making details documented in ED Course.  Risk Prescription drug management. Decision regarding hospitalization.     Final Clinical Impression(s) / ED Diagnoses Final diagnoses:  Paroxysmal atrial fibrillation with RVR [facility])    Rx / DC Orders ED Discharge Orders     None        Roselyn Carlin NOVAK, MD 07/10/24 0502  "

## 2024-07-10 NOTE — ED Triage Notes (Signed)
 Arrives POV. Woke at 0000 with heart racing, fatigue. Took 25mg  metoprolol  30 prior to arrival. Hx of afib. HR 140 in triage

## 2024-07-10 NOTE — Progress Notes (Signed)
 PHARMACY - ANTICOAGULATION CONSULT NOTE  Pharmacy Consult for IV heparin  Indication: atrial fibrillation  Allergies[1]  Patient Measurements: Height: 6' 1 (185.4 cm) Weight: 101.6 kg (223 lb 15.8 oz) IBW/kg (Calculated) : 79.9 HEPARIN DW (KG): 100.4  Vital Signs: Temp: 97.5 F (36.4 C) (01/23 0247) Temp Source: Oral (01/23 0247) BP: 111/75 (01/23 0545) Pulse Rate: 96 (01/23 0545)  Labs: Recent Labs    07/07/24 1634 07/10/24 0258  HGB 15.5 15.5  HCT 43.5 44.1  PLT 185 180  CREATININE 0.88 0.86    Estimated Creatinine Clearance: 115.9 mL/min (by C-G formula based on SCr of 0.86 mg/dL).   Medical History: Past Medical History:  Diagnosis Date   Allergic rhinitis    Anxiety    Arthritis    Asthmatic bronchitis    Barrett esophagus    Dry eyes    GERD (gastroesophageal reflux disease)    Headache    prone to migraines   Hepatitis B infection 2008   recovered   Hx of adenomatous polyp of colon 2012   Hypertension    Hypothyroidism 2010   Dr. Gaither Music, been tested since and this is an inacurrate diagnosis   Internal hemorrhoids    MRSA infection    (15 years ago, left neck area)   PONV (postoperative nausea and vomiting)    Ulcerative proctitis (HCC)    Urethritis 2010   Vitamin D  deficiency    Assessment: Glen [name] is a 59 y.o. year old male admitted on 07/10/2024 with concern for Afib RVR. No anticoagulation prior to admission. History of AF but no anticoagulation started with Chadsvasc 0 and high risk for falls at the time. Pharmacy consulted to dose heparin.  Goal of Therapy:  Heparin level 0.3-0.7 units/ml Monitor platelets by anticoagulation protocol: Yes   Plan:  Heparin 4000 units x 1 as bolus followed by heparin infusion at 1400 units/hr 6h heparin level  Daily heparin level, CBC, and monitoring for bleeding F/u plans for anticoagulation   Thank you for allowing pharmacy to participate in this patient's care.  Glen [name],  PharmD Emergency Medicine Clinical Pharmacist 07/10/2024,7:18 AM       [1]  Allergies Allergen Reactions   Codeine Other (See Comments)    Makes me really sick, I got hallucinations when the dose was really high 08/13/20 Pt also states he is very sensitive to strong pain medications   Crestor  [Rosuvastatin ]     Bad fatigue   Hydrocodone Nausea And Vomiting   Mounjaro  [Tirzepatide ]     Dizziness and nausea   Penicillins Other (See Comments)    I don't remember, but mom said don't take 08/13/20   Sudafed [Pseudoephedrine Hcl]     palpitations

## 2024-07-10 NOTE — Progress Notes (Signed)
 PHARMACY - ANTICOAGULATION CONSULT NOTE  Pharmacy Consult for IV heparin   Indication: atrial fibrillation  Allergies[1]  Patient Measurements: Height: 6' 1 (185.4 cm) Weight: 101.6 kg (223 lb 15.8 oz) IBW/kg (Calculated) : 79.9 HEPARIN  DW (KG): 100.4  Vital Signs: Temp: 98.1 F (36.7 C) (01/23 1602) Temp Source: Oral (01/23 1602) BP: 134/83 (01/23 1602) Pulse Rate: 63 (01/23 1602)  Labs: Recent Labs    07/07/24 1634 07/10/24 0258 07/10/24 1423  HGB 15.5 15.5  --   HCT 43.5 44.1  --   PLT 185 180  --   HEPARINUNFRC  --   --  0.40  CREATININE 0.88 0.86  --     Estimated Creatinine Clearance: 115.9 mL/min (by C-G formula based on SCr of 0.86 mg/dL).   Medical History: Past Medical History:  Diagnosis Date   Allergic rhinitis    Anxiety    Arthritis    Asthmatic bronchitis    Barrett esophagus    Dry eyes    GERD (gastroesophageal reflux disease)    Headache    prone to migraines   Hepatitis B infection 2008   recovered   Hx of adenomatous polyp of colon 2012   Hypertension    Hypothyroidism 2010   Dr. Gaither Music, been tested since and this is an inacurrate diagnosis   Internal hemorrhoids    MRSA infection    (15 years ago, left neck area)   PONV (postoperative nausea and vomiting)    Ulcerative proctitis (HCC)    Urethritis 2010   Vitamin D  deficiency    Assessment: Glen [name] is a 59 y.o. year old male admitted on 07/10/2024 with concern for Afib RVR. No anticoagulation prior to admission. History of AF but no anticoagulation started with Chadsvasc 0 and high risk for falls at the time. Pharmacy consulted to dose heparin .  Goal of Therapy:  Heparin  level 0.3-0.7 units/ml Monitor platelets by anticoagulation protocol: Yes   1/23 PM: Heparin  level therapeutic at 0.40. Will continue at current rate and recheck with AM labs.  Plan:  Heparin  infusion at 1400 units/hr AM heparin  level  Daily heparin  level, CBC, and monitoring for bleeding F/u  plans for anticoagulation   Thank you for allowing pharmacy to participate in this patient's care.  Larraine Brazier, PharmD Clinical Pharmacist 07/10/2024  4:15 PM **Pharmacist phone directory can now be found on [email or web address] ([email or web address]).  Listed under [facility].        [1]  Allergies Allergen Reactions   Codeine Other (See Comments)    Makes me really sick, I got hallucinations when the dose was really high 08/13/20 Pt also states he is very sensitive to strong pain medications   Crestor  [Rosuvastatin ]     Bad fatigue   Hydrocodone Nausea And Vomiting   Mounjaro  [Tirzepatide ]     Dizziness and nausea   Penicillins Other (See Comments)    I don't remember, but mom said don't take 08/13/20   Sudafed [Pseudoephedrine Hcl]     palpitations

## 2024-07-10 NOTE — ED Notes (Signed)
 Called [facility] to transport the Patient to [place] [facility] rm# 3

## 2024-07-10 NOTE — Plan of Care (Signed)

## 2024-07-10 NOTE — H&P (Addendum)
 " Virtual History and Physical      Referring Provider: Carlin Gula Telemedicine Provider: Donalda Applebaum MD Provider Location: [facility] Patient Location: [facility] Referring Diagnosis: Afib RVR Patient Name and DOB verified: yes Patient consented to Telemedicine Evaluation:yes RN virtual assistant: Deena Fell RN Video encounter time and date:07/10/24 at 6:24 am   Patient: Glen [name] DOB: 07-08-1965 PCP: Garald Karlynn GAILS, MD    Chief Complaint:  Chief Complaint  Patient presents with   Palpitations   HPI: Glen [name] is a 59 y.o. male with medical history significant of HTN, PAF (felt to be secondary to EtOH use-not on anticoagulation)-presented with palpitations.  Per patient-yesterday evening around 11 PM-he woke up with a bout of palpitations-subsequently this resolved-woke up again a few hours later with palpitations/fatigue-he has a EKG monitoring device at home-when he used the device-it indicated that he was not A-fib with heart rate in the 140s-150s.  He subsequently presented to [facility]-where further EKG/telemetry monitoring confirmed A-fib with RVR.  He was subsequently started on Cardizem infusion-the hospitalist service was then asked to admit this patient.  The patient-he has had issues with intermittent A-fib for the past several years-he sees Dr. Pietro.  He has been prescribed metoprolol -but since he has not had no issues for the past 1 year or so-he has not been taking metoprolol .  He was recently seen by his primary care practitioner on 1/16-and found to have significantly elevated blood pressure-191/100-he was started on valsartan  and clonidine .  He has no history of CVA/TIA, CAD or PAD.   No history of fever No headache No chest pain during these episodes No nausea or vomiting No abdominal pain No hematochezia/melena/hematemesis.   Review of Systems: As mentioned in the history of present  illness. All other systems reviewed and are negative. Past Medical History:  Diagnosis Date   Allergic rhinitis    Anxiety    Arthritis    Asthmatic bronchitis    Barrett esophagus    Dry eyes    GERD (gastroesophageal reflux disease)    Headache    prone to migraines   Hepatitis B infection 2008   recovered   Hx of adenomatous polyp of colon 2012   Hypertension    Hypothyroidism 2010   Dr. Gaither Music, been tested since and this is an inacurrate diagnosis   Internal hemorrhoids    MRSA infection    (15 years ago, left neck area)   PONV (postoperative nausea and vomiting)    Ulcerative proctitis (HCC)    Urethritis 2010   Vitamin D  deficiency    Past Surgical History:  Procedure Laterality Date   ANTERIOR CERVICAL DECOMP/DISCECTOMY FUSION N/A 08/31/2020   Procedure: ANTERIOR CERVICAL DECOMPRESSION/DISCECTOMY FUSION, INTERBODY PROSTHESIS, PLATE/SCREWS CERVICAL FIVE-SIX, CERVICAL SIX-SEVEN;  Surgeon: Mavis Purchase, MD;  Location: [facility] OR;  Service: Neurosurgery;  Laterality: N/A;  ANTERIOR CERVICAL DECOMPRESSION/DISCECTOMY FUSION, INTERBODY PROSTHESIS, PLATE/SCREWS CERVICAL FIVE-SIX, CERVICAL SIX-SEVEN   COLONOSCOPY     multiple - adenoma 2012   COSMETIC SURGERY     ESOPHAGOGASTRODUODENOSCOPY     multiple - barrett's   FACIAL COSMETIC SURGERY     HEMORRHOID BANDING     [place]   LIPOSUCTION     SPINE SURGERY     Neck surgery   TEAR DUCT PROBING  2009   UMBILICAL HERNIA REPAIR N/A 11/09/2021   Procedure: OPEN UMBILICAL HERNIA REPAIR WITH MESH;  Surgeon: Vernetta Berg, MD;  Location: [facility];  Service: General;  Laterality:  N/A;   Social History:  reports that he quit smoking about 25 years ago. His smoking use included cigarettes. He has a 15 pack-year smoking history. He has never used smokeless tobacco. He reports current alcohol use of about 8.0 standard drinks of alcohol per week. He reports that he does not use drugs.  Allergies[1]  Family  History  Problem Relation Age of Onset   Stroke Mother    Lupus Mother    Arthritis Mother    Anxiety disorder Mother    Asthma Mother    Hypertension Mother    Heart disease Paternal Uncle 25       atrial fib   Arthritis Maternal Grandmother    Heart disease Paternal Grandfather 69       MI   Depression Other    Hypertension Other    Stroke Other    Pancreatic cancer Other    Liver cancer Other    Colon cancer Neg Hx    Esophageal cancer Neg Hx    Stomach cancer Neg Hx    Rectal cancer Neg Hx     Prior to Admission medications  Medication Sig Start Date End Date Taking? Authorizing Provider  ALPRAZolam  (XANAX ) 0.25 MG tablet Take 1-2 tablets (0.25-0.5 mg total) by mouth 3 (three) times daily as needed for anxiety (Panic attacks). 07/08/24   Plotnikov, Aleksei V, MD  aspirin-acetaminophen -caffeine (EXCEDRIN MIGRAINE) 250-250-65 MG tablet Take 1 tablet by mouth as needed for headache.    [provider]  Azelastine -Fluticasone  137-50 MCG/ACT SUSP Place 1 spray into the nose every 12 (twelve) hours. 04/20/24   Plotnikov, Aleksei V, MD  cefdinir  (OMNICEF ) 300 MG capsule Take 1 capsule (300 mg total) by mouth 2 (two) times daily. 08/25/23   Plotnikov, Karlynn GAILS, MD  citalopram  (CELEXA ) 20 MG tablet Take 1/2 (one-half) tablet by mouth once daily 04/22/24   Plotnikov, Aleksei V, MD  cloNIDine  (CATAPRES ) 0.1 MG tablet Take 1 tablet (0.1 mg total) by mouth 3 (three) times daily as needed. 07/08/24   Plotnikov, Aleksei V, MD  diclofenac  Sodium (VOLTAREN ) 1 % GEL Apply 2 g topically as needed. 08/29/21   [provider]  fluticasone  (FLONASE ) 50 MCG/ACT nasal spray Place 2 sprays into both nostrils 2 (two) times daily. 12/12/23   Soldatova, Liuba, MD  icosapent  Ethyl (VASCEPA ) 1 g capsule Take 2 capsules (2 g total) by mouth 2 (two) times daily. 05/04/24   Plotnikov, Aleksei V, MD  levocetirizine (XYZAL  ALLERGY 24HR) 5 MG tablet Take 1 tablet (5 mg total) by mouth every evening.  04/20/24   Plotnikov, Aleksei V, MD  LUMIFY 0.025 % SOLN SMARTSIG:1 Drop(s) In Eye(s) PRN 05/23/21   [provider]  mesalamine  (CANASA ) 1000 MG suppository Place 1 suppository (1,000 mg total) rectally at bedtime. Patient taking differently: Place 1,000 mg rectally as needed. 07/12/22   Avram Lupita BRAVO, MD  methylPREDNISolone  (MEDROL  DOSEPAK) 4 MG TBPK tablet As directed 09/09/23   Plotnikov, Aleksei V, MD  omeprazole  (PRILOSEC) 40 MG capsule Take 1 capsule (40 mg total) by mouth daily. 04/22/24   Plotnikov, Aleksei V, MD  ondansetron  (ZOFRAN -ODT) 4 MG disintegrating tablet Take 4 mg by mouth every 8 (eight) hours as needed. 02/07/23   [provider]  SUMAtriptan  (IMITREX ) 20 MG/ACT nasal spray Place 1 spray (20 mg total) into the nose every 2 (two) hours as needed for migraine or headache. May repeat in 2 hours if headache persists or recurs. 06/05/22   Plotnikov, Karlynn GAILS, MD  valACYclovir  (VALTREX ) 500 MG tablet TAKE 1 TABLET BY MOUTH TWICE DAILY AS NEEDED FOR  FEVER  BLISTERS.  NEEDS  APPOINTMENT  FOR  MORE  REFILLS 04/27/24   Plotnikov, Karlynn GAILS, MD  valsartan  (DIOVAN ) 40 MG tablet Take 1 tablet (40 mg total) by mouth 2 (two) times daily. 07/08/24   Plotnikov, Aleksei V, MD  zolmitriptan  (ZOMIG ) 5 MG tablet Take 1 tablet (5 mg total) by mouth as needed for migraine. 06/05/22   Plotnikov, Karlynn GAILS, MD    Physical Exam: Done by Wende Fell at bedside during this encounter Vitals:   07/10/24 0530 07/10/24 0545 07/10/24 0700 07/10/24 0731  BP: 124/87 111/75    Pulse: 67 96    Resp: 17 17    Temp:    98.2 F (36.8 C)  TempSrc:    Oral  SpO2: 99% 98%    Weight:   101.6 kg   Height:   6' 1 (1.854 m)    Gen Exam:Alert awake-not in any distress HEENT:atraumatic, normocephalic Chest: B/L clear to auscultation anteriorly CVS:S1S2 irregular (heart rate in the 120s during this virtual encounter) Abdomen:soft non tender, non distended Extremities:no edema Neurology:  Non focal Skin: no rash   Data Reviewed:    Latest Ref Rng & Units 07/10/2024    2:58 AM 07/07/2024    4:34 PM 04/20/2024    9:58 AM  CBC  WBC 4.0 - 10.5 K/uL 5.2  9.1  4.7   Hemoglobin 13.0 - 17.0 g/dL 84.4  84.4  84.1   Hematocrit 39.0 - 52.0 % 44.1  43.5  46.4   Platelets 150 - 400 K/uL 180  185  231.0     BMET    Component Value Date/Time   NA 138 07/10/2024 0258   K 3.8 07/10/2024 0258   CL 102 07/10/2024 0258   CO2 23 07/10/2024 0258   GLUCOSE 106 (H) 07/10/2024 0258   BUN 22 (H) 07/10/2024 0258   CREATININE 0.86 07/10/2024 0258   CALCIUM  9.7 07/10/2024 0258   GFRNONAA >60 07/10/2024 0258      Assessment and Plan: A-fib RVR Rate in the 120s-remains on Cardizem drip-with titration protocol Although his CHADS2/VASc score is just 1-I will go ahead and start him on IV heparin until we can get an echocardiogram-and if he persisted remains in A-fib-he might need cardioversion Will get cardiology input when he arrives at  [facility] TSH is stable  Addendum: Now in NSR-start metoprolol  and stop cardizem gtt, still awaiting on Echo  HTN Hold all antihypertensives for now-allow room for BP so that he can tolerate Cardizem infusion.  GERD PPI  Anxiety/depression Seems stable Continue as needed Xanax  Continue Celexa   Prior history of EtOH use Claims he has been abstinent for months/years No signs of withdrawal symptoms Watch for any signs of withdrawal symptoms and start Ativan per CIWA protocol   Advance Care Planning:   Code Status: Full Code   Consults: None  Family Communication: None at bedside  Severity of Illness: The appropriate patient status for this patient is INPATIENT. Inpatient status is judged to be reasonable and necessary in order to provide the required intensity of service to ensure the patient's safety. The patient's presenting symptoms, physical exam findings, and initial radiographic and laboratory data in the context of their chronic  comorbidities is felt to place them at high risk for further clinical deterioration. Furthermore, it is not anticipated that the patient will be medically stable for discharge from the hospital within 2  midnights of admission.   * I certify that at the point of admission it is my clinical judgment that the patient will require inpatient hospital care spanning beyond 2 midnights from the point of admission due to high intensity of service, high risk for further deterioration and high frequency of surveillance required.*  Author: Donalda Applebaum, MD 07/10/2024 8:45 AM  For on call review [email or web address].      [1]  Allergies Allergen Reactions   Codeine Other (See Comments)    Makes me really sick, I got hallucinations when the dose was really high 08/13/20 Pt also states he is very sensitive to strong pain medications   Crestor  [Rosuvastatin ]     Bad fatigue   Hydrocodone Nausea And Vomiting   Mounjaro  [Tirzepatide ]     Dizziness and nausea   Penicillins Other (See Comments)    I don't remember, but mom said don't take 08/13/20   Sudafed [Pseudoephedrine Hcl]     palpitations   "

## 2024-07-10 NOTE — ED Notes (Signed)
[facility] transporting pt °

## 2024-07-11 ENCOUNTER — Encounter: Payer: Self-pay | Admitting: Internal Medicine

## 2024-07-11 NOTE — Assessment & Plan Note (Signed)
 Panic attacks versus other.  Use Xanax  1-2 tablets twice daily as needed

## 2024-07-11 NOTE — Assessment & Plan Note (Addendum)
 Episodes of elevated blood pressure accompanied by what sounds like a panic attack.  Reviewed emergency room, recent office visit notes, labs, EKG Will increase  valsartan  to 40 mg twice a day. Prescribed clonidine  to take 0.1 mg twice daily if blood pressure is greater than 170 Okay to use Xanax  1-2 tablets as needed for severe anxiety Obtain urine for 24-hour metanephrines if repeated attacks

## 2024-07-13 ENCOUNTER — Telehealth: Payer: Self-pay

## 2024-07-13 NOTE — Transitions of Care (Post Inpatient/ED Visit) (Signed)
 "  07/13/2024  Name: Glen [name] MRN: [id number] DOB: [birth year]  Today's TOC FU Call Status: Today's TOC FU Call Status:: Successful TOC FU Call Completed TOC FU Call Complete Date: 07/13/24  Patient's Name and Date of Birth confirmed. DOB, Name  Transition Care Management Follow-up Telephone Call Date of Discharge: 07/10/24 Discharge Facility: [facility]) Type of Discharge: Inpatient Admission Primary Inpatient Discharge Diagnosis:: Afib with RVR How have you been since you were released from the hospital?: Better Any questions or concerns?: No  Items Reviewed: Did you receive and understand the discharge instructions provided?: Yes Medications obtained,verified, and reconciled?: Yes (Medications Reviewed) Any new allergies since your discharge?: No Dietary orders reviewed?: NA Do you have support at home?: Yes People in Home [RPT]: spouse  Medications Reviewed Today: Medications Reviewed Today     Reviewed by Lavelle Charmaine NOVAK, LPN (Licensed Practical Nurse) on 07/13/24 at 1450  Med List Status: <None>   Medication Order Taking? Sig Documenting Provider Last Dose Status Informant  ALPRAZolam  (XANAX ) 0.25 MG tablet [id number] No Take 1-2 tablets (0.25-0.5 mg total) by mouth 3 (three) times daily as needed for anxiety (Panic attacks). Plotnikov, Aleksei V, MD 07/09/2024 Evening Active Self  apixaban  (ELIQUIS ) 5 MG TABS tablet [id number]  Take 1 tablet (5 mg total) by mouth 2 (two) times daily. Arlice Reichert, MD  Active   Azelastine -Fluticasone  137-50 MCG/ACT SUSP [id number] No Place 1 spray into the nose every 12 (twelve) hours. Plotnikov, Aleksei V, MD Past Month Active Self  citalopram  (CELEXA ) 20 MG tablet [id number] No Take 1/2 (one-half) tablet by mouth once daily Plotnikov, Aleksei V, MD 07/09/2024 Morning Active Self  diclofenac  Sodium (VOLTAREN ) 1 % GEL [id number] No Apply 2 g topically daily as needed (for pain). [provider] Past Month Active Self   fluticasone  (FLONASE ) 50 MCG/ACT nasal spray [id number] No Place 2 sprays into both nostrils 2 (two) times daily. Soldatova, Liuba, MD Past Month Active Self  icosapent  Ethyl (VASCEPA ) 1 g capsule [id number] No Take 2 capsules (2 g total) by mouth 2 (two) times daily. Plotnikov, Aleksei V, MD 07/09/2024 Morning Active Self  levocetirizine (XYZAL  ALLERGY 24HR) 5 MG tablet [id number] No Take 1 tablet (5 mg total) by mouth every evening. Plotnikov, Aleksei V, MD 07/09/2024 Evening Active Self  LUMIFY 0.025 % SOLN [id number] No SMARTSIG:1 Drop(s) In Eye(s) PRN [provider] 07/09/2024 Morning Active Self  metoprolol  tartrate (LOPRESSOR ) 25 MG tablet [id number]  Take 1 tablet (25 mg total) by mouth 2 (two) times daily. Arlice Reichert, MD  Active   omeprazole  (PRILOSEC) 40 MG capsule [id number] No Take 1 capsule (40 mg total) by mouth daily. Plotnikov, Aleksei V, MD 07/09/2024 Morning Active Self  ondansetron  (ZOFRAN -ODT) 4 MG disintegrating tablet [id number] No Take 4 mg by mouth every 8 (eight) hours as needed for nausea or vomiting. [provider] Past Month Active Self  SUMAtriptan  (IMITREX ) 20 MG/ACT nasal spray [id number] No Place 1 spray (20 mg total) into the nose every 2 (two) hours as needed for migraine or headache. May repeat in 2 hours if headache persists or recurs. Plotnikov, Aleksei V, MD Past Month Active Self  valACYclovir  (VALTREX ) 500 MG tablet [id number] No TAKE 1 TABLET BY MOUTH TWICE DAILY AS NEEDED FOR  FEVER  BLISTERS.  NEEDS  APPOINTMENT  FOR  MORE  REFILLS Plotnikov, Aleksei V, MD Past Month Active Self           Med Note (SATTERFIELD, TEENA E   Fri Jul 10, 2024  5:12 PM) Patient verified he only take as needed   valsartan  (DIOVAN ) 40 MG tablet [id number] No Take 1 tablet (40 mg total) by mouth 2 (two) times daily.  Patient taking differently: Take 40 mg by mouth daily.   Plotnikov, Aleksei V, MD 07/09/2024 Morning Active Self  zolmitriptan  (ZOMIG ) 5 MG tablet [id number] No  Take 1 tablet (5 mg total) by mouth as needed for migraine. Plotnikov, Karlynn GAILS, MD Past Month Active Self            Home Care and Equipment/Supplies: Were Home Health Services Ordered?: NA Any new equipment or medical supplies ordered?: NA  Functional Questionnaire: Do you need assistance with bathing/showering or dressing?: No Do you need assistance with meal preparation?: No Do you need assistance with eating?: No Do you have difficulty maintaining continence: No Do you need assistance with getting out of bed/getting out of a chair/moving?: No Do you have difficulty managing or taking your medications?: No  Follow up appointments reviewed: PCP Follow-up appointment confirmed?: Yes Date of PCP follow-up appointment?: 07/21/24 Follow-up Provider: Dr. Garald Specialist [facility] Follow-up appointment confirmed?: Yes Date of Specialist follow-up appointment?: 08/05/24 Follow-Up Specialty Provider:: Cardiology Do you need transportation to your follow-up appointment?: No Do you understand care options if your condition(s) worsen?: Yes-patient verbalized understanding    SIGNATURE Charmaine Bloodgood, LPN [facility] Health Advisor [facility] You Are. We Are. [facility] [phone]. "

## 2024-07-14 ENCOUNTER — Encounter: Payer: Self-pay | Admitting: Internal Medicine

## 2024-07-14 ENCOUNTER — Ambulatory Visit: Payer: Self-pay | Admitting: Internal Medicine

## 2024-07-14 ENCOUNTER — Encounter: Payer: Self-pay | Admitting: Cardiology

## 2024-07-14 ENCOUNTER — Ambulatory Visit: Payer: Self-pay | Admitting: Cardiology

## 2024-07-14 VITALS — BP 145/88 | HR 73 | Ht 73.0 in | Wt 222.6 lb

## 2024-07-14 VITALS — BP 120/78 | HR 71 | Temp 98.1°F | Ht 73.0 in | Wt 222.0 lb

## 2024-07-14 DIAGNOSIS — F419 Anxiety disorder, unspecified: Secondary | ICD-10-CM

## 2024-07-14 DIAGNOSIS — I480 Paroxysmal atrial fibrillation: Secondary | ICD-10-CM

## 2024-07-14 DIAGNOSIS — R0683 Snoring: Secondary | ICD-10-CM

## 2024-07-14 DIAGNOSIS — I10 Essential (primary) hypertension: Secondary | ICD-10-CM

## 2024-07-14 DIAGNOSIS — I4891 Unspecified atrial fibrillation: Secondary | ICD-10-CM

## 2024-07-14 MED ORDER — CITALOPRAM HYDROBROMIDE 20 MG PO TABS: ORAL_TABLET | ORAL | 3 refills | Status: AC

## 2024-07-14 NOTE — Progress Notes (Signed)
 "    HPI: Follow-up atrial fibrillation.  Calcium  score 2022 0.  Echocardiogram September 2024 showed normal LV function, grade 1 diastolic dysfunction, mild aortic insufficiency.  Monitor October 2024 showed sinus rhythm with 2 runs of ventricular tachycardia longest 6 beats, less than 1% ventricular and supraventricular ectopy.  Previously documented to have episodes of atrial fibrillation on his [employer] monitor.  Seen in the emergency room January 23 with recurrent atrial fibrillation.  Treated with Cardizem  and converted to sinus rhythm.  Note TSH was normal.  Since last seen he has dyspnea on exertion but denies orthopnea, PND, pedal edema, chest pain or syncope.  Current Outpatient Medications  Medication Sig Dispense Refill   ALPRAZolam  (XANAX ) 0.25 MG tablet Take 1-2 tablets (0.25-0.5 mg total) by mouth 3 (three) times daily as needed for anxiety (Panic attacks). 60 tablet 1   apixaban  (ELIQUIS ) 5 MG TABS tablet Take 1 tablet (5 mg total) by mouth 2 (two) times daily. 60 tablet 0   Azelastine -Fluticasone  137-50 MCG/ACT SUSP Place 1 spray into the nose every 12 (twelve) hours. 69 g 3   citalopram  (CELEXA ) 20 MG tablet 1 po qd 90 tablet 3   diclofenac  Sodium (VOLTAREN ) 1 % GEL Apply 2 g topically daily as needed (for pain).     fluticasone  (FLONASE ) 50 MCG/ACT nasal spray Place 2 sprays into both nostrils 2 (two) times daily. 16 g 6   icosapent  Ethyl (VASCEPA ) 1 g capsule Take 2 capsules (2 g total) by mouth 2 (two) times daily. 120 capsule 11   levocetirizine (XYZAL  ALLERGY 24HR) 5 MG tablet Take 1 tablet (5 mg total) by mouth every evening. 90 tablet 3   LUMIFY 0.025 % SOLN SMARTSIG:1 Drop(s) In Eye(s) PRN     metoprolol  tartrate (LOPRESSOR ) 25 MG tablet Take 1 tablet (25 mg total) by mouth 2 (two) times daily. 60 tablet 0   omeprazole  (PRILOSEC) 40 MG capsule Take 1 capsule (40 mg total) by mouth daily. 90 capsule 3   ondansetron  (ZOFRAN -ODT) 4 MG disintegrating tablet Take 4 mg by mouth  every 8 (eight) hours as needed for nausea or vomiting.     SUMAtriptan  (IMITREX ) 20 MG/ACT nasal spray Place 1 spray (20 mg total) into the nose every 2 (two) hours as needed for migraine or headache. May repeat in 2 hours if headache persists or recurs. 1 each 5   valACYclovir  (VALTREX ) 500 MG tablet TAKE 1 TABLET BY MOUTH TWICE DAILY AS NEEDED FOR  FEVER  BLISTERS.  NEEDS  APPOINTMENT  FOR  MORE  REFILLS 30 tablet 0   zolmitriptan  (ZOMIG ) 5 MG tablet Take 1 tablet (5 mg total) by mouth as needed for migraine. 10 tablet 3   No current facility-administered medications for this visit.     Past Medical History:  Diagnosis Date   Allergic rhinitis    Anxiety    Arthritis    Asthmatic bronchitis    Barrett esophagus    Dry eyes    GERD (gastroesophageal reflux disease)    Headache    prone to migraines   Hepatitis B infection 2008   recovered   Hx of adenomatous polyp of colon 2012   Hypertension    Hypothyroidism 2010   Dr. Gaither Music, been tested since and this is an inacurrate diagnosis   Internal hemorrhoids    MRSA infection    (15 years ago, left neck area)   PONV (postoperative nausea and vomiting)    Ulcerative proctitis (HCC)  Urethritis 2010   Vitamin D  deficiency     Past Surgical History:  Procedure Laterality Date   ANTERIOR CERVICAL DECOMP/DISCECTOMY FUSION N/A 08/31/2020   Procedure: ANTERIOR CERVICAL DECOMPRESSION/DISCECTOMY FUSION, INTERBODY PROSTHESIS, PLATE/SCREWS CERVICAL FIVE-SIX, CERVICAL SIX-SEVEN;  Surgeon: Mavis Purchase, MD;  Location: [facility] OR;  Service: Neurosurgery;  Laterality: N/A;  ANTERIOR CERVICAL DECOMPRESSION/DISCECTOMY FUSION, INTERBODY PROSTHESIS, PLATE/SCREWS CERVICAL FIVE-SIX, CERVICAL SIX-SEVEN   COLONOSCOPY     multiple - adenoma 2012   COSMETIC SURGERY     ESOPHAGOGASTRODUODENOSCOPY     multiple - barrett's   FACIAL COSMETIC SURGERY     HEMORRHOID BANDING     [place]   LIPOSUCTION     SPINE SURGERY     Neck surgery   TEAR  DUCT PROBING  2009   UMBILICAL HERNIA REPAIR N/A 11/09/2021   Procedure: OPEN UMBILICAL HERNIA REPAIR WITH MESH;  Surgeon: Vernetta Berg, MD;  Location: [facility];  Service: General;  Laterality: N/A;    Social History   Socioeconomic History   Marital status: Married    Spouse name: Not on file   Number of children: Not on file   Years of education: Not on file   Highest education level: GED or equivalent  Occupational History   Not on file  Tobacco Use   Smoking status: Former    Current packs/day: 0.00    Average packs/day: 1 pack/day for 15.0 years (15.0 ttl pk-yrs)    Types: Cigarettes    Quit date: 06/19/1999    Years since quitting: 25.0   Smokeless tobacco: Never  Vaping Use   Vaping status: Never Used  Substance and Sexual Activity   Alcohol use: Yes    Alcohol/week: 8.0 standard drinks of alcohol    Types: 3 Glasses of wine, 5 Standard drinks or equivalent per week    Comment: a glass of wine each night.   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   Former smoker, 1 alcoholic beverage a day 2 caffeinated beverages a day no drug use or tobacco now   Regular Exercise- yes   Social Drivers of Health   Tobacco Use: Medium Risk (07/14/2024)   Patient History    Smoking Tobacco Use: Former    Smokeless Tobacco Use: Never    Passive Exposure: Not on file  Financial Resource Strain: Low Risk (04/20/2024)   Overall Financial Resource Strain (CARDIA)    Difficulty of Paying Living Expenses: Not hard at all  Food Insecurity: No Food Insecurity (07/10/2024)   Epic    Worried About Radiation Protection Practitioner of Food in the Last Year: Never true    Ran Out of Food in the Last Year: Never true  Transportation Needs: No Transportation Needs (07/10/2024)   Epic    Lack of Transportation  (Medical): No    Lack of Transportation (Non-Medical): No  Physical Activity: Sufficiently Active (04/20/2024)   Exercise Vital Sign    Days of Exercise per Week: 3 days    Minutes of Exercise per Session: 90 min  Stress: No Stress Concern Present (04/20/2024)   [employer] of Occupational Health - Occupational Stress Questionnaire    Feeling of Stress: Not at all  Social Connections: Moderately Isolated (07/10/2024)   Social Connection and Isolation Panel    Frequency  of Communication with Friends and Family: More than three times a week    Frequency of Social Gatherings with Friends and Family: Never    Attends Religious Services: Never    Database Administrator or Organizations: No    Attends Banker Meetings: Never    Marital Status: Married  Catering Manager Violence: Not At Risk (07/10/2024)   Epic    Fear of Current or Ex-Partner: No    Emotionally Abused: No    Physically Abused: No    Sexually Abused: No  Depression (PHQ2-9): Low Risk (04/15/2023)   Depression (PHQ2-9)    PHQ-2 Score: 0  Alcohol Screen: Low Risk (04/20/2024)   Alcohol Screen    Last Alcohol Screening Score (AUDIT): 3  Housing: Low Risk (07/10/2024)   Epic    Unable to Pay for Housing in the Last Year: No    Number of Times Moved in the Last Year: 0    Homeless in the Last Year: No  Utilities: Not At Risk (07/10/2024)   Epic    Threatened with loss of utilities: No  Health Literacy: Not on file    Family History  Problem Relation Age of Onset   Stroke Mother    Lupus Mother    Arthritis Mother    Anxiety disorder Mother    Asthma Mother    Hypertension Mother    Heart disease Paternal Uncle 66       atrial fib   Arthritis Maternal Grandmother    Heart disease Paternal Grandfather 23       MI   Depression Other    Hypertension Other    Stroke Other    Pancreatic cancer Other    Liver cancer Other    Colon cancer Neg Hx    Esophageal cancer Neg Hx    Stomach cancer Neg Hx     Rectal cancer Neg Hx     ROS: no fevers or chills, productive cough, hemoptysis, dysphasia, odynophagia, melena, hematochezia, dysuria, hematuria, rash, seizure activity, orthopnea, PND, pedal edema, claudication. Remaining systems are negative.  Physical Exam: Well-developed well-nourished in no acute distress.  Skin is warm and dry.  HEENT is normal.  Neck is supple.  Chest is clear to auscultation with normal expansion.  Cardiovascular exam is regular rate and rhythm.  Abdominal exam nontender or distended. No masses palpated. Extremities show no edema. neuro grossly intact  ECG-July 10, 2024-atrial fibrillation with rapid ventricular response and nonspecific ST changes.  Personally reviewed  A/P  1 paroxysmal atrial fibrillation-patient is in sinus rhythm today on exam.  Continue metoprolol .  Repeat echocardiogram.  His CHA2DS2-VASc is 0 (he has had elevated blood pressure at times but he follows this at home and it is typically normal but increases predominantly when he is anxious).  He is on apixaban  for 4 weeks status post self cardioversion.  Discontinue 4 weeks after recent initiation.  Screen for obstructive sleep apnea with sleep study.  Will refer to electrophysiology for consideration of ablation given recurrent symptoms.  2 hyperlipidemia-per primary care.   Glen Shallow, MD    "

## 2024-07-14 NOTE — Assessment & Plan Note (Signed)
 Due to recent A-fib with rapid ventricular response, he re-started  Metoprolol , can handle 25 mg at bedtime only.  He has side effects with 2 tablets per day.  Diovan  was discontinued He has clonidine  prescription just in case blood pressure goes up high F/u w/Cardiology.  Glen [name] is interested in cardiac ablation option

## 2024-07-14 NOTE — Assessment & Plan Note (Addendum)
 Panic attacks versus other. PTSD - Brother was killed 2013 Increase Celexa  dose to 1 daily, watch for weight gain Alprazolam  as needed rare use  Potential benefits of a long term benzodiazepines  use as well as potential risks  and complications were explained to the patient and were aknowledged.

## 2024-07-14 NOTE — Assessment & Plan Note (Addendum)
 He re-started  Metoprolol , can handle 25 mg at bedtime only.  He has side effects with 2 tablets per day. On Eliquis  F/u w/Cardiology.  Glen [name] is interested in cardiac ablation option  Recent A fib w/RVR - converted on Cardizem  drip in 12 hrs after the onset

## 2024-07-14 NOTE — Progress Notes (Addendum)
 "  Subjective:  Patient ID: Glen [name], male    DOB: March 03, 1966  Age: 59 y.o. MRN: [id number]  CC: Hospitalization Follow-up   HPI Glen [name] presents for A fib w/RVR - converted on Cardizem  drip In 12 hrs Off Valsartan  He re-started  Metoprolol , can handle 25 mg at bedtime only Per hx:   Expand All Collapse All     Physician Discharge Summary  Glen [name] FMW:[id number] DOB: [birth year] DOA: 07/10/2024   PCP: Glen Glen GAILS, MD   Admit date: 07/10/2024 Discharge date: 07/10/2024   Admitted from: Home Discharge disposition: Home   Recommendations at discharge:  For A-fib, you have been started on metoprolol  25 mg twice daily and Eliquis  5 mg twice daily.  Continue to monitor symptoms. Continue valsartan  as before.  Continue to monitor blood pressure at home. Follow-up with cardiology as an outpatient     Brief narrative: Glen [name] is a 59 y.o. male with PMH significant for HTN, paroxysmal A-fib not on anticoagulation, past alcohol use, GERD, Barrett's esophagus, hypothyroidism. Very early this morning, patient presented to ED at [facility] with complaint of palpitation, fatigue.  He woke up from his sleep around 11 PM last night with palpitation which subsequently resolved but he woke up again in few hours with palpitation/fatigue.  He has an EKG monitoring device at home which did not indicate A-fib but showed a heart rate of 140-150s and hence he presented to the ED.   In the ED, he was tachycardic to 150s, confirmed A-fib with RVR with EKG CBC, CMP, troponin unremarkable Started on Cardizem  infusion, heparin  drip Hospitalist service was consulted.  Virtual admission was done earlier this morning.   Vitals from this afternoon reviewed. Heart rate in 60s, blood pressure in 120s, breathing on room air. Converted to normal sinus rhythm this afternoon and was switched from Cardizem  drip to metoprolol    I evaluated the patient after he arrived to  [facility] this afternoon. Patient has had issues with intermittent A-fib since 2024.  Was first noted in [employer] mobile device for the heart monitor later did not show any evidence of A-fib.  He was started on metoprolol  as needed but apparently has not required that for a long time.  Intermittent A-fib was suspected to be due to regular alcohol use. Also recalled an episode of palpitations 3 weeks ago. Reports his last alcoholic drink was months ago.   Also, recently seen by his PCP on 1/16-and found to have significantly elevated blood pressure-191/100 and started on valsartan  and clonidine .  He reports he started valsartan  but read about clonidine  and did not start it.   Hospital course: A-fib RVR H/o paroxysmal A-fib Presented with symptomatic episode of palpitations last night.  Also reported previous episode 3 weeks ago. Started on heparin  drip and Cardizem  drip in the ED Converted to normal sinus rhythm this afternoon and was switched from Cardizem  drip to metoprolol  TSH normal. RVR probably was precipitated by uncontrolled blood pressure To be seen by cardiology here.  Switched from heparin  drip to Eliquis . Cleared for discharge home today   HTN PTA meds-reports recently started on valsartan  Continue same along with beta-blocker continue monitor blood pressure at home   GERD PPI   Anxiety/depression Seems stable Continue Celexa , as needed Xanax  as before   Goals of care   Code Status: Full Code    Diet:  Diet Order  Diet Heart Room service appropriate? Yes; Fluid consistency: Thin  Diet effective now             Diet general                          Outpatient Medications Prior to Visit  Medication Sig Dispense Refill   ALPRAZolam  (XANAX ) 0.25 MG tablet Take 1-2 tablets (0.25-0.5 mg total) by mouth 3 (three) times daily as needed for anxiety (Panic attacks). 60 tablet 1   apixaban  (ELIQUIS ) 5 MG TABS tablet Take 1 tablet (5 mg total) by  mouth 2 (two) times daily. 60 tablet 0   Azelastine -Fluticasone  137-50 MCG/ACT SUSP Place 1 spray into the nose every 12 (twelve) hours. 69 g 3   diclofenac  Sodium (VOLTAREN ) 1 % GEL Apply 2 g topically daily as needed (for pain).     fluticasone  (FLONASE ) 50 MCG/ACT nasal spray Place 2 sprays into both nostrils 2 (two) times daily. 16 g 6   icosapent  Ethyl (VASCEPA ) 1 g capsule Take 2 capsules (2 g total) by mouth 2 (two) times daily. 120 capsule 11   levocetirizine (XYZAL  ALLERGY 24HR) 5 MG tablet Take 1 tablet (5 mg total) by mouth every evening. 90 tablet 3   LUMIFY 0.025 % SOLN SMARTSIG:1 Drop(s) In Eye(s) PRN     metoprolol  tartrate (LOPRESSOR ) 25 MG tablet Take 1 tablet (25 mg total) by mouth 2 (two) times daily. 60 tablet 0   omeprazole  (PRILOSEC) 40 MG capsule Take 1 capsule (40 mg total) by mouth daily. 90 capsule 3   ondansetron  (ZOFRAN -ODT) 4 MG disintegrating tablet Take 4 mg by mouth every 8 (eight) hours as needed for nausea or vomiting.     SUMAtriptan  (IMITREX ) 20 MG/ACT nasal spray Place 1 spray (20 mg total) into the nose every 2 (two) hours as needed for migraine or headache. May repeat in 2 hours if headache persists or recurs. 1 each 5   valACYclovir  (VALTREX ) 500 MG tablet TAKE 1 TABLET BY MOUTH TWICE DAILY AS NEEDED FOR  FEVER  BLISTERS.  NEEDS  APPOINTMENT  FOR  MORE  REFILLS 30 tablet 0   valsartan  (DIOVAN ) 40 MG tablet Take 1 tablet (40 mg total) by mouth 2 (two) times daily. (Patient taking differently: Take 40 mg by mouth daily.) 180 tablet 3   zolmitriptan  (ZOMIG ) 5 MG tablet Take 1 tablet (5 mg total) by mouth as needed for migraine. 10 tablet 3   citalopram  (CELEXA ) 20 MG tablet Take 1/2 (one-half) tablet by mouth once daily 90 tablet 3   No facility-administered medications prior to visit.    ROS: Review of Systems  Constitutional:  Negative for appetite change, fatigue and unexpected weight change.  HENT:  Negative for congestion, nosebleeds, sneezing, sore  throat and trouble swallowing.   Eyes:  Negative for itching and visual disturbance.  Respiratory:  Negative for cough.   Cardiovascular:  Negative for chest pain, palpitations and leg swelling.  Gastrointestinal:  Negative for abdominal distention, blood in stool, diarrhea and nausea.  Genitourinary:  Negative for frequency and hematuria.  Musculoskeletal:  Negative for back pain, gait problem, joint swelling and neck pain.  Skin:  Negative for rash.  Neurological:  Negative for dizziness, tremors, speech difficulty and weakness.  Psychiatric/Behavioral:  Negative for agitation, dysphoric mood, sleep disturbance and suicidal ideas. The patient is nervous/anxious.     Objective:  BP 120/78   Pulse 71   Temp 98.1 F (36.7 C) (  Oral)   Ht 6' 1 (1.854 m)   Wt 222 lb (100.7 kg)   SpO2 99%   BMI 29.29 kg/m   BP Readings from Last 3 Encounters:  07/14/24 120/78  07/10/24 134/83  07/08/24 120/80    Wt Readings from Last 3 Encounters:  07/14/24 222 lb (100.7 kg)  07/10/24 223 lb 15.8 oz (101.6 kg)  07/08/24 224 lb (101.6 kg)    Physical Exam Constitutional:      General: He is not in acute distress.    Appearance: Normal appearance. He is well-developed.     Comments: NAD  Eyes:     Conjunctiva/sclera: Conjunctivae normal.     Pupils: Pupils are equal, round, and reactive to light.  Neck:     Thyroid : No thyromegaly.     Vascular: No JVD.  Cardiovascular:     Rate and Rhythm: Normal rate and regular rhythm.     Heart sounds: Normal heart sounds. No murmur heard.    No friction rub. No gallop.  Pulmonary:     Effort: Pulmonary effort is normal. No respiratory distress.     Breath sounds: Normal breath sounds. No wheezing or rales.  Chest:     Chest wall: No tenderness.  Abdominal:     General: Bowel sounds are normal. There is no distension.     Palpations: Abdomen is soft. There is no mass.     Tenderness: There is no abdominal tenderness. There is no guarding or  rebound.  Musculoskeletal:        General: No tenderness. Normal range of motion.     Cervical back: Normal range of motion.     Right lower leg: Edema present.     Left lower leg: No edema.  Lymphadenopathy:     Cervical: No cervical adenopathy.  Skin:    General: Skin is warm and dry.     Findings: No rash.  Neurological:     Mental Status: He is alert and oriented to person, place, and time.     Cranial Nerves: No cranial nerve deficit.     Motor: No abnormal muscle tone.     Coordination: Coordination normal.     Gait: Gait normal.     Deep Tendon Reflexes: Reflexes are normal and symmetric.  Psychiatric:        Behavior: Behavior normal.        Thought Content: Thought content normal.        Judgment: Judgment normal.   He was seen at no charge (no health insurance for another few days)  Lab Results  Component Value Date   WBC 5.2 07/10/2024   HGB 15.5 07/10/2024   HCT 44.1 07/10/2024   PLT 180 07/10/2024   GLUCOSE 106 (H) 07/10/2024   CHOL 245 (H) 04/20/2024   TRIG 91.0 04/20/2024   HDL 59.20 04/20/2024   LDLDIRECT 150.6 06/29/2013   LDLCALC 168 (H) 04/20/2024   ALT 19 04/20/2024   AST 21 04/20/2024   NA 138 07/10/2024   K 3.8 07/10/2024   CL 102 07/10/2024   CREATININE 0.86 07/10/2024   BUN 22 (H) 07/10/2024   CO2 23 07/10/2024   TSH 1.830 07/10/2024   PSA 1.45 04/20/2024   HGBA1C 5.6 04/20/2024    No results found.  Assessment & Plan:   Problem List Items Addressed This Visit     Anxiety - Primary   Panic attacks versus other. PTSD - Brother was killed 2013 Increase Celexa  dose to 1 daily, watch  for weight gain Alprazolam  as needed rare use  Potential benefits of a long term benzodiazepines  use as well as potential risks  and complications were explained to the patient and were aknowledged.       Relevant Medications   citalopram  (CELEXA ) 20 MG tablet   HTN (hypertension)   Due to recent A-fib with rapid ventricular response, he re-started   Metoprolol , can handle 25 mg at bedtime only.  He has side effects with 2 tablets per day.  Diovan  was discontinued He has clonidine  prescription just in case blood pressure goes up high F/u w/Cardiology.  Lemond is interested in cardiac ablation option      Paroxysmal atrial fibrillation (HCC)   F/u w/Dr Pietro He re-started  Metoprolol , can handle 25 mg at bedtime only On Eliquis  Treat anxiety      Atrial fibrillation with RVR (HCC)   He re-started  Metoprolol , can handle 25 mg at bedtime only.  He has side effects with 2 tablets per day. On Eliquis  F/u w/Cardiology.  Lemond is interested in cardiac ablation option  Recent A fib w/RVR - converted on Cardizem  drip in 12 hrs after the onset         Meds ordered this encounter  Medications   citalopram  (CELEXA ) 20 MG tablet    Sig: 1 po qd    Dispense:  90 tablet    Refill:  3      Follow-up: Return in about 3 months (around 10/12/2024) for a follow-up visit.  Marolyn Noel, MD "

## 2024-07-14 NOTE — Assessment & Plan Note (Addendum)
 F/u w/Dr Pietro He re-started  Metoprolol , can handle 25 mg at bedtime only On Eliquis  Treat anxiety

## 2024-07-14 NOTE — Patient Instructions (Signed)
 Medication Instructions:  No changes *If you need a refill on your cardiac medications before your next appointment, please call your pharmacy*  Lab Work: None ordered If you have labs (blood work) drawn today and your tests are completely normal, you will receive your results only by: MyChart Message (if you have MyChart) OR A paper copy in the mail If you have any lab test that is abnormal or we need to change your treatment, we will call you to review the results.  Testing/Procedures: Your physician has requested that you have an echocardiogram. Echocardiography is a painless test that uses sound waves to create images of your heart. It provides your doctor with information about the size and shape of your heart and how well your hearts chambers and valves are working. This procedure takes approximately one hour. There are no restrictions for this procedure. Please do NOT wear cologne, perfume, aftershave, or lotions (deodorant is allowed). Please arrive 15 minutes prior to your appointment time.  Please note: We ask at that you not bring children with you during ultrasound (echo/ vascular) testing. Due to room size and safety concerns, children are not allowed in the ultrasound rooms during exams. Our front office staff cannot provide observation of children in our lobby area while testing is being conducted. An adult accompanying a patient to their appointment will only be allowed in the ultrasound room at the discretion of the ultrasound technician under special circumstances. We apologize for any inconvenience.   WatchPAT?  Is a FDA cleared portable home sleep study test that uses a watch and 3 points of contact to monitor 7 different channels, including your heart rate, oxygen saturations, body position, snoring, and chest motion.  The study is easy to use from the comfort of your own home and accurately detect sleep apnea.  Before bed, you attach the chest sensor, attached the sleep  apnea bracelet to your nondominant hand, and attach the finger probe.  After the study, the raw data is downloaded from the watch and scored for apnea events.   For more information: [email or web address]  Patient Testing Instructions:  Do not put battery into the device until bedtime when you are ready to begin the test. Please call the support number if you need assistance after following the instructions below: 24 hour support line- [phone] or [facility] support at [phone] (option 2)  Download the [facility] WatchPAT One app through the google play store or App Store  Be sure to turn on or enable access to bluetooth in settlings on your smartphone/ device  Make sure no other bluetooth devices are on and within the vicinity of your smartphone/ device and WatchPAT watch during testing.  Make sure to leave your smart phone/ device plugged in and charging all night.  When ready for bed:  Follow the instructions step by step in the WatchPAT One App to activate the testing device. For additional instructions, including video instruction, visit the WatchPAT One video on Youtube. You can search for WatchPat One within Youtube (video is 4 minutes and 18 seconds) or enter: https://youtube/watch?v=BCce_vbiwxE Please note: You will be prompted to enter a Pin to connect via bluetooth when starting the test. The PIN will be assigned to you when you receive the test.  The device is disposable, but it recommended that you retain the device until you receive a call letting you know the study has been received and the results have been interpreted.  We will let you know if the study did not  transmit to us  properly after the test is completed. You do not need to call us  to confirm the receipt of the test.  Please complete the test within 48 hours of receiving PIN.   Frequently Asked Questions:  What is Watch Bruna one?  A single use fully disposable home sleep apnea testing device and will  not need to be returned after completion.  What are the requirements to use WatchPAT one?  The be able to have a successful watchpat one sleep study, you should have your Watch pat one device, your smart phone, watch pat one app, your PIN number and Internet access What type of phone do I need?  You should have a smart phone that uses Android 5.1 and above or any Iphone with IOS 10 and above How can I download the WatchPAT one app?  Based on your device type search for WatchPAT one app either in google play for android devices or APP store for Iphone's Where will I get my PIN for the study?  Your PIN will be provided by your physician's office. It is used for authentication and if you lose/forget your PIN, please reach out to your providers office.  I do not have Internet at home. Can I do WatchPAT one study?  WatchPAT One needs Internet connection throughout the night to be able to transmit the sleep data. You can use your home/local internet or your cellular's data package. However, it is always recommended to use home/local Internet. It is estimated that between 20MB-30MB will be used with each study.However, the application will be looking for space in the phone to start the study.  What happens if I lose internet or bluetooth connection?  During the internet disconnection, your phone will not be able to transmit the sleep data. All the data, will be stored in your phone. As soon as the internet connection is back on, the phone will being sending the sleep data. During the bluetooth disconnection, WatchPAT one will not be able to to send the sleep data to your phone. Data will be kept in the WatchPAT one until two devices have bluetooth connection back on. As soon as the connection is back on, WatchPAT one will send the sleep data to the phone.  How long do I need to wear the WatchPAT one?  After you start the study, you should wear the device at least 6 hours.  How far should I keep my  phone from the device?  During the night, your phone should be within 15 feet.  What happens if I leave the room for restroom or other reasons?  Leaving the room for any reason will not cause any problem. As soon as your get back to the room, both devices will reconnect and will continue to send the sleep data. Can I use my phone during the sleep study?  Yes, you can use your phone as usual during the study. But it is recommended to put your watchpat one on when you are ready to go to bed.  How will I get my study results?  A soon as you completed your study, your sleep data will be sent to the provider. They will then share the results with you when they are ready.    Follow-Up: At [facility], you and your health needs are our priority.  As part of our continuing mission to provide you with exceptional heart care, our providers are all part of one team.  This team includes  your primary Cardiologist (physician) and Advanced Practice Providers or APPs (Physician Assistants and Nurse Practitioners) who all work together to provide you with the care you need, when you need it.  Your next appointment:    3-4 months  Provider:   Redell Shallow, MD .in   We recommend signing up for the patient portal called MyChart.  Sign up information is provided on this After Visit Summary.  MyChart is used to connect with patients for Virtual Visits (Telemedicine).  Patients are able to view lab/test results, encounter notes, upcoming appointments, etc.  Non-urgent messages can be sent to your provider as well.   To learn more about what you can do with MyChart, go to [email or web address].

## 2024-07-15 ENCOUNTER — Telehealth: Payer: Self-pay

## 2024-07-15 ENCOUNTER — Encounter: Payer: Self-pay | Admitting: Cardiology

## 2024-07-15 MED ORDER — METOPROLOL SUCCINATE ER 25 MG PO TB24: 25.0000 mg | ORAL_TABLET | Freq: Every day | ORAL | Status: DC

## 2024-07-15 NOTE — Telephone Encounter (Signed)
 Spoke with pt, Aware of dr vertie recommendations.

## 2024-07-15 NOTE — Telephone Encounter (Signed)
 Call to patient to discuss concerns about metoprolol  extended release. On review of chart, patient was prescribed metoprolol  tartrate (lopressor ) when he left the hospital 07/10/24. Patient states he thought that the extended release formulation and the metoprolol  tartrate were the same thing. He states he thinks he has been mixing up his medications and taking extended release in the evening and lopressor  in the morning. He states that he was having light headedness on 07/12/24 and 07/13/24. He stopped taking the lopressor  and only took the metoprolol  extended release yesterday and felt better.  He states that at his visit with Dr. Pietro yesterday, he reported that he was only taking the extended release and that he thought that Dr. Pietro was fine with that. However, last night, he had a mild headache and wasn't sure if this was related to mixing up his meds.   Today he denies any lightheadedness or headache. He reports that he took his metoprolol  extended release last night and this morning, his BP was 148/80. He states he took xanax  and an hour later his BP was 126/69. His HR was 78.    Patient verbalizes understanding that he should probably be taking metoprolol  extended release OR metoprolol  tartate, but not both at this time. Patient requests clarification on which formulation/dosage he should be taking every day, patient message forwarded to Dr. Pietro.

## 2024-07-15 NOTE — Addendum Note (Signed)
 Addended by: Thijs Brunton W on: 07/15/2024 04:48 PM   Modules accepted: Orders

## 2024-07-18 ENCOUNTER — Encounter: Payer: Self-pay | Admitting: Cardiology

## 2024-07-20 ENCOUNTER — Ambulatory Visit: Payer: Self-pay | Admitting: Internal Medicine

## 2024-07-20 MED ORDER — DILTIAZEM HCL ER COATED BEADS 120 MG PO CP24: 120.0000 mg | ORAL_CAPSULE | Freq: Every day | ORAL | 3 refills | Status: AC

## 2024-07-21 ENCOUNTER — Inpatient Hospital Stay: Payer: Self-pay | Admitting: Internal Medicine

## 2024-07-22 ENCOUNTER — Telehealth: Payer: Self-pay | Admitting: Cardiology

## 2024-07-22 NOTE — Telephone Encounter (Signed)
 Pt calling to f/u on Itamar sleep study. Please advise.

## 2024-07-23 NOTE — Telephone Encounter (Signed)
**Note De-Identified Jorrell Kuster Obfuscation** I called [facility] at [phone] and was advised by Elyn BIRCH that a PA is not required for CPT Code: 04199 (Itamar-HST). Call Reference #: [id number]  I called the pt and made him aware that a PA was not required for his [facility]-HST and that it is ok for him to proceed with his study.  He states that he plans to come to our [employer] office to pick up a device with instructions on Monday 07/27/2024 during his lunch break.  He thanked me for returning his call.

## 2024-07-24 ENCOUNTER — Emergency Department (HOSPITAL_COMMUNITY): Admission: EM | Admit: 2024-07-24 | Source: Home / Self Care

## 2024-07-24 ENCOUNTER — Encounter (HOSPITAL_COMMUNITY): Payer: Self-pay | Admitting: Emergency Medicine

## 2024-07-24 ENCOUNTER — Emergency Department (HOSPITAL_COMMUNITY)

## 2024-07-24 ENCOUNTER — Other Ambulatory Visit: Payer: Self-pay

## 2024-07-24 LAB — BASIC METABOLIC PANEL WITH GFR
Anion gap: 12 (ref 5–15)
BUN: 19 mg/dL (ref 6–20)
CO2: 23 mmol/L (ref 22–32)
Calcium: 9.7 mg/dL (ref 8.9–10.3)
Chloride: 104 mmol/L (ref 98–111)
Creatinine, Ser: 0.98 mg/dL (ref 0.61–1.24)
GFR, Estimated: >60 mL/min
Glucose, Bld: 108 mg/dL — ABNORMAL HIGH (ref 70–99)
Potassium: 3.7 mmol/L (ref 3.5–5.1)
Sodium: 138 mmol/L (ref 135–145)

## 2024-07-24 LAB — CBC
HCT: 44.4 % (ref 39.0–52.0)
Hemoglobin: 15.3 g/dL (ref 13.0–17.0)
MCH: 31 pg (ref 26.0–34.0)
MCHC: 34.5 g/dL (ref 30.0–36.0)
MCV: 90.1 fL (ref 80.0–100.0)
Platelets: 295 10*3/uL (ref 150–400)
RBC: 4.93 MIL/uL (ref 4.22–5.81)
RDW: 12.2 % (ref 11.5–15.5)
WBC: 6.6 10*3/uL (ref 4.0–10.5)
nRBC: 0 % (ref 0.0–0.2)

## 2024-07-24 LAB — TROPONIN T, HIGH SENSITIVITY: Troponin T High Sensitivity: 7 ng/L (ref 0–19)

## 2024-07-24 MED ORDER — DILTIAZEM LOAD VIA INFUSION
20.0000 mg | Freq: Once | INTRAVENOUS | Status: AC
  Administered 2024-07-24: 20 mg via INTRAVENOUS
  Filled 2024-07-24: qty 20

## 2024-07-24 MED ORDER — ASPIRIN 81 MG PO CHEW: 324.0000 mg | CHEWABLE_TABLET | Freq: Once | ORAL | Status: AC

## 2024-07-24 MED ORDER — DILTIAZEM HCL-DEXTROSE 125-5 MG/125ML-% IV SOLN (PREMIX)
5.0000 mg/h | INTRAVENOUS | Status: AC
  Administered 2024-07-24: 5 mg/h via INTRAVENOUS
  Filled 2024-07-24: qty 125

## 2024-07-24 NOTE — Progress Notes (Unsigned)
 "    HPI: Follow-up atrial fibrillation. Calcium  score 2022 0. Echocardiogram September 2024 showed normal LV function, grade 1 diastolic dysfunction, mild aortic insufficiency. Monitor October 2024 showed sinus rhythm with 2 runs of ventricular tachycardia longest 6 beats, less than 1% ventricular and supraventricular ectopy. Previously documented to have episodes of atrial fibrillation on his [employer] monitor. Seen in the emergency room January 23 with recurrent atrial fibrillation. Treated with Cardizem  and converted to sinus rhythm. Note TSH was normal.  At recent office visit patient was treated with metoprolol  but he states caused fatigue.  He was then transition to Cardizem  which he also did not tolerate.  Since last seen   Current Outpatient Medications  Medication Sig Dispense Refill   ALPRAZolam  (XANAX ) 0.25 MG tablet Take 1-2 tablets (0.25-0.5 mg total) by mouth 3 (three) times daily as needed for anxiety (Panic attacks). 60 tablet 1   apixaban  (ELIQUIS ) 5 MG TABS tablet Take 1 tablet (5 mg total) by mouth 2 (two) times daily. 60 tablet 0   Azelastine -Fluticasone  137-50 MCG/ACT SUSP Place 1 spray into the nose every 12 (twelve) hours. 69 g 3   citalopram  (CELEXA ) 20 MG tablet 1 po qd 90 tablet 3   diclofenac  Sodium (VOLTAREN ) 1 % GEL Apply 2 g topically daily as needed (for pain).     diltiazem  (CARDIZEM  CD) 120 MG 24 hr capsule Take 1 capsule (120 mg total) by mouth daily. 90 capsule 3   fluticasone  (FLONASE ) 50 MCG/ACT nasal spray Place 2 sprays into both nostrils 2 (two) times daily. 16 g 6   icosapent  Ethyl (VASCEPA ) 1 g capsule Take 2 capsules (2 g total) by mouth 2 (two) times daily. 120 capsule 11   levocetirizine (XYZAL  ALLERGY 24HR) 5 MG tablet Take 1 tablet (5 mg total) by mouth every evening. 90 tablet 3   LUMIFY 0.025 % SOLN SMARTSIG:1 Drop(s) In Eye(s) PRN     omeprazole  (PRILOSEC) 40 MG capsule Take 1 capsule (40 mg total) by mouth daily. 90 capsule 3   ondansetron   (ZOFRAN -ODT) 4 MG disintegrating tablet Take 4 mg by mouth every 8 (eight) hours as needed for nausea or vomiting.     SUMAtriptan  (IMITREX ) 20 MG/ACT nasal spray Place 1 spray (20 mg total) into the nose every 2 (two) hours as needed for migraine or headache. May repeat in 2 hours if headache persists or recurs. 1 each 5   valACYclovir  (VALTREX ) 500 MG tablet TAKE 1 TABLET BY MOUTH TWICE DAILY AS NEEDED FOR  FEVER  BLISTERS.  NEEDS  APPOINTMENT  FOR  MORE  REFILLS 30 tablet 0   zolmitriptan  (ZOMIG ) 5 MG tablet Take 1 tablet (5 mg total) by mouth as needed for migraine. 10 tablet 3   No current facility-administered medications for this visit.     Past Medical History:  Diagnosis Date   Allergic rhinitis    Anxiety    Arthritis    Asthmatic bronchitis    Barrett esophagus    Dry eyes    GERD (gastroesophageal reflux disease)    Headache    prone to migraines   Hepatitis B infection 2008   recovered   Hx of adenomatous polyp of colon 2012   Hypertension    Hypothyroidism 2010   Dr. Gaither Music, been tested since and this is an inacurrate diagnosis   Internal hemorrhoids    MRSA infection    (15 years ago, left neck area)   PONV (postoperative nausea and vomiting)  Ulcerative proctitis (HCC)    Urethritis 2010   Vitamin D  deficiency     Past Surgical History:  Procedure Laterality Date   ANTERIOR CERVICAL DECOMP/DISCECTOMY FUSION N/A 08/31/2020   Procedure: ANTERIOR CERVICAL DECOMPRESSION/DISCECTOMY FUSION, INTERBODY PROSTHESIS, PLATE/SCREWS CERVICAL FIVE-SIX, CERVICAL SIX-SEVEN;  Surgeon: Mavis Purchase, MD;  Location: [facility] OR;  Service: Neurosurgery;  Laterality: N/A;  ANTERIOR CERVICAL DECOMPRESSION/DISCECTOMY FUSION, INTERBODY PROSTHESIS, PLATE/SCREWS CERVICAL FIVE-SIX, CERVICAL SIX-SEVEN   COLONOSCOPY     multiple - adenoma 2012   COSMETIC SURGERY     ESOPHAGOGASTRODUODENOSCOPY     multiple - barrett's   FACIAL COSMETIC SURGERY     HEMORRHOID BANDING     [place]    LIPOSUCTION     SPINE SURGERY     Neck surgery   TEAR DUCT PROBING  2009   UMBILICAL HERNIA REPAIR N/A 11/09/2021   Procedure: OPEN UMBILICAL HERNIA REPAIR WITH MESH;  Surgeon: Vernetta Berg, MD;  Location: [facility];  Service: General;  Laterality: N/A;    Social History   Socioeconomic History   Marital status: Married    Spouse name: Not on file   Number of children: Not on file   Years of education: Not on file   Highest education level: GED or equivalent  Occupational History   Not on file  Tobacco Use   Smoking status: Former    Current packs/day: 0.00    Average packs/day: 1 pack/day for 15.0 years (15.0 ttl pk-yrs)    Types: Cigarettes    Quit date: 06/19/1999    Years since quitting: 25.1   Smokeless tobacco: Never  Vaping Use   Vaping status: Never Used  Substance and Sexual Activity   Alcohol use: Yes    Alcohol/week: 8.0 standard drinks of alcohol    Types: 3 Glasses of wine, 5 Standard drinks or equivalent per week    Comment: a glass of wine each night.   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   Former smoker, 1 alcoholic beverage a day 2 caffeinated beverages a day no drug use or tobacco now   Regular Exercise- yes   Social Drivers of Health   Tobacco Use: Medium Risk (07/14/2024)   Patient History    Smoking Tobacco Use: Former    Smokeless Tobacco Use: Never    Passive Exposure: Not on file  Financial Resource Strain: Low Risk (04/20/2024)   Overall Financial Resource Strain (CARDIA)    Difficulty of Paying Living Expenses: Not hard at all  Food Insecurity: No Food Insecurity (07/10/2024)   Epic    Worried About Radiation Protection Practitioner of Food in the Last Year: Never true    Ran Out of Food in the Last Year: Never true  Transportation Needs: No Transportation Needs  (07/10/2024)   Epic    Lack of Transportation (Medical): No    Lack of Transportation (Non-Medical): No  Physical Activity: Sufficiently Active (04/20/2024)   Exercise Vital Sign    Days of Exercise per Week: 3 days    Minutes of Exercise per Session: 90 min  Stress: No Stress Concern Present (04/20/2024)   [employer] of Occupational Health - Occupational Stress Questionnaire    Feeling of Stress: Not at all  Social Connections: Moderately Isolated (07/10/2024)   Social Connection and  Isolation Panel    Frequency of Communication with Friends and Family: More than three times a week    Frequency of Social Gatherings with Friends and Family: Never    Attends Religious Services: Never    Database Administrator or Organizations: No    Attends Banker Meetings: Never    Marital Status: Married  Catering Manager Violence: Not At Risk (07/10/2024)   Epic    Fear of Current or Ex-Partner: No    Emotionally Abused: No    Physically Abused: No    Sexually Abused: No  Depression (PHQ2-9): Low Risk (04/15/2023)   Depression (PHQ2-9)    PHQ-2 Score: 0  Alcohol Screen: Low Risk (04/20/2024)   Alcohol Screen    Last Alcohol Screening Score (AUDIT): 3  Housing: Low Risk (07/10/2024)   Epic    Unable to Pay for Housing in the Last Year: No    Number of Times Moved in the Last Year: 0    Homeless in the Last Year: No  Utilities: Not At Risk (07/10/2024)   Epic    Threatened with loss of utilities: No  Health Literacy: Not on file    Family History  Problem Relation Age of Onset   Stroke Mother    Lupus Mother    Arthritis Mother    Anxiety disorder Mother    Asthma Mother    Hypertension Mother    Heart disease Paternal Uncle 56       atrial fib   Arthritis Maternal Grandmother    Heart disease Paternal Grandfather 58       MI   Depression Other    Hypertension Other    Stroke Other    Pancreatic cancer Other    Liver cancer Other    Colon cancer Neg Hx     Esophageal cancer Neg Hx    Stomach cancer Neg Hx    Rectal cancer Neg Hx     ROS: no fevers or chills, productive cough, hemoptysis, dysphasia, odynophagia, melena, hematochezia, dysuria, hematuria, rash, seizure activity, orthopnea, PND, pedal edema, claudication. Remaining systems are negative.  Physical Exam: Well-developed well-nourished in no acute distress.  Skin is warm and dry.  HEENT is normal.  Neck is supple.  Chest is clear to auscultation with normal expansion.  Cardiovascular exam is regular rate and rhythm.  Abdominal exam nontender or distended. No masses palpated. Extremities show no edema. neuro grossly intact  ECG- personally reviewed  A/P  1 paroxysmal atrial fibrillation-patient remains in sinus rhythm on examination today.  Awaiting follow-up echocardiogram for LV function.  Continue apixaban  for 4 weeks following recent conversion to sinus rhythm.  CHA2DS2-VASc is 0 and ultimately his anticoagulation will be discontinued.  Will avoid AV nodal blocking agents as he has not tolerated.  Await follow-up results of sleep study.  EP evaluation for consideration of ablation pending.  2 hyperlipidemia-per primary care.  Redell Shallow, MD    "

## 2024-07-24 NOTE — ED Provider Notes (Incomplete)
 " [facility] AT [facility] Provider Note   CSN: [id number] Arrival date & time: 07/24/24  2229     History Chief Complaint  Patient presents with   Palpitations    HPI Glen [name] is a 59 y.o. male presenting for chief complaint of palpitations. States that at 930PM he had severe palpitations.  On Eliquis  and diltiazem  120mg  every day. Only started these 2 weeks ago. Scheduled for echo and ablation with heartcare.  Grade 1 diastolic disease.  Patient's recorded medical, surgical, social, medication list and allergies were reviewed in the Snapshot window as part of the initial history.   Review of Systems   Review of Systems  Physical Exam Updated Vital Signs BP (!) 151/104   Pulse (!) 160   Temp 97.7 F (36.5 C)   Resp 19   SpO2 97%  Physical Exam   ED Course/ Medical Decision Making/ A&P    Procedures .Critical Care  Performed by: Jerral Meth, MD Authorized by: Jerral Meth, MD   Critical care provider statement:    Critical care time (minutes):  30   Critical care was necessary to treat or prevent imminent or life-threatening deterioration of the following conditions:  Circulatory failure and cardiac failure   Critical care was time spent personally by me on the following activities:  Development of treatment plan with patient or surrogate, discussions with consultants, evaluation of patient's response to treatment, examination of patient, ordering and review of laboratory studies, ordering and review of radiographic studies, ordering and performing treatments and interventions, pulse oximetry, re-evaluation of patient's condition and review of old charts Comments:     Ventricular rate >150 requiring IV cardizem .    Medications Ordered in ED Medications - No data to display  Medical Decision Making:   Glen [name] is a 59 y.o. male who presented to the ED today with *** detailed above.     {crccomplexity:27900} Complete initial physical exam performed, notably the patient  was ***.    Reviewed and confirmed nursing documentation for past medical history, family history, social history.    Initial Assessment:   With the patient's presentation of ***, most likely diagnosis is ***. Other diagnoses were considered including (but not limited to) ***. These are considered less likely due to history of present illness and physical exam findings.   {crccopa:27899}  Initial Plan:  ***  ***Screening labs including CBC and Metabolic panel to evaluate for infectious or metabolic etiology of disease.  ***Urinalysis with reflex culture ordered to evaluate for UTI or relevant urologic/nephrologic pathology.  ***CXR to evaluate for structural/infectious intrathoracic pathology.  {crccardiactesting:32591::EKG to evaluate for cardiac pathology} Objective evaluation as below reviewed   Initial Study Results:   Laboratory  All laboratory results reviewed without evidence of clinically relevant pathology.   ***Exceptions include: ***   ***EKG EKG was reviewed independently. Rate, rhythm, axis, intervals all examined and without medically relevant abnormality. ST segments without concerns for elevations.    Radiology:  All images reviewed independently. ***Agree with radiology report at this time.   DG Chest 2 View Result Date: 07/24/2024 EXAM: 2 VIEW(S) XRAY OF THE CHEST 07/24/2024 10:54:00 PM COMPARISON: None available. CLINICAL HISTORY: Chest pain. FINDINGS: LUNGS AND PLEURA: No focal pulmonary opacity. No pleural effusion. No pneumothorax. HEART AND MEDIASTINUM: No acute abnormality of the cardiac and mediastinal silhouettes. BONES AND SOFT TISSUES: Cervical fixation hardware is noted. IMPRESSION: 1. No acute process. Electronically signed by: Franky Crease MD 07/24/2024 10:57 PM  EST RP Workstation: [id number]      Consults: Case discussed with ***.   Reassessment and Plan:   ***     ***  Clinical Impression: No diagnosis found.   Data Unavailable   Final Clinical Impression(s) / ED Diagnoses Final diagnoses:  None    Rx / DC Orders ED Discharge Orders     None       "

## 2024-07-24 NOTE — ED Triage Notes (Signed)
 Patient c/o chest pressure and palpitation after using the bathroom 30 minsg ago. Patient report taking eliquis  and cardizem  for his A fib tonight. Patient denies SOB.

## 2024-08-04 ENCOUNTER — Ambulatory Visit: Payer: Self-pay | Admitting: Emergency Medicine

## 2024-08-04 ENCOUNTER — Ambulatory Visit: Admitting: Cardiology

## 2024-08-05 ENCOUNTER — Ambulatory Visit: Payer: Self-pay | Admitting: Cardiology

## 2024-08-25 ENCOUNTER — Ambulatory Visit (HOSPITAL_COMMUNITY): Payer: Self-pay

## 2024-09-29 ENCOUNTER — Ambulatory Visit: Payer: Self-pay | Admitting: Cardiology

## 2024-11-03 ENCOUNTER — Ambulatory Visit: Payer: Self-pay | Admitting: Cardiology
